# Patient Record
Sex: Male | Born: 1960
Health system: Southern US, Community
[De-identification: ages and names within clinical notes are randomized; demographics above are authoritative.]

## PROBLEM LIST (undated history)

## (undated) DIAGNOSIS — J189 Pneumonia, unspecified organism: Secondary | ICD-10-CM

## (undated) DIAGNOSIS — I10 Essential (primary) hypertension: Secondary | ICD-10-CM

## (undated) DIAGNOSIS — M1990 Unspecified osteoarthritis, unspecified site: Secondary | ICD-10-CM

## (undated) DIAGNOSIS — E119 Type 2 diabetes mellitus without complications: Secondary | ICD-10-CM

## (undated) HISTORY — PX: COLONOSCOPY: SHX174

---

## 2003-04-22 ENCOUNTER — Emergency Department (HOSPITAL_COMMUNITY): Admission: EM | Admit: 2003-04-22 | Discharge: 2003-04-22 | Payer: Self-pay | Admitting: Emergency Medicine

## 2003-11-28 ENCOUNTER — Emergency Department (HOSPITAL_COMMUNITY): Admission: EM | Admit: 2003-11-28 | Discharge: 2003-11-29 | Payer: Self-pay | Admitting: Emergency Medicine

## 2003-12-01 ENCOUNTER — Ambulatory Visit: Payer: Self-pay | Admitting: Internal Medicine

## 2005-08-28 ENCOUNTER — Emergency Department (HOSPITAL_COMMUNITY): Admission: EM | Admit: 2005-08-28 | Discharge: 2005-08-28 | Payer: Self-pay | Admitting: Emergency Medicine

## 2005-10-05 ENCOUNTER — Emergency Department (HOSPITAL_COMMUNITY): Admission: EM | Admit: 2005-10-05 | Discharge: 2005-10-05 | Payer: Self-pay | Admitting: Emergency Medicine

## 2009-02-18 ENCOUNTER — Emergency Department (HOSPITAL_COMMUNITY): Admission: EM | Admit: 2009-02-18 | Discharge: 2009-02-18 | Payer: Self-pay | Admitting: Emergency Medicine

## 2009-03-08 ENCOUNTER — Emergency Department (HOSPITAL_COMMUNITY): Admission: EM | Admit: 2009-03-08 | Discharge: 2009-03-08 | Payer: Self-pay | Admitting: Emergency Medicine

## 2011-01-13 IMAGING — CR DG KNEE COMPLETE 4+V*R*
4 series · 4 of 4 positions shown · non-contrast
Comparison: None.

CLINICAL DATA: Knee injury.  Knee pain and swelling.

RIGHT KNEE - COMPLETE 4+ VIEW

[t knee ap right]
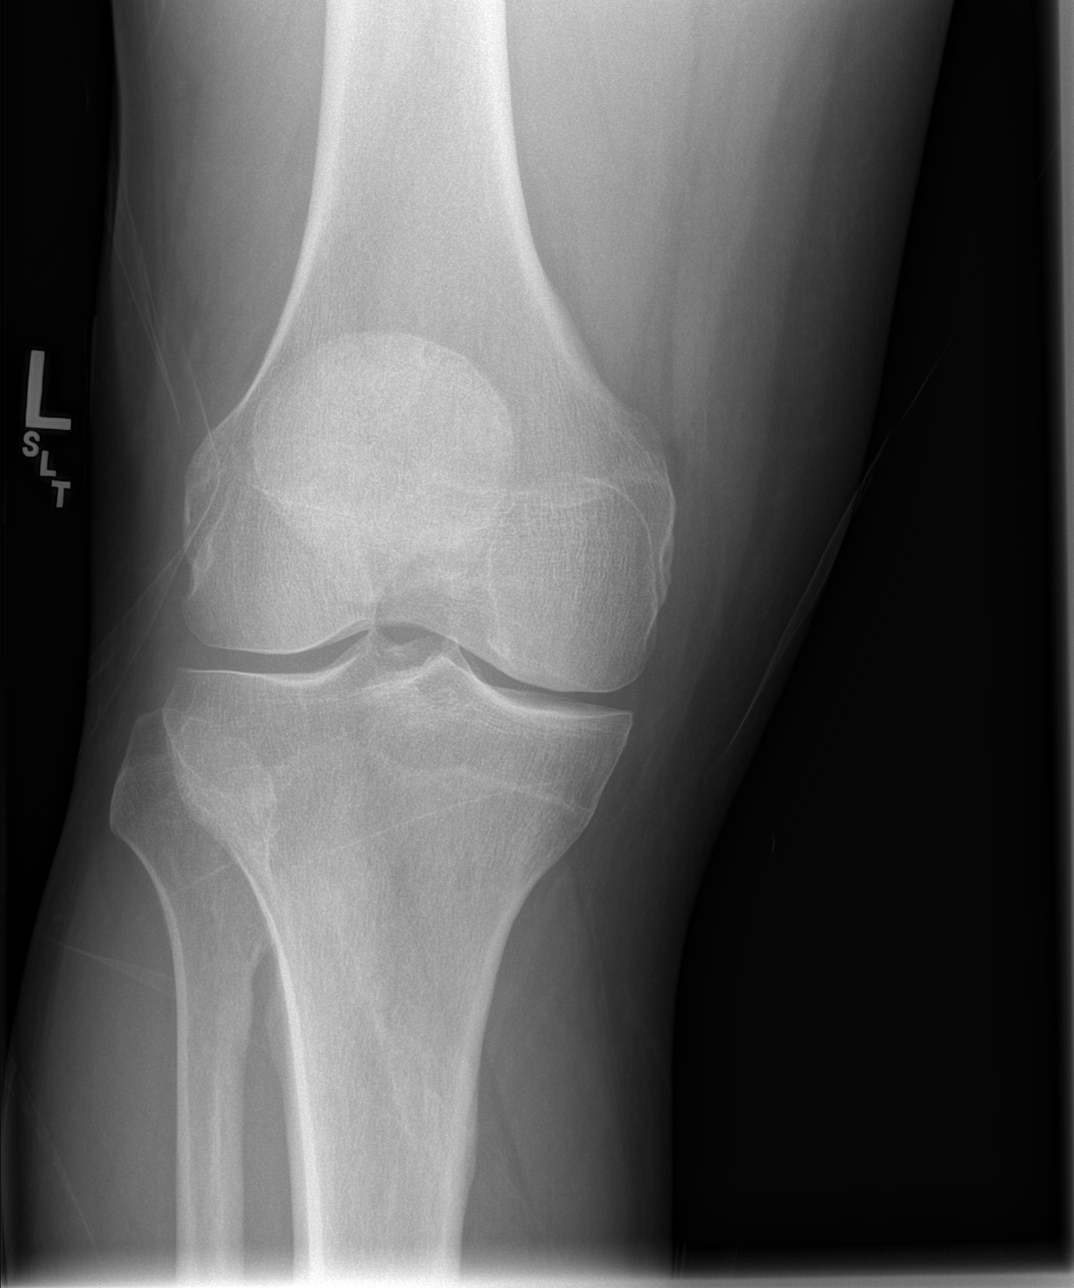

[t knee oblique right (1 of 2)]
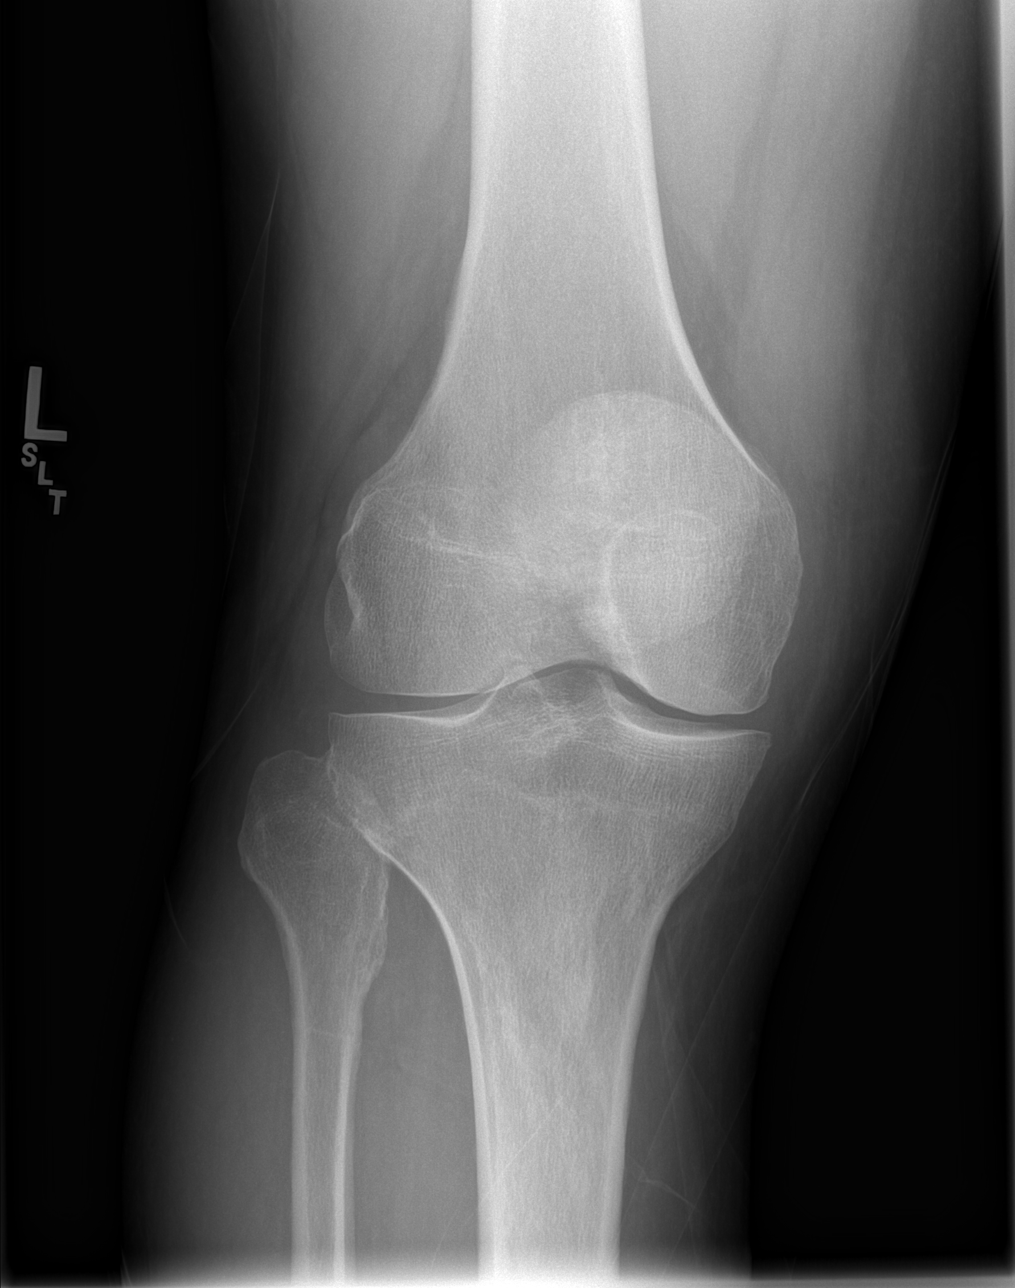

[t knee oblique right (2 of 2)]
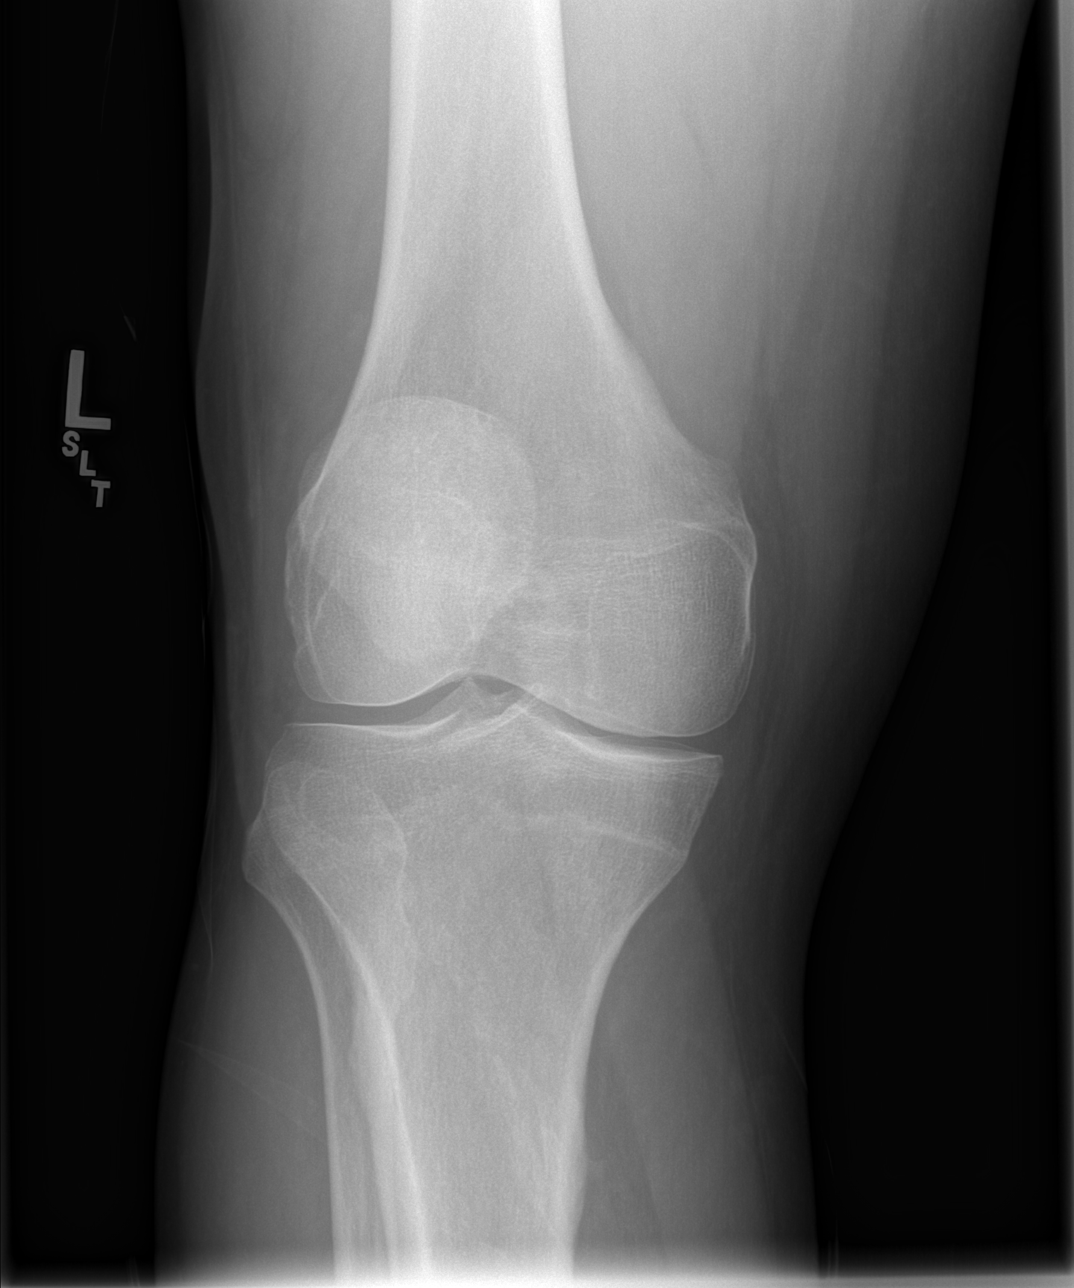

[t knee lat right]
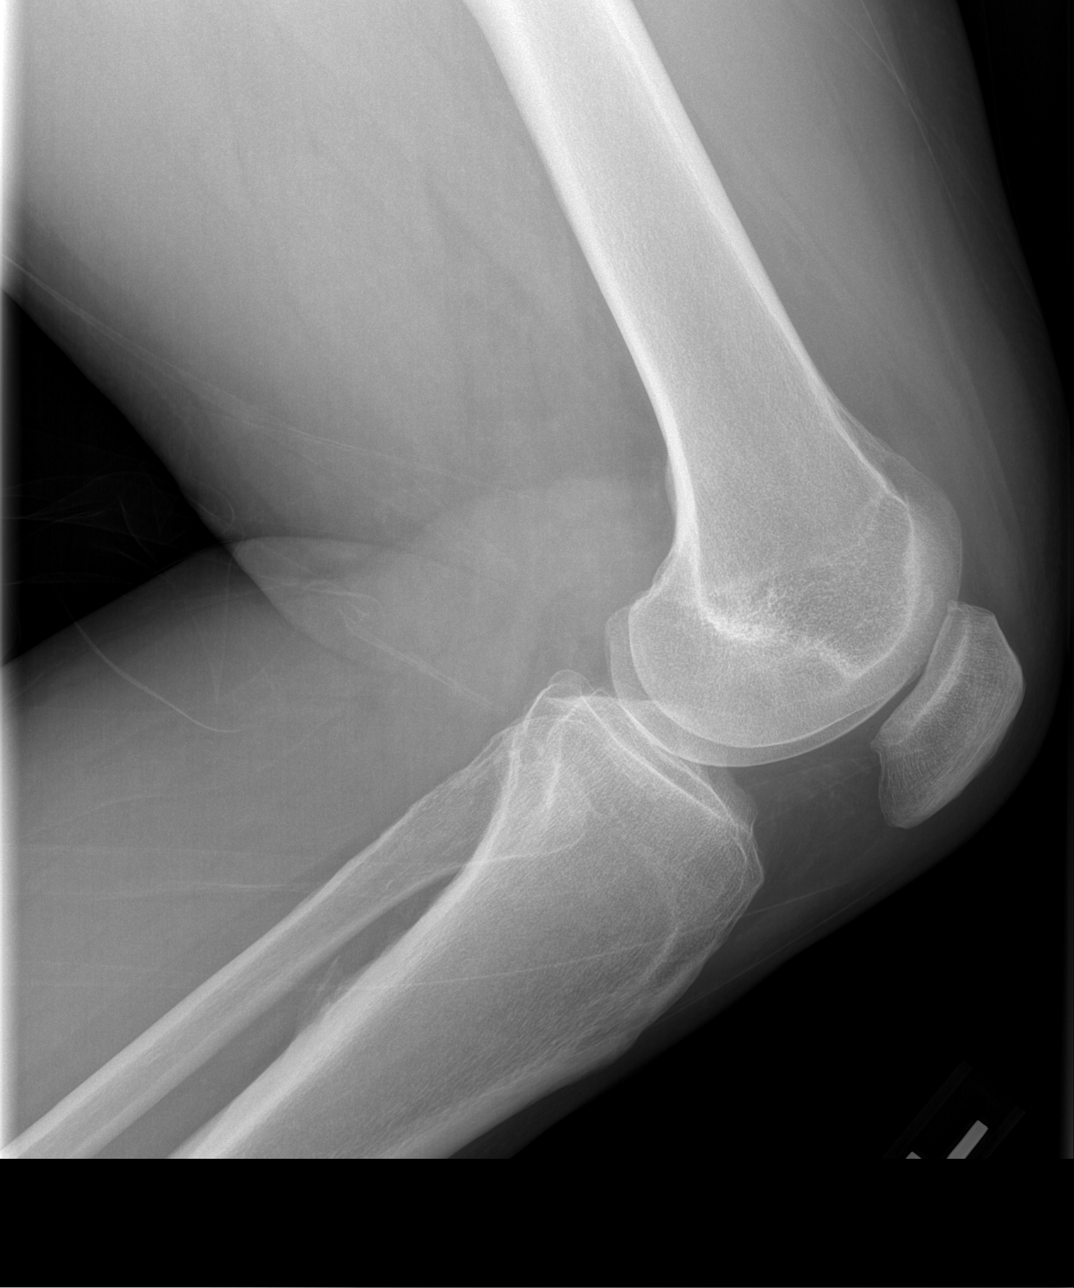

[4 of 4 positions shown; findings below may reference images not displayed]

FINDINGS: There is no evidence of fracture, dislocation, or joint
effusion.  There is no evidence of arthropathy or other focal bone
abnormality.  Soft tissues are unremarkable.
IMPRESSION: Negative.

## 2011-12-25 ENCOUNTER — Emergency Department (HOSPITAL_COMMUNITY): Admission: EM | Admit: 2011-12-25 | Discharge: 2011-12-25 | Discharge status: Absent without leave | Payer: Self-pay

## 2011-12-25 ENCOUNTER — Emergency Department (HOSPITAL_COMMUNITY)
Admission: EM | Admit: 2011-12-25 | Discharge: 2011-12-25 | Discharge status: Absent without leave | Payer: Self-pay | Source: Home / Self Care

## 2011-12-28 ENCOUNTER — Encounter (HOSPITAL_COMMUNITY): Payer: Self-pay

## 2011-12-28 ENCOUNTER — Emergency Department (INDEPENDENT_AMBULATORY_CARE_PROVIDER_SITE_OTHER)
Admission: EM | Admit: 2011-12-28 | Discharge: 2011-12-28 | Disposition: A | Discharge status: Home / Self Care | Payer: Self-pay | Source: Home / Self Care

## 2011-12-28 DIAGNOSIS — Z0000 Encounter for general adult medical examination without abnormal findings: Secondary | ICD-10-CM

## 2011-12-28 DIAGNOSIS — I10 Essential (primary) hypertension: Secondary | ICD-10-CM

## 2011-12-28 LAB — HEMOGLOBIN A1C
Hgb A1c MFr Bld: 5.8 % — ABNORMAL HIGH (ref <5.7)
Mean Plasma Glucose: 120 mg/dL — ABNORMAL HIGH (ref <117)

## 2011-12-28 NOTE — ED Notes (Signed)
Patient states here to establish himself with  Primary doctor.

## 2011-12-28 NOTE — ED Provider Notes (Addendum)
History     CSN: [id number]  Arrival date & time 12/28/11  1516   First MD Initiated Contact with Patient 12/28/11 1527      Chief Complaint  Patient presents with  . SPORTSEXAM     HPI 51 year old with male here for a routine physical. 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 Patient informs that he has recently gained several pounds as he has been sedentary. Patient denies any headache, blurry vision, dry mouth, fever, chills, chest pain, palpitations, shortness of breath, abdominal pain, nausea, vomiting, acid reflux does, bowel or urinary symptoms, joint pains, muscle aches, change in appetite. Patient denies any weakness or feeling fatigued. He has not been watching his diet and eating a lot of junk foods and also has not been exercising regularly.  History reviewed. No pertinent past medical history.  History reviewed. No pertinent past surgical history.  No family history on file.  History  Substance Use Topics  . Smoking status: Never Smoker   . Smokeless tobacco: Not on file  . Alcohol Use: Yes      Review of Systems As outlined in history of present illness Allergies  Review of patient's allergies indicates no known allergies.  Home Medications  No current outpatient prescriptions on file.  BP 154/100  Pulse 80  Temp 97.9 F (36.6 C)  Resp 19  SpO2 99%  Physical Exam Middle-aged overweight male in no acute distress HEENT: No pallor, no icterus, moist oral mucosa, no lymphadenopathy, no thyromegaly Chest: Clear to auscultation bilaterally no added sounds signs he is: Normal S1 and S2 no murmurs rub or gallop Abdomen: Soft, nontender, and nondistended bowel sounds present Extremities: Warm, no edema CNS: AAO x3 ED Course  Procedures (including critical care time)  Labs Reviewed - No data to display No results found.  EKG: NSR@75 , LVH   No diagnosis found.  Patient here for routine physical.  He has gained several pounds over past few months and has been living a very sedentary lifestyle. His blood pressure is elevated on exam today. He has been counseled strongly on diet control and regular exercise to reduce weight. He also informs feeling fatigued frequently. He has not been on any medications. His EKG suggests LVH  I will check routine labs for CBC, BMP, hemoglobin A1c, lipid panel. We'll also get a routine EKG. Her provide him with educational resource on low sodium diet  He needs to followup in one week to have his blood work done and follow up with mom the physicians here to have his blood pressure checked and started on blood pressure medication if needed.  MDM  Follow up in one week for blood pressure monitoring and lab evaluation.        Eddie North, MD 12/28/11 1558  Theda Belfast Emmaleah Meroney, MD 12/28/11 1600

## 2012-01-02 ENCOUNTER — Emergency Department (INDEPENDENT_AMBULATORY_CARE_PROVIDER_SITE_OTHER)
Admission: EM | Admit: 2012-01-02 | Discharge: 2012-01-02 | Disposition: A | Discharge status: Home / Self Care | Payer: No Typology Code available for payment source | Source: Home / Self Care

## 2012-01-02 DIAGNOSIS — I10 Essential (primary) hypertension: Secondary | ICD-10-CM

## 2012-01-02 DIAGNOSIS — Z0000 Encounter for general adult medical examination without abnormal findings: Secondary | ICD-10-CM

## 2012-01-02 LAB — LIPID PANEL
Cholesterol: 196 mg/dL (ref 0–200)
HDL: 37 mg/dL — ABNORMAL LOW (ref >39)
LDL Cholesterol: 130 mg/dL — ABNORMAL HIGH (ref 0–99)
Total CHOL/HDL Ratio: 5.3 RATIO
Triglycerides: 143 mg/dL (ref <150)
VLDL: 29 mg/dL (ref 0–40)

## 2012-01-02 LAB — BASIC METABOLIC PANEL
BUN: 12 mg/dL (ref 6–23)
CO2: 25 mEq/L (ref 19–32)
Calcium: 9.2 mg/dL (ref 8.4–10.5)
Chloride: 99 mEq/L (ref 96–112)
Creatinine, Ser: 0.69 mg/dL (ref 0.50–1.35)
GFR calc Af Amer: >90 mL/min (ref >90)
GFR calc non Af Amer: >90 mL/min (ref >90)
Glucose, Bld: 108 mg/dL — ABNORMAL HIGH (ref 70–99)
Potassium: 3.9 mEq/L (ref 3.5–5.1)
Sodium: 134 mEq/L — ABNORMAL LOW (ref 135–145)

## 2012-01-02 LAB — CBC
HCT: 44.8 % (ref 39.0–52.0)
Hemoglobin: 15.2 g/dL (ref 13.0–17.0)
MCH: 31.9 pg (ref 26.0–34.0)
MCHC: 33.9 g/dL (ref 30.0–36.0)
MCV: 93.9 fL (ref 78.0–100.0)
Platelets: 188 10*3/uL (ref 150–400)
RBC: 4.77 MIL/uL (ref 4.22–5.81)
RDW: 11.5 % (ref 11.5–15.5)
WBC: 4.7 10*3/uL (ref 4.0–10.5)

## 2012-01-02 NOTE — ED Notes (Signed)
Patient was here for bloodwork only

## 2012-03-10 ENCOUNTER — Encounter (HOSPITAL_COMMUNITY): Payer: Self-pay

## 2012-03-10 ENCOUNTER — Emergency Department (INDEPENDENT_AMBULATORY_CARE_PROVIDER_SITE_OTHER)
Admission: EM | Admit: 2012-03-10 | Discharge: 2012-03-10 | Disposition: A | Discharge status: Home / Self Care | Payer: No Typology Code available for payment source | Source: Home / Self Care

## 2012-03-10 DIAGNOSIS — I10 Essential (primary) hypertension: Secondary | ICD-10-CM

## 2012-03-10 DIAGNOSIS — R7309 Other abnormal glucose: Secondary | ICD-10-CM

## 2012-03-10 DIAGNOSIS — R7303 Prediabetes: Secondary | ICD-10-CM

## 2012-03-10 MED ORDER — HYDROCHLOROTHIAZIDE 12.5 MG PO CAPS: 12.5000 mg | ORAL_CAPSULE | Freq: Every day | ORAL | Status: DC

## 2012-03-10 NOTE — ED Provider Notes (Addendum)
History     CSN: [id number]  Arrival date & time 03/10/12  1517   First MD Initiated Contact with Patient 03/10/12 1636      Chief Complaint  Patient presents with  . Follow-up    (Consider location/radiation/quality/duration/timing/severity/associated sxs/prior treatment) HPI  52 year old male who was seen 2 months back in the clinic by me for a routine physical and was found to have elevated blood pressure returns for a followup visit. Patient had blood works done at that time as he is the pressure was quite high and EKG showed signs of LVH. Patient was counseled on low-salt diet and dietary restriction with regular exercise during the visit. He informs that he has tried to cutdown on the salt intake and avoid fatty and fried foods has not succeeded much. His blood pressure is still elevated today. Patient denies any headache, dizziness, blurry vision, chest pain, palpitations, shortness of breath, abdominal pain, nausea, vomiting, bowel or urinary symptoms. Denies any tingling or numbness in his extremities. History reviewed. No pertinent past medical history.  History reviewed. No pertinent past surgical history.  No family history on file.  History  Substance Use Topics  . Smoking status: Never Smoker   . Smokeless tobacco: Not on file  . Alcohol Use: Yes      Review of Systems  Allergies  Review of patient's allergies indicates no known allergies.  Home Medications  No current outpatient prescriptions on file.  BP 159/90  Pulse 97  Temp(Src) 98.1 F (36.7 C) (Oral)  SpO2 98%  Physical Exam Middle aged male in no acute distress HEENT: No pallor, moist oral mucosa Chest: Clear to auscultation bilaterally added sounds CVS: Normal S1 and S2, no murmurs rub or gallop Abdomen: Soft, flabby, nondistended, nontender, bowel sounds present Extremities: Warm, no edema CNS: AAO x3 ED Course  Procedures (including critical care time)  Labs Reviewed - No data to  display No results found.   No diagnosis found.  Assessment  Hypertension Patient has essential hypertension on a subsequent visit. He had LVH on EKG during last evaluation and I had asked him to come for a followup visit within a week time however he did not return at that time. He has tried some dietary restriction without much success. He informs walking 1-2 few times a week. I will start him on HCTZ 12.5 mg daily and have him follow up in the clinic in a week time to monitor his blood pressure. I also instructed him to have his blood pressure checked at [employer] on it the local grocery store is 2 times until next week and keep a log off it. Strongly counseled him to avoid added salt in his diet, avoiding fatty foods, junk foods and avoiding preserved and can foods which are very high sodium content. I will provide him with with some gestational resources well I have encouraged him to exercise  at least 30 minutes every day and try to reduce some weight.  pre diabetes Patient's hemoglobin A1c recently checked was 5.8. I have instructed him taking diet low in carbohydrates and avoiding sweetened beverages, junk foods and limiting fat intake in diet. Patient LDL is 130, no intervention at this time. will repeat lipid panel in 1 month. Patient encouraged to loose weight.    MDM  Followup in one week for blood pressure monitoring        Karely Hurtado, MD 03/10/12 1703  Theda Belfast Irene Mitcham, MD 03/10/12 1704

## 2012-03-10 NOTE — ED Notes (Signed)
Follow up visit Wants a list of proper foods to be eating

## 2012-07-25 ENCOUNTER — Telehealth: Payer: Self-pay

## 2012-07-25 ENCOUNTER — Ambulatory Visit: Payer: No Typology Code available for payment source | Attending: Family Medicine | Admitting: Family Medicine

## 2012-07-25 VITALS — BP 153/87 | HR 72 | Temp 98.1°F | Resp 16 | Ht 71.0 in | Wt 300.2 lb

## 2012-07-25 DIAGNOSIS — R7303 Prediabetes: Secondary | ICD-10-CM

## 2012-07-25 DIAGNOSIS — R030 Elevated blood-pressure reading, without diagnosis of hypertension: Secondary | ICD-10-CM

## 2012-07-25 DIAGNOSIS — E119 Type 2 diabetes mellitus without complications: Secondary | ICD-10-CM | POA: Insufficient documentation

## 2012-07-25 DIAGNOSIS — E8881 Metabolic syndrome: Secondary | ICD-10-CM

## 2012-07-25 DIAGNOSIS — Z09 Encounter for follow-up examination after completed treatment for conditions other than malignant neoplasm: Secondary | ICD-10-CM | POA: Insufficient documentation

## 2012-07-25 DIAGNOSIS — R7309 Other abnormal glucose: Secondary | ICD-10-CM

## 2012-07-25 LAB — COMPLETE METABOLIC PANEL WITH GFR
ALT: 34 U/L (ref 0–53)
AST: 24 U/L (ref 0–37)
Albumin: 4.2 g/dL (ref 3.5–5.2)
Alkaline Phosphatase: 69 U/L (ref 39–117)
Calcium: 9.3 mg/dL (ref 8.4–10.5)
Chloride: 106 mEq/L (ref 96–112)
GFR, Est Non African American: >89 mL/min
Sodium: 140 mEq/L (ref 135–145)
Total Bilirubin: 0.5 mg/dL (ref 0.3–1.2)
Total Protein: 7.4 g/dL (ref 6.0–8.3)

## 2012-07-25 LAB — CBC
Hemoglobin: 13.5 g/dL (ref 13.0–17.0)
MCH: 32.3 pg (ref 26.0–34.0)
MCHC: 35.7 g/dL (ref 30.0–36.0)
MCV: 90.4 fL (ref 78.0–100.0)
Platelets: 256 10*3/uL (ref 150–400)
RDW: 12.8 % (ref 11.5–15.5)

## 2012-07-25 LAB — LIPID PANEL
Cholesterol: 169 mg/dL (ref 0–200)
HDL: 26 mg/dL — ABNORMAL LOW (ref >39)
LDL Cholesterol: 127 mg/dL — ABNORMAL HIGH (ref 0–99)
Total CHOL/HDL Ratio: 6.5 Ratio
VLDL: 16 mg/dL (ref 0–40)

## 2012-07-25 MED ORDER — HYDROCHLOROTHIAZIDE 12.5 MG PO CAPS: 12.5000 mg | ORAL_CAPSULE | Freq: Every day | ORAL | Status: DC

## 2012-07-25 NOTE — Telephone Encounter (Signed)
hemocult kid mailed to patient

## 2012-07-25 NOTE — Telephone Encounter (Signed)
Message copied by Lestine Mount on Fri Jul 25, 2012  4:59 PM ------      Message from: Cleora Fleet      Created: Fri Jul 25, 2012  2:57 PM      Regarding: hemoccult card kit for home       Please get patient a hemoccult card kit to complete at home and bring back in as a colon cancer screening.                   Rodney Langton, MD, CDE, FAAFP      [facility]      [facility]      [place], [place]        ------

## 2012-07-25 NOTE — Progress Notes (Signed)
Patient ID: Brandon Finley, male   DOB: 08-18-1960, 52 y.o.   MRN: [id number]  CC:   Complete physical   HPI: Pt says he had a tetanus booster 2 years ago at another clinic.  Pt says he wants to lose weight and live better.   Pt says that he has started eating better.  He stopped taking his BP meds several months ago.  Pt wants to have a prostate exam done.  Pt needs to have a screening colonoscopy done.   No Known Allergies History reviewed. No pertinent past medical history. No current outpatient prescriptions on file prior to visit.   No current facility-administered medications on file prior to visit.   Family History  Problem Relation Age of Onset  . Hypertension Father    History   Social History  . Marital Status: Single    Spouse Name: N/A    Number of Children: 1  . Years of Education: 12    Occupational History  . Warehouse Worker     Social History Main Topics  . Smoking status: Never Smoker   . Smokeless tobacco: Not on file  . Alcohol Use: No  . Drug Use: No  . Sexually Active: No   Other Topics Concern  . Not on file   Social History Narrative  . No narrative on file    Review of Systems  Constitutional: Negative for fever, chills, diaphoresis, activity change, appetite change and fatigue.  HENT: Negative for ear pain, nosebleeds, congestion, facial swelling, rhinorrhea, neck pain, neck stiffness and ear discharge.   Eyes: Negative for pain, discharge, redness, itching and visual disturbance.  Respiratory: Negative for cough, choking, chest tightness, shortness of breath, wheezing and stridor.   Cardiovascular: Negative for chest pain, palpitations and leg swelling.  Gastrointestinal: Negative for abdominal distention.  Genitourinary: Negative for dysuria, urgency, frequency, hematuria, flank pain, decreased urine volume, difficulty urinating and dyspareunia.  Musculoskeletal: Negative for back pain, joint swelling, arthralgias and gait problem.   Neurological: Negative for dizziness, tremors, seizures, syncope, facial asymmetry, speech difficulty, weakness, light-headedness, numbness and headaches.  Hematological: Negative for adenopathy. Does not bruise/bleed easily.  Psychiatric/Behavioral: Negative for hallucinations, behavioral problems, confusion, dysphoric mood, decreased concentration and agitation.    Objective:   Filed Vitals:   07/25/12 1020  BP: 153/87  Pulse: 72  Temp: 98.1 F (36.7 C)  Resp: 16    Physical Exam  Constitutional: Appears well-developed and well-nourished. No distress.  HENT: Normocephalic. External right and left ear normal. Oropharynx is clear and moist.  Eyes: Conjunctivae and EOM are normal. PERRLA, no scleral icterus.  Neck: Normal ROM. Neck supple. No JVD. No tracheal deviation. No thyromegaly.  CVS: RRR, S1/S2 +, no murmurs, no gallops, no carotid bruit.  Pulmonary: Effort and breath sounds normal, no stridor, rhonchi, wheezes, rales.  Abdominal: Soft. BS +,  no distension, tenderness, rebound or guarding.  Musculoskeletal: Normal range of motion. No edema and no tenderness.  Lymphadenopathy: No lymphadenopathy noted, cervical, inguinal. Neuro: Alert. Normal reflexes, muscle tone coordination. No cranial nerve deficit. Skin: Skin is warm and dry. No rash noted. Not diaphoretic. No erythema. No pallor.  Psychiatric: Normal mood and affect. Behavior, judgment, thought content normal.   Lab Results  Component Value Date   WBC 4.7 01/02/2012   HGB 15.2 01/02/2012   HCT 44.8 01/02/2012   MCV 93.9 01/02/2012   PLT 188 01/02/2012   Lab Results  Component Value Date   CREATININE 0.69 01/02/2012   BUN  12 01/02/2012   NA 134* 01/02/2012   K 3.9 01/02/2012   CL 99 01/02/2012   CO2 25 01/02/2012    Lab Results  Component Value Date   HGBA1C 5.8* 12/28/2011   Lipid Panel     Component Value Date/Time   CHOL 196 01/02/2012 1100   TRIG 143 01/02/2012 1100   HDL 37* 01/02/2012 1100    CHOLHDL 5.3 01/02/2012 1100   VLDL 29 01/02/2012 1100   LDLCALC 130* 01/02/2012 1100       Assessment and plan:   Patient Active Problem List   Diagnosis Date Noted  . Prediabetes 07/25/2012  . Elevated blood pressure 07/25/2012  . Morbid obesity 07/25/2012  . Metabolic syndrome 07/25/2012   Pt was encouraged to start taking his blood pressure medications again.  Gave new prescription for HCTZ 12.5 mg po daily.    Check labs today.    Follow lab results  Exercise 10,000 steps per day... Goal weight loss 20 pounds  The patient was given clear instructions to go to ER or return to medical center if symptoms don't improve, worsen or new problems develop.  The patient verbalized understanding.  The patient was told to call to get any lab results if not heard anything in the next week.    Follow up for BP check in 2 weeks, OV in 3 months  Rodney Langton, MD, CDE, FAAFP [facility] [place], [place]

## 2012-07-25 NOTE — Progress Notes (Signed)
Patient here for a physical Would like prostate checked

## 2012-07-25 NOTE — Patient Instructions (Addendum)
Please get a pedometer and start walking 10,000 steps per day to lose weight and get healthier    2400 Calorie Diet for Diabetes Meal Planning The 2400 calorie diet is designed for eating up to 2400 calories each day. Following this diet and making healthy meal choices can help improve overall health. This diet controls blood sugar (glucose) levels and can also lower blood pressure and cholesterol.  SERVING SIZES Measuring foods and serving sizes helps to make sure you are getting the right amount of food. The list below tells how big or small some common serving sizes are.  1 oz.........4 stacked dice.  3 oz........Marland KitchenDeck of cards.  1 tsp.......Marland KitchenTip of little finger.  1 tbs......Marland KitchenMarland KitchenThumb.  2 tbs.......Marland KitchenGolf ball.   cup......Marland KitchenHalf of a fist.  1 cup.......Marland KitchenA fist. GUIDELINES FOR CHOOSING FOODS The goal of this diet is to eat a variety of foods and limit calories to 2400 each day. This can be done by choosing foods that are low in calories and in fat. The diet also suggests eating small amounts of food often. Doing this helps control your blood glucose levels so they do not get too high or low. Each meal or snack should contain a protein food source to help you feel more satisfied and to stabilize your blood glucose. Try to eat about the same amount of food around the same time each day. This includes weekend days, travel days, and days off work. Space your meals about 4 to 5 hours apart and add a snack between them if you wish.  For example, a daily food plan could include breakfast, a morning snack, lunch, dinner, and an evening snack. Healthy meals and snacks include whole grains, vegetables, fruits, lean meats, poultry, fish, and dairy products. As you plan your meals, choose a variety of foods. Choose from the bread and starches, vegetables, fruit, dairy, and meat/protein groups. Examples of foods from each group are listed below with their suggested serving sizes. Use measuring cups and  spoons to become familiar with what a healthy portion looks like. Bread and Starch Each serving equals 15 grams of carbohydrates.  1 slice bread.   bagel.   cup cold cereal (unsweetened).   cup hot cereal or mashed potatoes.  1 small potato (size of a computer mouse).   cup cooked pasta or rice.   English muffin.  1 cup broth-based soup.  3 cups of popcorn.  4 to 6 whole-wheat crackers.   cup cooked beans, peas, or corn. Vegetable Each serving equals 5 grams of carbohydrates.   cup cooked vegetables.  1 cup raw vegetables.   cup tomato or vegetable juice. Fruit Each serving equals 15 grams of carbohydrates.  1 small apple or orange.  1 cup watermelon or strawberries.   cup applesauce (no sugar added).  2 tbs raisins.   banana.   cup canned fruit, packed in water, in its own juice, or sweetened with a sugar substitute.   cup unsweetened fruit juice. Dairy Each serving equals 12 to 15 grams of carbohydrates.  1 cup fat-free milk.  6 oz artificially sweetened yogurt or plain yogurt.  1 cup low-fat buttermilk.  1 cup soy milk. Meat/Protein  1 large egg.  2 to 3 oz meat, poultry, or fish.   cup low-fat cottage cheese.  1 tbs peanut butter.   cup tofu.  1 oz low-fat cheese.   cup canned tuna in water. Fat  1 tsp oil.  1 tsp trans-fat-free margarine.  1 tsp butter.  1 tsp mayonnaise.  2 tbs avocado. SAMPLE 2400 CALORIE DIET PLAN Breakfast  1 English muffin (2 carb servings).  1 scrambled egg.  1 tsp margarine.  1 cup fat-free milk (1 carb serving).  1 large orange (2 carb servings). Morning Snack   cup low-fat cottage cheese.   cup canned peaches in juice (1 carb serving).  1 cup carrot sticks. Lunch  Grilled chicken salad.  2 oz chicken breast.  1 cup romaine lettuce or spinach.   cup diced tomato.   cup shredded carrots.   cup sliced cucumbers.  2 tbs low-fat salad dressing.  2  slices whole-wheat bread (2 carb servings).  1 small apple (1 carb serving).  1 cup fat-free milk (1 carb serving).  15 baked chips ( 1 carb serving). Afternoon Snack  8 reduced fat crackers (2 carb servings).  2 tbs peanut butter. Dinner  3 oz salmon, broiled.  4 small red potatoes, roasted with 1 tsp olive oil and seasoning (3 carb servings).  1 cup green beans.  1 cup strawberries (1 carb serving).  1 cup fat-free milk (1 carb serving). Evening Snack  6 cups air-popped popcorn (2 carb servings).  2 tbs parmesan cheese sprinkled on top.  8 almonds. MEAL PLAN Use this worksheet to help you make a daily meal plan based on the 2400 calorie diet suggestions. The total amount of carbohydrates in your meal or snack is more important than making sure you include all of the food groups at every meal or snack. If you are using this plan to help you control your blood glucose, you may interchange carbohydrate-containing foods (dairy, starches, and fruits). Choose a variety of fresh foods of varying colors and flavors. You can choose from the following foods to build your day's meals:  12 Starches.  4 Vegetables.  4 Fruits.  3 Dairy.  7 oz Meat/Protein.  Up to 8 Fats. Your dietician can use this worksheet to help you decide how many servings and what types of foods are right for you. BREAKFAST Food Group and Servings / Food Choice Starch ____________________________________________________ Dairy _____________________________________________________ Fruit _____________________________________________________ Meat/Protein ______________________________________________ Fat________________________________________________________ LUNCH Food Group and Servings / Food Choice  Starch ___________________________________________________ Meat/Protein _____________________________________________ Vegetable ________________________________________________ Fruit  _____________________________________________________ Dairy ____________________________________________________ Fat_______________________________________________________ [place] Food Group and Servings / Food Choice Starch ___________________________________________________ Meat/Protein _____________________________________________ [place] Food Group and Servings / Food Choice Starch ___________________________________________________ Meat/Protein _____________________________________________ Dairy ____________________________________________________ Vegetable ________________________________________________ Fruit _____________________________________________________ Fat_______________________________________________________ [place] Food Group and Servings / Food Choice Fruit _____________________________________________________ Meat/Protein ______________________________________________ Starch ____________________________________________________ DAILY TOTALS Starch __________________________ Vegetable _______________________ Fruits ___________________________ Dairy ___________________________ Meat/Protein ____________________ Fat _____________________________ Document Released: 07/24/2004 Document Revised: 03/26/2011 Document Reviewed: 11/17/2010 [employer] Patient Information 2014 [employer]. Exercise to Lose Weight Exercise and a healthy diet may help you lose weight. Your doctor may suggest specific exercises. EXERCISE IDEAS AND TIPS  Choose low-cost things you enjoy doing, such as walking, bicycling, or exercising to workout videos.  Take stairs instead of the elevator.  Walk during your lunch break.  Park your car further away from work or school.  Go to a gym or an exercise class.  Start with 5 to 10 minutes of exercise each day. Build up to 30 minutes of exercise 4 to 6 days a week.  Wear shoes with good support and comfortable  clothes.  Stretch before and after working out.  Work out until you breathe harder and your heart beats faster.  Drink extra water when you exercise.  Do not do  so much that you hurt yourself, feel dizzy, or get very short of breath. Exercises that burn about 150 calories:  Running 1  miles in 15 minutes.  Playing volleyball for 45 to 60 minutes.  Washing and waxing a car for 45 to 60 minutes.  Playing touch football for 45 minutes.  Walking 1  miles in 35 minutes.  Pushing a stroller 1  miles in 30 minutes.  Playing basketball for 30 minutes.  Raking leaves for 30 minutes.  Bicycling 5 miles in 30 minutes.  Walking 2 miles in 30 minutes.  Dancing for 30 minutes.  Shoveling snow for 15 minutes.  Swimming laps for 20 minutes.  Walking up stairs for 15 minutes.  Bicycling 4 miles in 15 minutes.  Gardening for 30 to 45 minutes.  Jumping rope for 15 minutes.  Washing windows or floors for 45 to 60 minutes. Document Released: 02/03/2010 Document Revised: 03/26/2011 Document Reviewed: 02/03/2010 [facility] Patient Information 2014 [place], [place]. Metabolic Syndrome, Adult Metabolic syndrome descibes a group of risk factors for heart disease and diabetes. This syndrome has other names including Insulin Resistance Syndrome. The more risk factors you have, the higher your risk of having a heart attack, stroke, or developing diabetes. These risk factors include:  High blood sugar.  High blood triglyceride (a fat found in the blood) level.  High blood pressure.  Abdominal obesity (your extra weight is around your waist instead of your hips).  Low levels of high-density lipoprotein, HDL (good blood cholesterol). If you have any three of these risk factors, you have metabolic syndrome. If you have even one of these factors, you should make lifestyle changes to improve your health in order to prevent serious health diseases.  In people with metabolic syndrome, the  cells do not respond properly to insulin. This can lead to high levels of glucose in the blood, which can interfere with normal body processes. Eventually, this can cause high blood pressure and higher fat levels in the blood, and inflammation of your blood vessels. The result can be heart disease and stroke.  CAUSES   Eating a diet rich in calories and saturated fat.  Too little physical activity.  Being overweight. Other underlying causes are:  Family history (genetics).  Ethnicity (South Asians are at a higher risk).  Older age (your chances of developing metabolic syndrome are higher as you grow older).  Insulin resistance. SYMPTOMS  By itself, metabolic syndrome has no symptoms. However, you might have symptoms of diabetes (high blood sugar) or high blood pressure, such as:  Increased thirst, urination, and tiredness.  Dizzy spells.  Dull headaches that are unusual for you.  Blurred vision.  Nosebleeds. DIAGNOSIS  Your caregiver may make a diagnosis of metabolic syndrome if you have at least three of these factors:  If you are overweight mostly around the waist. This means a waistline greater than 40" in men and more than 35" in women. The waistline limits are 31 to 35 inches for women and 37 to 39 inches for men. In those who have certain genetic risk factors, such as having a family history of diabetes or being of Asian descent.  If you have a blood pressure of 130/85 mm Hg or more, or if you are being treated for high blood pressure.  If your blood triglyceride level is 150 mg/dL or more, or you are being treated for high levels of triglyceride.  If the level of HDL in your blood is below 40 mg/dL in men,  less than 50 mg/dL in women, or you are receiving treatment for low levels of HDL.  If the level of sugar in your blood is high with fasting blood sugar level of 110 mg/dL or more, or you are under treatment for diabetes. TREATMENT  Your caregiver may have you make  lifestyle changes, which may include:  Exercise.  Losing weight.  Maintaining a healthy diet.  Quitting smoking. The lifestyle changes listed above are key in reducing your risk for heart disease and stroke. Medicines may also be prescribed to help your body respond to insulin better and to reduce your blood pressure and blood fat levels. Aspirin may be recommended to reduce risks of heart disease or stroke.  HOME CARE INSTRUCTIONS   Exercise.  Measure your waist at regular intervals just above the hipbones after you have breathed out.  Maintain a healthy diet.  Eat fruits, such as apples, oranges, and pears.  Eat vegetables.  Eat legumes, such as kidney beans, peas, and lentils.  Eat food rich in soluble fiber, such as whole grain cereal, oatmeal, and oat bran.  Use olive or safflower oils and avoid saturated fats.  Eat nuts.  Limit the amount of salt you eat or add to food.  Limit the amount of alcohol you drink.  Include fish in your diet, if possible.  Stop smoking if you are a smoker.  Maintain regular follow-up appointments.  Follow your caregiver's advice. SEEK MEDICAL CARE IF:   You feel very tired or fatigued.  You develop excessive thirst.  You pass large quantities of urine.  You are putting on weight around your waist rather than losing weight.  You develop headaches over and over again.  You have off-and-on dizzy spells. SEEK IMMEDIATE MEDICAL CARE IF:   You develop nosebleeds.  You develop sudden blurred vision.  You develop sudden dizzy spells.  You develop chest pains, trouble breathing, or feel an abnormal or irregular heart beat.  You have a fainting episode.  You develop any sudden trouble speaking and/or swallowing.  You develop sudden weakness in one arm and/or one leg. MAKE SURE YOU:   Understand these instructions.  Will watch your condition.  Will get help right away if you are not doing well or get worse. Document  Released: 04/10/2007 Document Revised: 03/26/2011 Document Reviewed: 04/10/2007 [facility] Patient Information 2014 [place], [place]. DASH Diet The DASH diet stands for "Dietary Approaches to Stop Hypertension." It is a healthy eating plan that has been shown to reduce high blood pressure (hypertension) in as little as 14 days, while also possibly providing other significant health benefits. These other health benefits include reducing the risk of breast cancer after menopause and reducing the risk of type 2 diabetes, heart disease, colon cancer, and stroke. Health benefits also include weight loss and slowing kidney failure in patients with chronic kidney disease.  DIET GUIDELINES  Limit salt (sodium). Your diet should contain less than 1500 mg of sodium daily.  Limit refined or processed carbohydrates. Your diet should include mostly whole grains. Desserts and added sugars should be used sparingly.  Include small amounts of heart-healthy fats. These types of fats include nuts, oils, and tub margarine. Limit saturated and trans fats. These fats have been shown to be harmful in the body. CHOOSING FOODS  The following food groups are based on a 2000 calorie diet. See your Registered Dietitian for individual calorie needs. Grains and Grain Products (6 to 8 servings daily)  Eat More Often: Whole-wheat bread, brown  rice, whole-grain or wheat pasta, quinoa, popcorn without added fat or salt (air popped).  Eat Less Often: White bread, white pasta, white rice, cornbread. Vegetables (4 to 5 servings daily)  Eat More Often: Fresh, frozen, and canned vegetables. Vegetables may be raw, steamed, roasted, or grilled with a minimal amount of fat.  Eat Less Often/Avoid: Creamed or fried vegetables. Vegetables in a cheese sauce. Fruit (4 to 5 servings daily)  Eat More Often: All fresh, canned (in natural juice), or frozen fruits. Dried fruits without added sugar. One hundred percent fruit juice ( cup [237 mL]  daily).  Eat Less Often: Dried fruits with added sugar. Canned fruit in light or heavy syrup. [employer], Fish, and Poultry (2 servings or less daily. One serving is 3 to 4 oz [85-114 g]).  Eat More Often: Ninety percent or leaner ground beef, tenderloin, sirloin. Round cuts of beef, chicken breast, [place] breast. All fish. Grill, bake, or broil your meat. Nothing should be fried.  Eat Less Often/Avoid: Fatty cuts of meat, [place], or chicken leg, thigh, or wing. Fried cuts of meat or fish. Dairy (2 to 3 servings)  Eat More Often: Low-fat or fat-free milk, low-fat plain or light yogurt, reduced-fat or part-skim cheese.  Eat Less Often/Avoid: Milk (whole, 2%).Whole milk yogurt. Full-fat cheeses. Nuts, Seeds, and Legumes (4 to 5 servings per week)  Eat More Often: All without added salt.  Eat Less Often/Avoid: Salted nuts and seeds, canned beans with added salt. Fats and Sweets (limited)  Eat More Often: Vegetable oils, tub margarines without trans fats, sugar-free gelatin. Mayonnaise and salad dressings.  Eat Less Often/Avoid: Coconut oils, palm oils, butter, stick margarine, cream, half and half, cookies, candy, pie. FOR MORE INFORMATION The Dash Diet Eating Plan: www.dashdiet.org Document Released: 12/21/2010 Document Revised: 03/26/2011 Document Reviewed: 12/21/2010 [facility] Patient Information 2014 [place], [place]. Hypertension As your heart beats, it forces blood through your arteries. This force is your blood pressure. If the pressure is too high, it is called hypertension (HTN) or high blood pressure. HTN is dangerous because you may have it and not know it. High blood pressure may mean that your heart has to work harder to pump blood. Your arteries may be narrow or stiff. The extra work puts you at risk for heart disease, stroke, and other problems.  Blood pressure consists of two numbers, a higher number over a lower, 110/72, for example. It is stated as "110 over 72." The ideal  is below 120 for the top number (systolic) and under 80 for the bottom (diastolic). Write down your blood pressure today. You should pay close attention to your blood pressure if you have certain conditions such as:  Heart failure.  Prior heart attack.  Diabetes  Chronic kidney disease.  Prior stroke.  Multiple risk factors for heart disease. To see if you have HTN, your blood pressure should be measured while you are seated with your arm held at the level of the heart. It should be measured at least twice. A one-time elevated blood pressure reading (especially in the Emergency Department) does not mean that you need treatment. There may be conditions in which the blood pressure is different between your right and left arms. It is important to see your caregiver soon for a recheck. Most people have essential hypertension which means that there is not a specific cause. This type of high blood pressure may be lowered by changing lifestyle factors such as:  Stress.  Smoking.  Lack of exercise.  Excessive weight.  Drug/tobacco/alcohol use.  Eating less salt. Most people do not have symptoms from high blood pressure until it has caused damage to the body. Effective treatment can often prevent, delay or reduce that damage. TREATMENT  When a cause has been identified, treatment for high blood pressure is directed at the cause. There are a large number of medications to treat HTN. These fall into several categories, and your caregiver will help you select the medicines that are best for you. Medications may have side effects. You should review side effects with your caregiver. If your blood pressure stays high after you have made lifestyle changes or started on medicines,   Your medication(s) may need to be changed.  Other problems may need to be addressed.  Be certain you understand your prescriptions, and know how and when to take your medicine.  Be sure to follow up with your  caregiver within the time frame advised (usually within two weeks) to have your blood pressure rechecked and to review your medications.  If you are taking more than one medicine to lower your blood pressure, make sure you know how and at what times they should be taken. Taking two medicines at the same time can result in blood pressure that is too low. SEEK IMMEDIATE MEDICAL CARE IF:  You develop a severe headache, blurred or changing vision, or confusion.  You have unusual weakness or numbness, or a faint feeling.  You have severe chest or abdominal pain, vomiting, or breathing problems. MAKE SURE YOU:   Understand these instructions.  Will watch your condition.  Will get help right away if you are not doing well or get worse. Document Released: 01/01/2005 Document Revised: 03/26/2011 Document Reviewed: 08/22/2007 [facility] Patient Information 2014 [place], [place].

## 2012-07-26 LAB — VITAMIN D 25 HYDROXY (VIT D DEFICIENCY, FRACTURES): Vit D, 25-Hydroxy: 29 ng/mL — ABNORMAL LOW (ref 30–89)

## 2012-07-28 ENCOUNTER — Telehealth: Payer: Self-pay

## 2012-07-28 NOTE — Progress Notes (Signed)
Quick Note:  Please inform patient that labs came back OK.   C. L. Johnson, MD, CDE, FAAFP [facility] [employer] [place], [place]   ______ 

## 2012-07-28 NOTE — Telephone Encounter (Signed)
Left message on machine.

## 2012-07-28 NOTE — Telephone Encounter (Signed)
Message copied by Lestine Mount on Mon Jul 28, 2012  8:38 AM ------      Message from: Cleora Fleet      Created: Mon Jul 28, 2012  7:17 AM       Please inform patient that labs came back OK.              Rodney Langton, MD, CDE, FAAFP      [facility]      [facility]      [place], [place]        ------

## 2012-07-30 ENCOUNTER — Ambulatory Visit: Payer: No Typology Code available for payment source

## 2012-08-15 ENCOUNTER — Ambulatory Visit: Payer: Self-pay

## 2012-08-22 ENCOUNTER — Ambulatory Visit: Payer: Self-pay

## 2012-10-08 ENCOUNTER — Encounter: Payer: Self-pay | Admitting: *Deleted

## 2013-12-07 ENCOUNTER — Ambulatory Visit: Payer: Self-pay

## 2014-01-12 ENCOUNTER — Ambulatory Visit: Payer: Self-pay

## 2014-05-14 ENCOUNTER — Encounter: Payer: Self-pay | Admitting: Internal Medicine

## 2014-07-20 ENCOUNTER — Encounter: Payer: Self-pay | Admitting: Gastroenterology

## 2014-07-23 ENCOUNTER — Encounter: Payer: Self-pay | Admitting: Internal Medicine

## 2014-08-20 ENCOUNTER — Encounter: Payer: Self-pay | Admitting: Gastroenterology

## 2014-08-25 ENCOUNTER — Encounter: Payer: Self-pay | Admitting: Gastroenterology

## 2014-09-28 ENCOUNTER — Encounter: Payer: Self-pay | Admitting: Gastroenterology

## 2015-05-16 ENCOUNTER — Ambulatory Visit: Payer: Self-pay

## 2015-05-18 ENCOUNTER — Ambulatory Visit: Payer: Self-pay | Attending: Internal Medicine

## 2015-05-27 ENCOUNTER — Ambulatory Visit: Payer: Self-pay

## 2015-06-06 ENCOUNTER — Ambulatory Visit: Payer: Self-pay

## 2015-06-15 ENCOUNTER — Ambulatory Visit: Payer: Self-pay

## 2015-06-27 ENCOUNTER — Ambulatory Visit: Payer: Self-pay

## 2015-07-06 ENCOUNTER — Ambulatory Visit: Payer: Self-pay

## 2015-08-01 ENCOUNTER — Emergency Department (HOSPITAL_COMMUNITY)
Admission: EM | Admit: 2015-08-01 | Discharge: 2015-08-01 | Disposition: A | Discharge status: Home / Self Care | Payer: Self-pay | Attending: Emergency Medicine | Admitting: Emergency Medicine

## 2015-08-01 ENCOUNTER — Encounter (HOSPITAL_COMMUNITY): Payer: Self-pay | Admitting: Emergency Medicine

## 2015-08-01 ENCOUNTER — Ambulatory Visit: Payer: Self-pay

## 2015-08-01 DIAGNOSIS — M25551 Pain in right hip: Secondary | ICD-10-CM

## 2015-08-01 MED ORDER — KETOROLAC TROMETHAMINE 30 MG/ML IJ SOLN
30.0000 mg | Freq: Once | INTRAMUSCULAR | Status: AC
  Administered 2015-08-01: 30 mg via INTRAMUSCULAR
  Filled 2015-08-01: qty 1

## 2015-08-01 MED ORDER — NAPROXEN 500 MG PO TABS: 500.0000 mg | ORAL_TABLET | Freq: Two times a day (BID) | ORAL | Status: DC

## 2015-08-01 MED ORDER — DEXAMETHASONE SODIUM PHOSPHATE 10 MG/ML IJ SOLN
10.0000 mg | Freq: Once | INTRAMUSCULAR | Status: AC
  Administered 2015-08-01: 10 mg via INTRAMUSCULAR
  Filled 2015-08-01: qty 1

## 2015-08-01 NOTE — ED Provider Notes (Signed)
History  By signing my name below, I, Brandon Finley, attest that this documentation has been prepared under the direction and in the presence of Sealed Air CorporationHeather Hrithik Boschee, PA-C. Electronically Signed: Earmon PhoenixJennifer Finley, ED Scribe. 08/01/2015. 2:09 PM.  Chief Complaint  Patient presents with  . Hip Pain   The history is provided by the patient and medical records. No language interpreter was used.    HPI Comments:  Brandon Finley is an obese 55 y.o. male who presents to the Emergency Department complaining of sharp right hip pain that has been ongoing for one year. He states the pain has been worsening for the past 6 months and was even worse today. He has taken Tramadol and Ibuprofen with no significant relief. Resting increases his pain and sometimes walking exacerbates the pain as well. He denies alleviating factors. He denies numbness, tingling or weakness of the lower extremities, back pain, fever, chills. Pt is able to ambulate without difficulty or assistance. He denies any trauma, injury or fall. He states he has been diagnosed with arthritis in the right hip by Dr. Bruna Finley. He denies h/o kidney problems.  History reviewed. No pertinent past medical history. History reviewed. No pertinent past surgical history. Family History  Problem Relation Age of Onset  . Hypertension Father    Social History  Substance Use Topics  . Smoking status: Never Smoker   . Smokeless tobacco: None  . Alcohol Use: No    Review of Systems A complete 10 system review of systems was obtained and all systems are negative except as noted in the HPI and PMH.   Allergies  Review of patient's allergies indicates no known allergies.  Home Medications   Prior to Admission medications   Medication Sig Start Date End Date Taking? Authorizing Provider  hydrochlorothiazide (MICROZIDE) 12.5 MG capsule Take 1 capsule (12.5 mg total) by mouth daily. 07/25/12   Clanford Brandon MullL Johnson, MD   Triage Vitals: BP 133/93 mmHg   Pulse 74  Temp(Src) 98.3 F (36.8 C)  Resp 18  Ht 6\' 1"  (1.854 m)  Wt 275 lb (124.739 kg)  BMI 36.29 kg/m2  SpO2 97% Physical Exam  Constitutional: He is oriented to person, place, and time. He appears well-developed and well-nourished.  HENT:  Head: Normocephalic and atraumatic.  Eyes: EOM are normal.  Neck: Normal range of motion.  Cardiovascular: Normal rate, regular rhythm and normal heart sounds.  Exam reveals no gallop and no friction rub.   No murmur heard. DP pulses of right foot 2+.  Pulmonary/Chest: Effort normal and breath sounds normal. No respiratory distress. He has no wheezes. He has no rales.  Musculoskeletal: Normal range of motion.  Tenderness to palpation of right SI joint. No erythema, edema or warmth. Full ROM of right hip. No tenderness to palpation of thoracic or lumbar spine.  Neurological: He is alert and oriented to person, place, and time.  Distal sensations of right foot intact.  Skin: Skin is warm and dry.  Psychiatric: He has a normal mood and affect. His behavior is normal.  Nursing note and vitals reviewed.   ED Course  Procedures (including critical care time) DIAGNOSTIC STUDIES: Oxygen Saturation is 97% on RA, normal by my interpretation.   COORDINATION OF CARE: 2:05 PM- Will give injection of Toradol and Decadron prior to discharge and refer to orthopedist. Pt verbalizes understanding and agrees to plan.  Medications  dexamethasone (DECADRON) injection 10 mg (not administered)  ketorolac (TORADOL) 30 MG/ML injection 30 mg (not administered)  Labs Review Labs Reviewed - No data to display  Imaging Review No results found. I have personally reviewed and evaluated these images and lab results as part of my medical decision-making.   EKG Interpretation None      MDM   Final diagnoses:  None    Patient with right SI joint pain.  No neurological deficits and normal neuro exam.  Patient is ambulatory.  No loss of bowel or  bladder control.  No concern for cauda equina.  No fever, night sweats, weight loss, h/o cancer, IVDA, no recent procedure to back. No urinary symptoms suggestive of UTI.  Supportive care and return precaution discussed. Appears safe for discharge at this time. Follow up as indicated in discharge paperwork.    I personally performed the services described in this documentation, which was scribed in my presence. The recorded information has been reviewed and is accurate.     Brandon Glad, PA-C 08/01/15 1637  Brandon Barrette, MD 08/02/15 [phone]

## 2015-08-01 NOTE — ED Notes (Signed)
Pt reports over a year of right hip pain, denies injury. Pt was seen for this one year ago but did not follow up. Pain 8/10. Pt reports able to walk. 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

## 2015-08-01 NOTE — ED Notes (Signed)
Declined W/C at D/C and was escorted to lobby by RN. 

## 2015-08-05 ENCOUNTER — Encounter (HOSPITAL_COMMUNITY): Payer: Self-pay | Admitting: Emergency Medicine

## 2015-08-05 ENCOUNTER — Emergency Department (HOSPITAL_COMMUNITY)
Admission: EM | Admit: 2015-08-05 | Discharge: 2015-08-05 | Disposition: A | Discharge status: Home / Self Care | Payer: Self-pay | Attending: Emergency Medicine | Admitting: Emergency Medicine

## 2015-08-05 DIAGNOSIS — M1611 Unilateral primary osteoarthritis, right hip: Secondary | ICD-10-CM | POA: Insufficient documentation

## 2015-08-05 DIAGNOSIS — M2550 Pain in unspecified joint: Secondary | ICD-10-CM

## 2015-08-05 DIAGNOSIS — M25551 Pain in right hip: Secondary | ICD-10-CM | POA: Insufficient documentation

## 2015-08-05 DIAGNOSIS — G8929 Other chronic pain: Secondary | ICD-10-CM | POA: Insufficient documentation

## 2015-08-05 HISTORY — DX: Unspecified osteoarthritis, unspecified site: M19.90

## 2015-08-05 MED ORDER — ACETAMINOPHEN 325 MG PO TABS: 650.0000 mg | ORAL_TABLET | Freq: Four times a day (QID) | ORAL | Status: DC | PRN

## 2015-08-05 MED ORDER — KETOROLAC TROMETHAMINE 30 MG/ML IJ SOLN
30.0000 mg | Freq: Once | INTRAMUSCULAR | Status: AC
  Administered 2015-08-05: 30 mg via INTRAMUSCULAR
  Filled 2015-08-05: qty 1

## 2015-08-05 NOTE — Discharge Instructions (Signed)
Follow up with [facility] as soon as you can. Have your primary care provider refer you to an orthopedist.

## 2015-08-05 NOTE — ED Notes (Signed)
Pt c/o R hip pain, pt ambulatory, pt hx of arthritis.

## 2015-08-05 NOTE — ED Provider Notes (Signed)
CSN: [id number]     Arrival date & time 08/05/15  1351 History  By signing my name below, I, Essence Howell, attest that this documentation has been prepared under the direction and in the presence of Noelle PennerSerena Kailo Kosik, PA-C Electronically Signed: Charline BillsEssence Howell, ED Scribe 08/05/2015 at 2:41 PM.   Chief Complaint  Patient presents with  . Hip Pain   The history is provided by the patient. No language interpreter was used.   HPI Comments: Brandon Finley is a 55 y.o. male who presents to the Emergency Department complaining of ongoing sharp, stabbing right hip pain for the past year, worsening over the past few months. Pt denies fall or injury. He was seen in the ED last week for the same. He was treated with Toradol and Decadron injections which he stated provided temporary relief. He did not fill his naproxen rx. Pt was also advised to follow-up with ortho but states that he is unable to until be receives his orange card and has an appointment with the [facility]. Denies numbness, weakness, tingling.  Past Medical History  Diagnosis Date  . Arthritis    History reviewed. No pertinent past surgical history. Family History  Problem Relation Age of Onset  . Hypertension Father    Social History  Substance Use Topics  . Smoking status: Never Smoker   . Smokeless tobacco: None  . Alcohol Use: No    Review of Systems  Musculoskeletal: Positive for arthralgias.  Neurological: Negative for numbness.  All other systems reviewed and are negative.  Allergies  Review of patient's allergies indicates no known allergies.  Home Medications   Prior to Admission medications   Medication Sig Start Date End Date Taking? Authorizing Provider  hydrochlorothiazide (MICROZIDE) 12.5 MG capsule Take 1 capsule (12.5 mg total) by mouth daily. 07/25/12   Clanford Cyndie MullL Johnson, MD  naproxen (NAPROSYN) 500 MG tablet Take 1 tablet (500 mg total) by mouth 2 (two) times daily. 08/01/15   Heather Laisure, PA-C    BP 118/76 mmHg  Pulse 78  Temp(Src) 98.2 F (36.8 C) (Oral)  Resp 16  Ht 6\' 1"  (1.854 m)  Wt 270 lb (122.471 kg)  BMI 35.63 kg/m2  SpO2 96% Physical Exam  Constitutional: He is oriented to person, place, and time. He appears well-developed and well-nourished. No distress.  HENT:  Head: Normocephalic and atraumatic.  Eyes: Conjunctivae and EOM are normal.  Neck: Neck supple. No tracheal deviation present.  Cardiovascular: Normal rate.   Pulmonary/Chest: Effort normal. No respiratory distress.  Musculoskeletal: Normal range of motion.  Lateral R hip tenderness to palpation  No posterior back tenderness No edema or discoloration  2+ peripheral pulses   Neurological: He is alert and oriented to person, place, and time.  Skin: Skin is warm and dry.  Psychiatric: He has a normal mood and affect. His behavior is normal.  Nursing note and vitals reviewed.  ED Course  Procedures (including critical care time) DIAGNOSTIC STUDIES: Oxygen Saturation is 96% on RA, normal by my interpretation.    COORDINATION OF CARE: 2:12 PM-Discussed treatment plan which includes Toradol injection with pt at bedside and pt agreed to plan.   MDM   Final diagnoses:  Arthritic-like pain  Right hip pain    Pt here with acute on chronic right hip pain. He has been diagnosed with arthritis in the past. No new injury or trauma. Will treat with toradol today. encouraegd to pick up the naproxen he was prescribed. Also given rx for  tylenol. Encouraged f/u with [facility] as soon as possible and to obtain ortho referral then. ER return precautions given.   I personally performed the services described in this documentation, which was scribed in my presence. The recorded information has been reviewed and is accurate.   Carlene Coria, PA-C 08/05/15 1450   Loren Racer, MD 08/21/15 [phone]

## 2015-08-05 NOTE — ED Notes (Signed)
See PA note for secondary assessment.   

## 2015-08-08 ENCOUNTER — Ambulatory Visit: Payer: Self-pay

## 2015-08-10 ENCOUNTER — Ambulatory Visit: Payer: Self-pay | Attending: Internal Medicine

## 2015-08-15 ENCOUNTER — Emergency Department (HOSPITAL_COMMUNITY)
Admission: EM | Admit: 2015-08-15 | Discharge: 2015-08-15 | Disposition: A | Discharge status: Home / Self Care | Payer: Self-pay | Attending: Emergency Medicine | Admitting: Emergency Medicine

## 2015-08-15 ENCOUNTER — Encounter (HOSPITAL_COMMUNITY): Payer: Self-pay | Admitting: Emergency Medicine

## 2015-08-15 DIAGNOSIS — Y929 Unspecified place or not applicable: Secondary | ICD-10-CM | POA: Insufficient documentation

## 2015-08-15 DIAGNOSIS — Y999 Unspecified external cause status: Secondary | ICD-10-CM | POA: Insufficient documentation

## 2015-08-15 DIAGNOSIS — Y93F2 Activity, caregiving, lifting: Secondary | ICD-10-CM | POA: Insufficient documentation

## 2015-08-15 DIAGNOSIS — X500XXA Overexertion from strenuous movement or load, initial encounter: Secondary | ICD-10-CM | POA: Insufficient documentation

## 2015-08-15 DIAGNOSIS — S39012A Strain of muscle, fascia and tendon of lower back, initial encounter: Secondary | ICD-10-CM | POA: Insufficient documentation

## 2015-08-15 MED ORDER — HYDROCODONE-ACETAMINOPHEN 5-325 MG PO TABS: 1.0000 | ORAL_TABLET | Freq: Four times a day (QID) | ORAL | 0 refills | Status: DC | PRN

## 2015-08-15 MED ORDER — NAPROXEN 500 MG PO TABS: 500.0000 mg | ORAL_TABLET | Freq: Two times a day (BID) | ORAL | 0 refills | Status: DC

## 2015-08-15 MED ORDER — DEXAMETHASONE SODIUM PHOSPHATE 10 MG/ML IJ SOLN
10.0000 mg | Freq: Once | INTRAMUSCULAR | Status: AC
Start: 2015-08-15 — End: 2015-08-15
  Administered 2015-08-15: 10 mg via INTRAMUSCULAR
  Filled 2015-08-15: qty 1

## 2015-08-15 MED ORDER — CYCLOBENZAPRINE HCL 10 MG PO TABS: 10.0000 mg | ORAL_TABLET | Freq: Two times a day (BID) | ORAL | 0 refills | Status: DC | PRN

## 2015-08-15 MED ORDER — KETOROLAC TROMETHAMINE 60 MG/2ML IM SOLN
60.0000 mg | Freq: Once | INTRAMUSCULAR | Status: AC
  Administered 2015-08-15: 60 mg via INTRAMUSCULAR
  Filled 2015-08-15: qty 2

## 2015-08-15 NOTE — ED Triage Notes (Signed)
Pt reports he was moving furniture on Friday and hurt his back. Pt ambulatory in triage. Pt reports pain to lumbar spine. No obvious defomities or swelling noted.

## 2015-08-15 NOTE — ED Provider Notes (Signed)
[facility] DEPT Provider Note   CSN: [id number] Arrival date & time: 08/15/15  1442  First Provider Contact:  First PA Initiated Contact with Patient 08/15/15 1709     By signing my name below, I, Linna Darner, attest that this documentation has been prepared under the direction and in the presence of Danelle Berry, PA-C. Electronically Signed: Linna Darner, Scribe. 08/15/2015. 5:10 PM.   History   Chief Complaint Chief Complaint  Patient presents with  . Back Pain    The history is provided by the patient. No language interpreter was used.     HPI Comments: Brandon Finley is a 55 y.o. male with PMHx of arthritis who presents to the Emergency Department complaining of sudden onset, constant, worsening, aching, 8/10, midline lower back pain beginning three days ago. Pt reports he lifted heavy furniture three days ago and hurt his lower back. He endorses a sensation of tightness in his lower back. Pt also notes some pain in his bilateral hips since the incident; he states he was diagnosed with right hip arthritis several months ago. Pt states his lower back pain does not shoot down his legs. He states his lower back pain is most significant when he stands up after sitting or laying down. He denies h/o back injury, cancer, IV drug use, or prolonged steroid use. He further denies fever, numbness/tingling, saddle anesthesia, cauda equina symptoms, bowel/bladder incontinence, neuro deficits, or any other associated symptoms. He has an appointment with an orthopedist at [facility] in 2 days for his right hip problems, and intends to follow-up about his lower back pain as well.  Past Medical History:  Diagnosis Date  . Arthritis     Patient Active Problem List   Diagnosis Date Noted  . Prediabetes 07/25/2012  . Elevated blood pressure 07/25/2012  . Morbid obesity (HCC) 07/25/2012  . Metabolic syndrome 07/25/2012    No past surgical history on  file.     Home Medications    Prior to Admission medications   Medication Sig Start Date End Date Taking? Authorizing Provider  acetaminophen (TYLENOL) 325 MG tablet Take 2 tablets (650 mg total) by mouth every 6 (six) hours as needed. 08/05/15   Ace Gins Sam, PA-C  cyclobenzaprine (FLEXERIL) 10 MG tablet Take 1 tablet (10 mg total) by mouth 2 (two) times daily as needed for muscle spasms. 08/15/15   Danelle Berry, PA-C  hydrochlorothiazide (MICROZIDE) 12.5 MG capsule Take 1 capsule (12.5 mg total) by mouth daily. 07/25/12   Clanford Cyndie Mull, MD  HYDROcodone-acetaminophen (NORCO/VICODIN) 5-325 MG tablet Take 1-2 tablets by mouth every 6 (six) hours as needed for severe pain. 08/15/15   Danelle Berry, PA-C  naproxen (NAPROSYN) 500 MG tablet Take 1 tablet (500 mg total) by mouth 2 (two) times daily with a meal. 08/15/15   Danelle Berry, PA-C    Family History Family History  Problem Relation Age of Onset  . Hypertension Father     Social History Social History  Substance Use Topics  . Smoking status: Never Smoker  . Smokeless tobacco: Not on file  . Alcohol use No     Allergies   Review of patient's allergies indicates no known allergies.   Review of Systems Review of Systems  All other systems reviewed and are negative.  Physical Exam Updated Vital Signs BP 141/91 (BP Location: Left Arm)   Pulse 84   Temp 98.6 F (37 C) (Oral)   Resp 18   Ht 6\' 1"  (1.854  m)   Wt 124.7 kg   SpO2 100%   BMI 36.28 kg/m   Physical Exam  Constitutional: He is oriented to person, place, and time. He appears well-developed and well-nourished. No distress.  HENT:  Head: Normocephalic and atraumatic.  Eyes: Conjunctivae and EOM are normal.  Neck: Neck supple. No tracheal deviation present.  Cardiovascular: Normal rate.   Pulmonary/Chest: Effort normal. No respiratory distress.  Musculoskeletal: Normal range of motion.  Low lumbar to sacral region: Paraspinal tenderness. No midline  tenderness. No step off. Normal dorsal and plantar flexion.  Neurological: He is alert and oriented to person, place, and time.  Normal sensation and strength bilaterally in lower extremities.  Skin: Skin is warm and dry.  Psychiatric: He has a normal mood and affect. His behavior is normal.  Nursing note and vitals reviewed.   ED Treatments / Results  Labs (all labs ordered are listed, but only abnormal results are displayed) Labs Reviewed - No data to display  EKG  EKG Interpretation None       Radiology No results found.  Procedures Procedures (including critical care time)  DIAGNOSTIC STUDIES: Oxygen Saturation is 100% on RA, normal by my interpretation.    COORDINATION OF CARE: 5:10 PM Discussed treatment plan with pt at bedside and pt agreed to plan.   Medications Ordered in ED Medications  ketorolac (TORADOL) injection 60 mg (60 mg Intramuscular Given 08/15/15 1803)  dexamethasone (DECADRON) injection 10 mg (10 mg Intramuscular Given 08/15/15 1803)     Initial Impression / Assessment and Plan / ED Course  I have reviewed the triage vital signs and the nursing notes.  Pertinent labs & imaging results that were available during my care of the patient were reviewed by me and considered in my medical decision making (see chart for details).  Clinical Course   Pt with lumbar strain after lifting heavy object three days ago.  Patient with back pain.  No neurological deficits and normal neuro exam.  Patient can walk but states is painful.  No loss of bowel or bladder control.  No concern for cauda equina.  No fever, night sweats, weight loss, h/o cancer, IVDU.  RICE protocol and pain medicine indicated and discussed with patient.  He will follow up at his upcoming appointment.  I personally performed the services described in this documentation, which was scribed in my presence. The recorded information has been reviewed and is accurate.    Final Clinical  Impressions(s) / ED Diagnoses   Final diagnoses:  Lumbar spine strain, initial encounter    New Prescriptions Discharge Medication List as of 08/15/2015  5:19 PM    START taking these medications   Details  cyclobenzaprine (FLEXERIL) 10 MG tablet Take 1 tablet (10 mg total) by mouth 2 (two) times daily as needed for muscle spasms., Starting Mon 08/15/2015, Print    HYDROcodone-acetaminophen (NORCO/VICODIN) 5-325 MG tablet Take 1-2 tablets by mouth every 6 (six) hours as needed for severe pain., Starting Mon 08/15/2015, Print         Danelle Berry, PA-C 08/15/15 1817    Raeford Razor, MD 08/18/15 2150

## 2015-08-15 NOTE — ED Notes (Signed)
Declined W/C at D/C and was escorted to lobby by RN. 

## 2015-08-19 ENCOUNTER — Emergency Department (HOSPITAL_COMMUNITY): Payer: Self-pay

## 2015-08-19 ENCOUNTER — Encounter (HOSPITAL_COMMUNITY): Payer: Self-pay | Admitting: *Deleted

## 2015-08-19 ENCOUNTER — Emergency Department (HOSPITAL_COMMUNITY)
Admission: EM | Admit: 2015-08-19 | Discharge: 2015-08-19 | Disposition: A | Discharge status: Home / Self Care | Payer: Self-pay | Attending: Emergency Medicine | Admitting: Emergency Medicine

## 2015-08-19 DIAGNOSIS — Z87891 Personal history of nicotine dependence: Secondary | ICD-10-CM | POA: Insufficient documentation

## 2015-08-19 DIAGNOSIS — M1611 Unilateral primary osteoarthritis, right hip: Secondary | ICD-10-CM | POA: Insufficient documentation

## 2015-08-19 DIAGNOSIS — M1610 Unilateral primary osteoarthritis, unspecified hip: Secondary | ICD-10-CM

## 2015-08-19 MED ORDER — KETOROLAC TROMETHAMINE 60 MG/2ML IM SOLN
60.0000 mg | Freq: Once | INTRAMUSCULAR | Status: AC
  Administered 2015-08-19: 60 mg via INTRAMUSCULAR
  Filled 2015-08-19: qty 2

## 2015-08-19 MED ORDER — MELOXICAM 15 MG PO TABS: 15.0000 mg | ORAL_TABLET | Freq: Every day | ORAL | 0 refills | Status: DC

## 2015-08-19 MED ORDER — DEXAMETHASONE SODIUM PHOSPHATE 10 MG/ML IJ SOLN
10.0000 mg | Freq: Once | INTRAMUSCULAR | Status: AC
  Administered 2015-08-19: 10 mg via INTRAMUSCULAR
  Filled 2015-08-19: qty 1

## 2015-08-19 MED ORDER — OXYCODONE-ACETAMINOPHEN 5-325 MG PO TABS: 1.0000 | ORAL_TABLET | ORAL | 0 refills | Status: DC | PRN

## 2015-08-19 MED FILL — MELOXICAM 15 MG TABLET: 15 | 30 days supply | Qty: 30 | Fill #0

## 2015-08-19 NOTE — ED Notes (Signed)
Returned from xray

## 2015-08-19 NOTE — ED Triage Notes (Signed)
Pt c/o chronic bil lower back pain & R hip pain, pt reports [facility] has an appt next week for eval, pt ambulatory, pt reports being seen here last wk for similar symptoms, A&O x4

## 2015-08-19 NOTE — ED Notes (Addendum)
Charting error.

## 2015-08-19 NOTE — ED Provider Notes (Signed)
[facility] Provider Note   CSN: [id number] Arrival date & time: 08/19/15  1047  First Provider Contact:  First MD Initiated Contact with Patient 08/19/15 1114     By signing my name below, I, Essence Howell, attest that this documentation has been prepared under the direction and in the presence of Arthor Captain, PA-C Electronically Signed: Charline Bills, ED Scribe 08/19/2015 at 12:43 PM.   History   Chief Complaint No chief complaint on file.  HPI KNIGHTLY MARKELL is a 55 y.o. male, with a h/o arthritis in the right hip, who presents to the Emergency Department complaining of gradually worsening low back and right hip pain for the past year. Pt reports increased pain with bearing weight and movement. No treatments tried PTA. He has tried Toradol and Decadron injections with temporary relief. Pt has an upcoming appointment with an orthopedist at [facility] on 08/30/15 for right hip and low back pain. No h/o DM or HTN.   The history is provided by the patient. No language interpreter was used.   Past Medical History:  Diagnosis Date  . Arthritis     Patient Active Problem List   Diagnosis Date Noted  . Prediabetes 07/25/2012  . Elevated blood pressure 07/25/2012  . Morbid obesity (HCC) 07/25/2012  . Metabolic syndrome 07/25/2012    History reviewed. No pertinent surgical history.   Home Medications    Prior to Admission medications   Medication Sig Start Date End Date Taking? Authorizing Provider  acetaminophen (TYLENOL) 325 MG tablet Take 2 tablets (650 mg total) by mouth every 6 (six) hours as needed. 08/05/15   Ace Gins Sam, PA-C  cyclobenzaprine (FLEXERIL) 10 MG tablet Take 1 tablet (10 mg total) by mouth 2 (two) times daily as needed for muscle spasms. 08/15/15   Danelle Berry, PA-C  hydrochlorothiazide (MICROZIDE) 12.5 MG capsule Take 1 capsule (12.5 mg total) by mouth daily. 07/25/12   Clanford Cyndie Mull, MD  HYDROcodone-acetaminophen  (NORCO/VICODIN) 5-325 MG tablet Take 1-2 tablets by mouth every 6 (six) hours as needed for severe pain. 08/15/15   Danelle Berry, PA-C  naproxen (NAPROSYN) 500 MG tablet Take 1 tablet (500 mg total) by mouth 2 (two) times daily with a meal. 08/15/15   Danelle Berry, PA-C    Family History Family History  Problem Relation Age of Onset  . Hypertension Father     Social History Social History  Substance Use Topics  . Smoking status: Former Smoker    Quit date: 01/16/1995  . Smokeless tobacco: Never Used  . Alcohol use No    Allergies   Review of patient's allergies indicates no known allergies.   Review of Systems Review of Systems  Constitutional: Negative for fever.  Musculoskeletal: Positive for arthralgias and back pain.  Neurological: Negative for numbness.    Physical Exam Updated Vital Signs BP 147/84 (BP Location: Right Arm)   Pulse 71   Temp 97.9 F (36.6 C) (Oral)   Resp 20   Wt 126.8 kg   SpO2 97%   BMI 36.88 kg/m   Physical Exam  Constitutional: He is oriented to person, place, and time. He appears well-developed and well-nourished. No distress.  HENT:  Head: Normocephalic and atraumatic.  Eyes: Conjunctivae and EOM are normal.  Neck: Neck supple. No tracheal deviation present.  Cardiovascular: Normal rate and normal pulses.   Pulmonary/Chest: Effort normal. No respiratory distress.  Musculoskeletal: Normal range of motion.  Limited ROM of R hip due to pain  Neurological: He is alert and oriented to person, place, and time.  Skin: Skin is warm and dry.  Psychiatric: He has a normal mood and affect. His behavior is normal.  Nursing note and vitals reviewed.   ED Treatments / Results  Labs (all labs ordered are listed, but only abnormal results are displayed) Labs Reviewed - No data to display  EKG  EKG Interpretation None       Radiology Dg Lumbar Spine Complete  Result Date: 08/19/2015 CLINICAL DATA:  55 year old male with chronic progressive  pain. Initial encounter. EXAM: LUMBAR SPINE - COMPLETE 4+ VIEW COMPARISON:  CT Abdomen and Pelvis 11/28/2003. Right hip series from today reported separately. FINDINGS: Hip findings are reported separately. Normal lumbar segmentation. Bone mineralization is within normal limits. Diffuse lower thoracic and lumbar degenerative endplate spurring. Lower lumbar disc spaces are fairly preserved. There is lower thoracic and upper lumbar disc space loss. Mild to moderate superimposed facet hypertrophy. No pars fracture. Chronic degenerative SI joint overgrowth. No acute osseous abnormality identified. IMPRESSION: Widespread lower thoracic and lumbar endplate and facet degeneration. No acute osseous abnormality identified. Electronically Signed   By: Odessa Fleming M.D.   On: 08/19/2015 12:16   Dg Hip Unilat W Or Wo Pelvis 2-3 Views Right  Result Date: 08/19/2015 CLINICAL DATA:  55 year old male with progressive chronic right hip pain. Initial encounter. EXAM: DG HIP (WITH OR WITHOUT PELVIS) 2-3V RIGHT COMPARISON:  CT Abdomen and Pelvis 11/28/2003. FINDINGS: Severe right hip joint space loss with acetabular and femoral head subchondral sclerosis and geodes. Moderate associated osteophytosis. The appearance does not strongly suggest avascular necrosis. These changes have progressed since 2005. Proximal right femur intact. Left femoral head normally located. The left hip joint space appears more stable since 2005. Pelvis intact. Chronic degenerative overgrowth at both SI joints appear stable. Pelvic phleboliths. IMPRESSION: 1. Severe right hip osteoarthritis, progressed since 2005. 2. More stable degenerative changes at the left hip and SI joints. Electronically Signed   By: Odessa Fleming M.D.   On: 08/19/2015 12:17   Procedures Procedures (including critical care time) DIAGNOSTIC STUDIES: Oxygen Saturation is 97% on RA, normal by my interpretation.    COORDINATION OF CARE: 12:35 PM-Discussed treatment plan which includes XR  with pt at bedside and pt agreed to plan.   Medications Ordered in ED Medications  ketorolac (TORADOL) injection 60 mg (not administered)  dexamethasone (DECADRON) injection 10 mg (not administered)     Initial Impression / Assessment and Plan / ED Course  I have reviewed the triage vital signs and the nursing notes.  Pertinent labs & imaging results that were available during my care of the patient were reviewed by me and considered in my medical decision making (see chart for details).  Clinical Course    Patient with severe OA of the R hip. Bone on bone degeneration with subchondral sclerosis. Will se an orthopedist on 08/30/2015. Will give NSAIDs, Pain meds to be used sparingly and only when pain is intolerable. Patient needs a cane.   Final Clinical Impressions(s) / ED Diagnoses   Final diagnoses:  Osteoarthritis of one hip, unspecified osteoarthritis type   I reviewed the NCCSRS and patient has no listed medications.  New Prescriptions Discharge Medication List as of 08/19/2015  1:16 PM    START taking these medications   Details  meloxicam (MOBIC) 15 MG tablet Take 1 tablet (15 mg total) by mouth daily. Take 1 daily with food., Starting Fri 08/19/2015, Print    oxyCODONE-acetaminophen (PERCOCET)  5-325 MG tablet Take 1-2 tablets by mouth every 4 (four) hours as needed., Starting Fri 08/19/2015, Print         Arthor Captain, PA-C 08/19/15 1627    Shaune Pollack, MD 08/21/15 [phone]

## 2015-08-19 NOTE — ED Notes (Signed)
Here 7/31 for back pain. Returns today for continued LBP. Has appointment with Ortho 8/15.

## 2015-08-19 NOTE — ED Notes (Signed)
Helped patient get into a gown

## 2015-08-30 ENCOUNTER — Encounter: Payer: Self-pay | Admitting: Internal Medicine

## 2015-08-30 ENCOUNTER — Ambulatory Visit: Payer: Medicaid Other | Attending: Internal Medicine | Admitting: Internal Medicine

## 2015-08-30 VITALS — BP 128/82 | HR 79 | Temp 98.2°F | Resp 16 | Wt 285.4 lb

## 2015-08-30 DIAGNOSIS — M25551 Pain in right hip: Secondary | ICD-10-CM | POA: Diagnosis not present

## 2015-08-30 DIAGNOSIS — H6121 Impacted cerumen, right ear: Secondary | ICD-10-CM

## 2015-08-30 DIAGNOSIS — M1611 Unilateral primary osteoarthritis, right hip: Secondary | ICD-10-CM

## 2015-08-30 MED ORDER — NAPROXEN 500 MG PO TABS: 500.0000 mg | ORAL_TABLET | Freq: Two times a day (BID) | ORAL | 0 refills | Status: DC

## 2015-08-30 MED ORDER — CARBAMIDE PEROXIDE 6.5 % OT SOLN: 5.0000 [drp] | Freq: Two times a day (BID) | OTIC | 0 refills | Status: DC

## 2015-08-30 MED ORDER — CYCLOBENZAPRINE HCL 10 MG PO TABS: 10.0000 mg | ORAL_TABLET | Freq: Two times a day (BID) | ORAL | 0 refills | Status: DC | PRN

## 2015-08-30 MED FILL — NAPROXEN 500 MG TABLET: 500 | 15 days supply | Qty: 30 | Fill #0

## 2015-08-30 MED FILL — ?CYCLOBENZAPRINE 10 MG TABL: 10 | 10 days supply | Qty: 20 | Fill #0

## 2015-08-30 MED FILL — EAR DROPS 6.5%: 6.5 | 15 days supply | Qty: 15 | Fill #0

## 2015-08-30 NOTE — Patient Instructions (Addendum)
Financial aid /orange card   Osteoarthritis Osteoarthritis is a disease that causes soreness and inflammation of a joint. It occurs when the cartilage at the affected joint wears down. Cartilage acts as a cushion, covering the ends of bones where they meet to form a joint. Osteoarthritis is the most common form of arthritis. It often occurs in older people. The joints affected most often by this condition include those in the:  Ends of the fingers.  Thumbs.  Neck.  Lower back.  Knees.  Hips. CAUSES  Over time, the cartilage that covers the ends of bones begins to wear away. This causes bone to rub on bone, producing pain and stiffness in the affected joints.  RISK FACTORS Certain factors can increase your chances of having osteoarthritis, including:  Older age.  Excessive body weight.  Overuse of joints.  Previous joint injury. SIGNS AND SYMPTOMS   Pain, swelling, and stiffness in the joint.  Over time, the joint may lose its normal shape.  Small deposits of bone (osteophytes) may grow on the edges of the joint.  Bits of bone or cartilage can break off and float inside the joint space. This may cause more pain and damage. DIAGNOSIS  Your health care provider will do a physical exam and ask about your symptoms. Various tests may be ordered, such as:  X-rays of the affected joint.  Blood tests to rule out other types of arthritis. Additional tests may be used to diagnose your condition. TREATMENT  Goals of treatment are to control pain and improve joint function. Treatment plans may include:  A prescribed exercise program that allows for rest and joint relief.  A weight control plan.  Pain relief techniques, such as:  Properly applied heat and cold.  Electric pulses delivered to nerve endings under the skin (transcutaneous electrical nerve stimulation [TENS]).  Massage.  Certain nutritional supplements.  Medicines to control pain, such  as:  Acetaminophen.  Nonsteroidal anti-inflammatory drugs (NSAIDs), such as naproxen.  Narcotic or central-acting agents, such as tramadol.  Corticosteroids. These can be given orally or as an injection.  Surgery to reposition the bones and relieve pain (osteotomy) or to remove loose pieces of bone and cartilage. Joint replacement may be needed in advanced states of osteoarthritis. HOME CARE INSTRUCTIONS   Take medicines only as directed by your health care provider.  Maintain a healthy weight. Follow your health care provider's instructions for weight control. This may include dietary instructions.  Exercise as directed. Your health care provider can recommend specific types of exercise. These may include:  Strengthening exercises. These are done to strengthen the muscles that support joints affected by arthritis. They can be performed with weights or with exercise bands to add resistance.  Aerobic activities. These are exercises, such as brisk walking or low-impact aerobics, that get your heart pumping.  Range-of-motion activities. These keep your joints limber.  Balance and agility exercises. These help you maintain daily living skills.  Rest your affected joints as directed by your health care provider.  Keep all follow-up visits as directed by your health care provider. SEEK MEDICAL CARE IF:   Your skin turns red.  You develop a rash in addition to your joint pain.  You have worsening joint pain.  You have a fever along with joint or muscle aches. SEEK IMMEDIATE MEDICAL CARE IF:  You have a significant loss of weight or appetite.  You have night sweats. FOR MORE INFORMATION   National Institute of Arthritis and Musculoskeletal and Skin  Diseases: www.niams.http://www.myers.net/nih.gov  [employer] on Aging: https://walker.com/www.nia.nih.gov  American College of Rheumatology: www.rheumatology.org   This information is not intended to replace advice given to you by your health care provider.  Make sure you discuss any questions you have with your health care provider.   Document Released: 01/01/2005 Document Revised: 01/22/2014 Document Reviewed: 09/08/2012 Elsevier Interactive Patient Education [employer]2016 Elsevier Inc.

## 2015-08-30 NOTE — Progress Notes (Signed)
Pt is in the office today for establish Pt states his pain level today in the office today 8 Pt states his pain is coming from his right hip and back Pt states he has been taking tramadol and it has not been helping
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
## 2015-09-07 ENCOUNTER — Ambulatory Visit: Payer: Self-pay | Admitting: Student

## 2015-09-14 ENCOUNTER — Ambulatory Visit (INDEPENDENT_AMBULATORY_CARE_PROVIDER_SITE_OTHER): Payer: Medicaid Other | Admitting: Student

## 2015-09-14 ENCOUNTER — Encounter: Payer: Self-pay | Admitting: Student

## 2015-09-14 VITALS — BP 138/85 | HR 84 | Ht 73.0 in | Wt 280.0 lb

## 2015-09-14 DIAGNOSIS — M25551 Pain in right hip: Secondary | ICD-10-CM

## 2015-09-14 DIAGNOSIS — M1651 Unilateral post-traumatic osteoarthritis, right hip: Secondary | ICD-10-CM
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
## 2015-09-14 NOTE — Assessment & Plan Note (Signed)
Has severe osteoarthritis of right hip. Discussed that injections will likely not help stop the pain. Would recommend hip replacement. Referral placed a [facility] for hip replacement as it is covered by his insurance.

## 2015-10-19 ENCOUNTER — Ambulatory Visit (INDEPENDENT_AMBULATORY_CARE_PROVIDER_SITE_OTHER): Payer: Self-pay | Admitting: Physician Assistant

## 2015-10-31 ENCOUNTER — Ambulatory Visit (INDEPENDENT_AMBULATORY_CARE_PROVIDER_SITE_OTHER): Payer: Self-pay | Admitting: Physician Assistant

## 2015-10-31 DIAGNOSIS — M25551 Pain in right hip: Secondary | ICD-10-CM

## 2015-10-31 DIAGNOSIS — M1611 Unilateral primary osteoarthritis, right hip: Secondary | ICD-10-CM

## 2015-11-11 ENCOUNTER — Emergency Department (HOSPITAL_COMMUNITY)
Admission: EM | Admit: 2015-11-11 | Discharge: 2015-11-11 | Disposition: A | Discharge status: Home / Self Care | Payer: Self-pay | Attending: Emergency Medicine | Admitting: Emergency Medicine

## 2015-11-11 ENCOUNTER — Encounter (HOSPITAL_COMMUNITY): Payer: Self-pay

## 2015-11-11 DIAGNOSIS — G8929 Other chronic pain: Secondary | ICD-10-CM | POA: Insufficient documentation

## 2015-11-11 DIAGNOSIS — Z87891 Personal history of nicotine dependence: Secondary | ICD-10-CM | POA: Insufficient documentation

## 2015-11-11 DIAGNOSIS — M25551 Pain in right hip: Secondary | ICD-10-CM | POA: Insufficient documentation

## 2015-11-11 MED ORDER — DEXAMETHASONE SODIUM PHOSPHATE 10 MG/ML IJ SOLN
10.0000 mg | Freq: Once | INTRAMUSCULAR | Status: AC
  Administered 2015-11-11: 10 mg via INTRAMUSCULAR
  Filled 2015-11-11: qty 1

## 2015-11-11 MED ORDER — TRAMADOL HCL 50 MG PO TABS: 50.0000 mg | ORAL_TABLET | Freq: Four times a day (QID) | ORAL | 0 refills | Status: DC | PRN

## 2015-11-11 NOTE — ED Provider Notes (Signed)
[facility]EMERGENCY DEPT Provider Note   By signing my name below, I, [name], attest that this documentation has been prepared under the direction and in the presence of Shakaria Raphael, PA-C. Electronically Signed: [name], ED Scribe. 11/11/15. 2:10 PM.    History   Chief Complaint Chief Complaint  Patient presents with  . Hip Pain    The history is provided by the patient and medical records. No language interpreter was used.    HPI Comments:  Brandon Finley is an obese 55 y.o. male with PMHx of osteoarthritis of the right hip who presents to the Emergency Department complaining of an arthritis flare up of the right hip that began this morning upon waking. He states the pain is similar to his previous arthritis flare ups. He reports taking Meloxicam and Naproxen for his pain usually but states it is not helping today. Bearing weight and movement increase the pain. He denies alleviating factors. He denies numbness, tingling or weakness of the RLE, bruising, wounds, trauma, injury or fall, redness or warmth, fever or chills. He is supposed to be having a right hip replacement soon and his orthopedist is Dr. Magnus IvanBlackman.   Past Medical History:  Diagnosis Date  . Arthritis     Patient Active Problem List   Diagnosis Date Noted  . Post-traumatic osteoarthritis of right hip 09/14/2015  . Right hip pain 09/14/2015  . Prediabetes 07/25/2012  . Elevated blood pressure 07/25/2012  . Morbid obesity (HCC) 07/25/2012  . Metabolic syndrome 07/25/2012    History reviewed. No pertinent surgical history.    Home Medications    Prior to Admission medications   Medication Sig Start Date End Date Taking? Authorizing Provider  acetaminophen (TYLENOL) 325 MG tablet Take 2 tablets (650 mg total) by mouth every 6 (six) hours as needed. 08/05/15   Ace GinsSerena Y Sam, PA-C  carbamide peroxide (DEBROX) 6.5 % otic solution Place 5 drops into the right ear 2 (two) times daily. 08/30/15    Pete Glatterawn T Langeland, MD  cyclobenzaprine (FLEXERIL) 10 MG tablet Take 1 tablet (10 mg total) by mouth 2 (two) times daily as needed for muscle spasms. 08/30/15   Pete Glatterawn T Langeland, MD  hydrochlorothiazide (MICROZIDE) 12.5 MG capsule Take 1 capsule (12.5 mg total) by mouth daily. 07/25/12   Clanford Cyndie MullL Johnson, MD  HYDROcodone-acetaminophen (NORCO/VICODIN) 5-325 MG tablet Take 1-2 tablets by mouth every 6 (six) hours as needed for severe pain. 08/15/15   Danelle BerryLeisa Tapia, PA-C  meloxicam (MOBIC) 15 MG tablet Take 1 tablet (15 mg total) by mouth daily. Take 1 daily with food. 08/19/15   Arthor CaptainAbigail Harris, PA-C  naproxen (NAPROSYN) 500 MG tablet Take 1 tablet (500 mg total) by mouth 2 (two) times daily with a meal. 08/30/15   Pete Glatterawn T Langeland, MD  oxyCODONE-acetaminophen (PERCOCET) 5-325 MG tablet Take 1-2 tablets by mouth every 4 (four) hours as needed. 08/19/15   Arthor CaptainAbigail Harris, PA-C    Family History Family History  Problem Relation Age of Onset  . Hypertension Father     Social History Social History  Substance Use Topics  . Smoking status: Former Smoker    Quit date: 01/16/1995  . Smokeless tobacco: Never Used  . Alcohol use No     Allergies   Review of patient's allergies indicates no known allergies.   Review of Systems Review of Systems A complete 10 system review of systems was obtained and all systems are negative except as noted in the HPI and PMH.  Physical Exam Updated Vital Signs BP 143/92 (BP Location: Left Arm)   Pulse 84   Temp 98.4 F (36.9 C) (Oral)   Resp 18   Ht 6' (1.829 m)   Wt 288 lb (130.6 kg)   SpO2 97%   BMI 39.06 kg/m   Physical Exam  Constitutional: He is oriented to person, place, and time. He appears well-developed and well-nourished.  HENT:  Head: Normocephalic and atraumatic.  Neck: Normal range of motion.  Cardiovascular: Normal rate, regular rhythm and normal heart sounds.  Exam reveals no gallop and no friction rub.   No murmur heard. Right DP pulses  2+  Pulmonary/Chest: Effort normal and breath sounds normal. No respiratory distress. He has no wheezes. He has no rales.  Musculoskeletal: Normal range of motion. He exhibits tenderness. He exhibits no edema or deformity.  No erythema, edema or warmth of right hip. Tenderness to palpation of lateral aspect of right hip. Full ROM of right hip.  Neurological: He is alert and oriented to person, place, and time.  Distal sensations of right foot intact.  Skin: Skin is warm and dry.  Psychiatric: He has a normal mood and affect. His behavior is normal.  Nursing note and vitals reviewed.    ED Treatments / Results  DIAGNOSTIC STUDIES: Oxygen Saturation is 97% on RA, normal by my interpretation.   COORDINATION OF CARE: 2:06 PM- Will order steroid injection. Pt verbalizes understanding and agrees to plan.  Medications - No data to display  Labs (all labs ordered are listed, but only abnormal results are displayed) Labs Reviewed - No data to display  EKG  EKG Interpretation None       Radiology No results found.  Procedures Procedures (including critical care time)  Medications Ordered in ED Medications - No data to display   Initial Impression / Assessment and Plan / ED Course  I have reviewed the triage vital signs and the nursing notes.  Pertinent labs & imaging results that were available during my care of the patient were reviewed by me and considered in my medical decision making (see chart for details).  Clinical Course    Patient presents today with chronic right hip pain.  No acute injury or trauma.  No signs of infection.  Pt advised to follow up with orthopedics. Patient will be discharged home & is agreeable with above plan. Returns precautions discussed. Pt appears safe for discharge.  I personally performed the services described in this documentation, which was scribed in my presence. The recorded information has been reviewed and is accurate.   Final  Clinical Impressions(s) / ED Diagnoses   Final diagnoses:  None    New Prescriptions New Prescriptions   No medications on file     Santiago Glad, PA-C 11/11/15 1656    Donnetta Hutching, MD 11/12/15 1756

## 2015-11-11 NOTE — ED Notes (Signed)
Pt stable, understands discharge instructions, and reasons for return.   

## 2015-11-11 NOTE — ED Triage Notes (Signed)
Per Pt, Woke up this morning with right sided hip pain. Hx of Arthritis in the right hip.

## 2015-11-29 ENCOUNTER — Ambulatory Visit: Payer: Self-pay

## 2015-12-12 ENCOUNTER — Ambulatory Visit: Payer: Self-pay | Attending: Internal Medicine

## 2015-12-26 ENCOUNTER — Telehealth (INDEPENDENT_AMBULATORY_CARE_PROVIDER_SITE_OTHER): Payer: Self-pay | Admitting: Orthopaedic Surgery

## 2015-12-26 NOTE — Telephone Encounter (Signed)
Pt wanting to schedule operation in rt hip. He stated Dr. Magnus IvanBlackman wanted him to call us back first before scheduling. Pt number is [phone]

## 2015-12-27 NOTE — Telephone Encounter (Signed)
See below

## 2016-01-20 NOTE — Telephone Encounter (Signed)
Spoke with patient and scheduled surgery. °

## 2016-03-01 ENCOUNTER — Other Ambulatory Visit (INDEPENDENT_AMBULATORY_CARE_PROVIDER_SITE_OTHER): Payer: Self-pay | Admitting: Physician Assistant

## 2016-03-06 NOTE — Pre-Procedure Instructions (Signed)
Loni DollyGerald M [place]moreland  03/06/2016      [facility] [place], [place] - [address] [place] [address] Phone: [phone] Fax: [phone]  [facility] [place] [place] [address] Phone: [phone] Fax: [phone]    Your procedure is scheduled on February 27  [facility] at 1100 A.M.  Call this number if you have problems the morning of surgery:  [phone]   Remember:  Do not eat food or drink liquids after midnight.   Take these medicines the morning of surgery with A SIP OF WATER acetaminophen (TYLENOL), cyclobenzaprine (FLEXERIL), HYDROcodone-acetaminophen (NORCO/VICODIN),   7 days prior to surgery STOP taking any meloxicam (MOBIC,  Aspirin, Aleve, Naproxen, Ibuprofen, Motrin, Advil, Goody's, BC's, all herbal medications, fish oil, and all vitamins   Do not wear jewelry.  Do not wear lotions, powders, or cologne, or deoderant.  Men may shave face and neck.  Do not bring valuables to the hospital.  [facility] is not responsible for any belongings or valuables.  Contacts, dentures or bridgework may not be worn into surgery.  Leave your suitcase in the car.  After surgery it may be brought to your room.  For patients admitted to the hospital, discharge time will be determined by your treatment team.  Patients discharged the day of surgery will not be allowed to drive home.    Special instructions:   [facility]- Preparing For Surgery  Before surgery, you can play an important role. Because skin is not sterile, your skin needs to be as free of germs as possible. You can reduce the number of germs on your skin by washing with CHG (chlorahexidine gluconate) Soap before surgery.  CHG is an antiseptic cleaner which kills germs and bonds with the skin to continue killing germs even after washing.  Please do not use if you have  an allergy to CHG or antibacterial soaps. If your skin becomes reddened/irritated stop using the CHG.  Do not shave (including legs and underarms) for at least 48 hours prior to first CHG shower. It is OK to shave your face.  Please follow these instructions carefully.   1. Shower the NIGHT BEFORE SURGERY and the MORNING OF SURGERY with CHG.   2. If you chose to wash your hair, wash your hair first as usual with your normal shampoo.  3. After you shampoo, rinse your hair and body thoroughly to remove the shampoo.  4. Use CHG as you would any other liquid soap. You can apply CHG directly to the skin and wash gently with a scrungie or a clean washcloth.   5. Apply the CHG Soap to your body ONLY FROM THE NECK DOWN.  Do not use on open wounds or open sores. Avoid contact with your eyes, ears, mouth and genitals (private parts). Wash genitals (private parts) with your normal soap.  6. Wash thoroughly, paying special attention to the area where your surgery will be performed.  7. Thoroughly rinse your body with warm water from the neck down.  8. DO NOT shower/wash with your normal soap after using and rinsing off the CHG Soap.  9. Pat yourself dry with a CLEAN TOWEL.   10. Wear CLEAN PAJAMAS   11. Place CLEAN SHEETS on your bed the night of your first shower and DO NOT SLEEP WITH PETS.    Day of Surgery: Do  not apply any deodorants/lotions. Please wear clean clothes to the hospital/surgery center.      Please read over the following fact sheets that you were given.

## 2016-03-07 ENCOUNTER — Encounter (HOSPITAL_COMMUNITY): Payer: Self-pay

## 2016-03-07 ENCOUNTER — Other Ambulatory Visit: Payer: Self-pay

## 2016-03-07 ENCOUNTER — Encounter (HOSPITAL_COMMUNITY)
Admission: RE | Admit: 2016-03-07 | Discharge: 2016-03-07 | Disposition: A | Discharge status: Home / Self Care | Payer: Medicaid Other | Source: Ambulatory Visit | Attending: Orthopaedic Surgery | Admitting: Orthopaedic Surgery

## 2016-03-07 DIAGNOSIS — M1611 Unilateral primary osteoarthritis, right hip: Secondary | ICD-10-CM | POA: Insufficient documentation

## 2016-03-07 DIAGNOSIS — Z01810 Encounter for preprocedural cardiovascular examination: Secondary | ICD-10-CM | POA: Insufficient documentation

## 2016-03-07 DIAGNOSIS — Z01812 Encounter for preprocedural laboratory examination: Secondary | ICD-10-CM | POA: Diagnosis not present

## 2016-03-07 HISTORY — DX: Essential (primary) hypertension: I10

## 2016-03-07 LAB — COMPREHENSIVE METABOLIC PANEL
ALK PHOS: 61 U/L (ref 38–126)
ALT: 27 U/L (ref 17–63)
AST: 27 U/L (ref 15–41)
Albumin: 4.2 g/dL (ref 3.5–5.0)
Anion gap: 8 (ref 5–15)
BUN: 13 mg/dL (ref 6–20)
CALCIUM: 9.1 mg/dL (ref 8.9–10.3)
CHLORIDE: 109 mmol/L (ref 101–111)
CO2: 23 mmol/L (ref 22–32)
Creatinine, Ser: 0.72 mg/dL (ref 0.61–1.24)
GFR calc non Af Amer: >60 mL/min (ref >60)
Glucose, Bld: 94 mg/dL (ref 65–99)
POTASSIUM: 3.9 mmol/L (ref 3.5–5.1)
SODIUM: 140 mmol/L (ref 135–145)
Total Bilirubin: 0.7 mg/dL (ref 0.3–1.2)
Total Protein: 7.5 g/dL (ref 6.5–8.1)

## 2016-03-07 LAB — CBC
HCT: 41.6 % (ref 39.0–52.0)
Hemoglobin: 14.3 g/dL (ref 13.0–17.0)
MCH: 32.6 pg (ref 26.0–34.0)
MCHC: 34.4 g/dL (ref 30.0–36.0)
MCV: 94.8 fL (ref 78.0–100.0)
PLATELETS: 179 10*3/uL (ref 150–400)
RBC: 4.39 MIL/uL (ref 4.22–5.81)
RDW: 11.6 % (ref 11.5–15.5)
WBC: 6.2 10*3/uL (ref 4.0–10.5)

## 2016-03-07 LAB — SURGICAL PCR SCREEN
MRSA, PCR: NEGATIVE
STAPHYLOCOCCUS AUREUS: NEGATIVE

## 2016-03-07 NOTE — Progress Notes (Signed)
PCP - Dierdre Searlesawn Langeland Cardiologist - denies  Chest x-ray - not neede EKG - 03/07/16 Stress Test - denies ECHO - denies Cardiac Cath - denies  Sleep Study - denies    Patient denies shortness of breath, fever, cough and chest pain at PAT appointment   Patient verbalized understanding of instructions that was given to them at the PAT appointment. Patient expressed that there were no further questions.  Patient was also instructed that they will need to review over the PAT instructions again at home before the surgery.

## 2016-03-07 NOTE — Progress Notes (Signed)
   03/07/16 1310  OBSTRUCTIVE SLEEP APNEA  Have you ever been diagnosed with sleep apnea through a sleep study? No  Do you snore loudly (loud enough to be heard through closed doors)?  0  Do you often feel tired, fatigued, or sleepy during the daytime (such as falling asleep during driving or talking to someone)? 0  Has anyone observed you stop breathing during your sleep? 0  Do you have, or are you being treated for high blood pressure? 1  BMI more than 35 kg/m2? 1  Age > 50 (1-yes) 1  Neck circumference greater than:Male 16 inches or larger, Male 17inches or larger? 1  Male Gender (Yes=1) 1  Obstructive Sleep Apnea Score 5  Score 5 or greater  Results sent to PCP

## 2016-03-12 MED ORDER — TRANEXAMIC ACID 1000 MG/10ML IV SOLN
1000.0000 mg | INTRAVENOUS | Status: AC
  Administered 2016-03-13: 1000 mg via INTRAVENOUS
  Filled 2016-03-12: qty 10

## 2016-03-12 MED ORDER — DEXTROSE 5 % IV SOLN
3.0000 g | INTRAVENOUS | Status: AC
  Administered 2016-03-13: 3 g via INTRAVENOUS
  Filled 2016-03-12: qty 3000

## 2016-03-13 ENCOUNTER — Inpatient Hospital Stay (HOSPITAL_COMMUNITY): Payer: Medicaid Other | Admitting: Certified Registered Nurse Anesthetist

## 2016-03-13 ENCOUNTER — Encounter (HOSPITAL_COMMUNITY): Payer: Self-pay | Admitting: Certified Registered Nurse Anesthetist

## 2016-03-13 ENCOUNTER — Encounter (HOSPITAL_COMMUNITY)
Admission: RE | Disposition: A | Discharge status: Home Health | Payer: Self-pay | Source: Ambulatory Visit | Attending: Orthopaedic Surgery

## 2016-03-13 ENCOUNTER — Inpatient Hospital Stay (HOSPITAL_COMMUNITY)
Admission: RE | Admit: 2016-03-13 | Discharge: 2016-03-16 | DRG: 470 | Disposition: A | Discharge status: Home Health | Payer: Medicaid Other | Source: Ambulatory Visit | Attending: Orthopaedic Surgery | Admitting: Orthopaedic Surgery

## 2016-03-13 ENCOUNTER — Inpatient Hospital Stay (HOSPITAL_COMMUNITY): Payer: Medicaid Other

## 2016-03-13 DIAGNOSIS — M1651 Unilateral post-traumatic osteoarthritis, right hip: Principal | ICD-10-CM | POA: Diagnosis present

## 2016-03-13 DIAGNOSIS — M1611 Unilateral primary osteoarthritis, right hip: Secondary | ICD-10-CM

## 2016-03-13 DIAGNOSIS — I10 Essential (primary) hypertension: Secondary | ICD-10-CM | POA: Diagnosis present

## 2016-03-13 DIAGNOSIS — Z6841 Body Mass Index (BMI) 40.0 and over, adult: Secondary | ICD-10-CM

## 2016-03-13 DIAGNOSIS — T1490XS Injury, unspecified, sequela: Secondary | ICD-10-CM | POA: Diagnosis not present

## 2016-03-13 DIAGNOSIS — Z419 Encounter for procedure for purposes other than remedying health state, unspecified: Secondary | ICD-10-CM

## 2016-03-13 DIAGNOSIS — R7303 Prediabetes: Secondary | ICD-10-CM | POA: Diagnosis present

## 2016-03-13 DIAGNOSIS — Z79899 Other long term (current) drug therapy: Secondary | ICD-10-CM

## 2016-03-13 DIAGNOSIS — M25551 Pain in right hip: Secondary | ICD-10-CM | POA: Diagnosis present

## 2016-03-13 DIAGNOSIS — Z96641 Presence of right artificial hip joint: Secondary | ICD-10-CM

## 2016-03-13 HISTORY — PX: TOTAL HIP ARTHROPLASTY: SHX124

## 2016-03-13 SURGERY — ARTHROPLASTY, HIP, TOTAL, ANTERIOR APPROACH
Anesthesia: General | Site: Hip | Laterality: Right

## 2016-03-13 MED ORDER — SUGAMMADEX SODIUM 200 MG/2ML IV SOLN
INTRAVENOUS | Status: DC | PRN
  Administered 2016-03-13: 200 mg via INTRAVENOUS

## 2016-03-13 MED ORDER — ONDANSETRON HCL 4 MG/2ML IJ SOLN
INTRAMUSCULAR | Status: AC
  Filled 2016-03-13: qty 2

## 2016-03-13 MED ORDER — ZOLPIDEM TARTRATE 5 MG PO TABS: 5.0000 mg | ORAL_TABLET | Freq: Every evening | ORAL | Status: DC | PRN

## 2016-03-13 MED ORDER — HYDROMORPHONE HCL 2 MG/ML IJ SOLN
1.0000 mg | INTRAMUSCULAR | Status: DC | PRN
Start: 2016-03-13 — End: 2016-03-16
  Administered 2016-03-13 – 2016-03-15 (×10): 1 mg via INTRAVENOUS
  Filled 2016-03-13 (×11): qty 1

## 2016-03-13 MED ORDER — LIDOCAINE 2% (20 MG/ML) 5 ML SYRINGE
INTRAMUSCULAR | Status: DC | PRN
Start: 2016-03-13 — End: 2016-03-13
  Administered 2016-03-13: 100 mg via INTRAVENOUS

## 2016-03-13 MED ORDER — PROMETHAZINE HCL 25 MG/ML IJ SOLN: 6.2500 mg | INTRAMUSCULAR | Status: DC | PRN

## 2016-03-13 MED ORDER — LACTATED RINGERS IV SOLN
Freq: Once | INTRAVENOUS | Status: AC
  Administered 2016-03-13: 11:00:00 via INTRAVENOUS

## 2016-03-13 MED ORDER — DEXAMETHASONE SODIUM PHOSPHATE 10 MG/ML IJ SOLN
INTRAMUSCULAR | Status: AC
  Filled 2016-03-13: qty 1

## 2016-03-13 MED ORDER — HYDROMORPHONE HCL 1 MG/ML IJ SOLN
INTRAMUSCULAR | Status: AC
  Filled 2016-03-13: qty 1

## 2016-03-13 MED ORDER — ACETAMINOPHEN 325 MG PO TABS
650.0000 mg | ORAL_TABLET | Freq: Four times a day (QID) | ORAL | Status: DC | PRN
  Administered 2016-03-14 – 2016-03-15 (×3): 650 mg via ORAL
  Filled 2016-03-13 (×3): qty 2

## 2016-03-13 MED ORDER — HYDROMORPHONE HCL 1 MG/ML IJ SOLN: 0.2500 mg | INTRAMUSCULAR | Status: DC | PRN

## 2016-03-13 MED ORDER — MENTHOL 3 MG MT LOZG: 1.0000 | LOZENGE | OROMUCOSAL | Status: DC | PRN

## 2016-03-13 MED ORDER — HYDROMORPHONE HCL 1 MG/ML IJ SOLN
INTRAMUSCULAR | Status: AC
  Filled 2016-03-13: qty 0.5

## 2016-03-13 MED ORDER — OXYCODONE HCL 5 MG PO TABS
5.0000 mg | ORAL_TABLET | ORAL | Status: DC | PRN
  Administered 2016-03-13: 5 mg via ORAL
  Administered 2016-03-14 – 2016-03-16 (×7): 10 mg via ORAL
  Filled 2016-03-13 (×8): qty 2

## 2016-03-13 MED ORDER — ROCURONIUM BROMIDE 10 MG/ML (PF) SYRINGE
PREFILLED_SYRINGE | INTRAVENOUS | Status: DC | PRN
  Administered 2016-03-13: 20 mg via INTRAVENOUS
  Administered 2016-03-13: 30 mg via INTRAVENOUS
  Administered 2016-03-13: 50 mg via INTRAVENOUS

## 2016-03-13 MED ORDER — PHENOL 1.4 % MT LIQD: 1.0000 | OROMUCOSAL | Status: DC | PRN

## 2016-03-13 MED ORDER — HYDROCHLOROTHIAZIDE 12.5 MG PO CAPS
12.5000 mg | ORAL_CAPSULE | Freq: Every day | ORAL | Status: DC
Start: 2016-03-13 — End: 2016-03-16
  Administered 2016-03-14 – 2016-03-16 (×3): 12.5 mg via ORAL
  Filled 2016-03-13 (×3): qty 1

## 2016-03-13 MED ORDER — METOCLOPRAMIDE HCL 5 MG PO TABS: 5.0000 mg | ORAL_TABLET | Freq: Three times a day (TID) | ORAL | Status: DC | PRN

## 2016-03-13 MED ORDER — FENTANYL CITRATE (PF) 100 MCG/2ML IJ SOLN
INTRAMUSCULAR | Status: DC | PRN
  Administered 2016-03-13: 50 ug via INTRAVENOUS
  Administered 2016-03-13: 150 ug via INTRAVENOUS
  Administered 2016-03-13 (×2): 50 ug via INTRAVENOUS

## 2016-03-13 MED ORDER — SODIUM CHLORIDE 0.9 % IV SOLN
INTRAVENOUS | Status: DC
  Administered 2016-03-13 – 2016-03-14 (×2): via INTRAVENOUS

## 2016-03-13 MED ORDER — FENTANYL CITRATE (PF) 100 MCG/2ML IJ SOLN
INTRAMUSCULAR | Status: AC
  Filled 2016-03-13: qty 4

## 2016-03-13 MED ORDER — 0.9 % SODIUM CHLORIDE (POUR BTL) OPTIME
TOPICAL | Status: DC | PRN
  Administered 2016-03-13: 1000 mL

## 2016-03-13 MED ORDER — METHOCARBAMOL 500 MG PO TABS
ORAL_TABLET | ORAL | Status: AC
  Filled 2016-03-13: qty 1

## 2016-03-13 MED ORDER — MIDAZOLAM HCL 2 MG/2ML IJ SOLN
INTRAMUSCULAR | Status: AC
  Filled 2016-03-13: qty 2

## 2016-03-13 MED ORDER — CEFAZOLIN SODIUM-DEXTROSE 2-4 GM/100ML-% IV SOLN
2.0000 g | Freq: Four times a day (QID) | INTRAVENOUS | Status: AC
  Administered 2016-03-14: 2 g via INTRAVENOUS
  Filled 2016-03-13 (×2): qty 100

## 2016-03-13 MED ORDER — LIDOCAINE 2% (20 MG/ML) 5 ML SYRINGE
INTRAMUSCULAR | Status: AC
  Filled 2016-03-13: qty 5

## 2016-03-13 MED ORDER — ONDANSETRON HCL 4 MG PO TABS: 4.0000 mg | ORAL_TABLET | Freq: Four times a day (QID) | ORAL | Status: DC | PRN

## 2016-03-13 MED ORDER — ALBUMIN HUMAN 5 % IV SOLN
INTRAVENOUS | Status: DC | PRN
  Administered 2016-03-13: 13:00:00 via INTRAVENOUS

## 2016-03-13 MED ORDER — METOCLOPRAMIDE HCL 5 MG/ML IJ SOLN: 5.0000 mg | Freq: Three times a day (TID) | INTRAMUSCULAR | Status: DC | PRN

## 2016-03-13 MED ORDER — SODIUM CHLORIDE 0.9 % IR SOLN
Status: DC | PRN
  Administered 2016-03-13: 3000 mL

## 2016-03-13 MED ORDER — MIDAZOLAM HCL 5 MG/5ML IJ SOLN
INTRAMUSCULAR | Status: DC | PRN
  Administered 2016-03-13: 2 mg via INTRAVENOUS

## 2016-03-13 MED ORDER — HYDROMORPHONE HCL 1 MG/ML IJ SOLN
0.2500 mg | INTRAMUSCULAR | Status: DC | PRN
  Administered 2016-03-13 (×4): 0.5 mg via INTRAVENOUS

## 2016-03-13 MED ORDER — ACETAMINOPHEN 650 MG RE SUPP: 650.0000 mg | Freq: Four times a day (QID) | RECTAL | Status: DC | PRN

## 2016-03-13 MED ORDER — ONDANSETRON HCL 4 MG/2ML IJ SOLN: 4.0000 mg | Freq: Four times a day (QID) | INTRAMUSCULAR | Status: DC | PRN

## 2016-03-13 MED ORDER — LACTATED RINGERS IV SOLN
INTRAVENOUS | Status: DC | PRN
  Administered 2016-03-13 (×2): via INTRAVENOUS

## 2016-03-13 MED ORDER — PHENYLEPHRINE HCL 10 MG/ML IJ SOLN
INTRAVENOUS | Status: DC | PRN
  Administered 2016-03-13: 40 ug/min via INTRAVENOUS

## 2016-03-13 MED ORDER — DEXAMETHASONE SODIUM PHOSPHATE 10 MG/ML IJ SOLN
INTRAMUSCULAR | Status: DC | PRN
  Administered 2016-03-13: 10 mg via INTRAVENOUS

## 2016-03-13 MED ORDER — DOCUSATE SODIUM 100 MG PO CAPS
100.0000 mg | ORAL_CAPSULE | Freq: Two times a day (BID) | ORAL | Status: DC
  Administered 2016-03-13 – 2016-03-16 (×6): 100 mg via ORAL
  Filled 2016-03-13 (×6): qty 1

## 2016-03-13 MED ORDER — METHOCARBAMOL 1000 MG/10ML IJ SOLN
500.0000 mg | Freq: Four times a day (QID) | INTRAVENOUS | Status: DC | PRN
  Filled 2016-03-13: qty 5

## 2016-03-13 MED ORDER — ROCURONIUM BROMIDE 50 MG/5ML IV SOSY
PREFILLED_SYRINGE | INTRAVENOUS | Status: AC
  Filled 2016-03-13: qty 5

## 2016-03-13 MED ORDER — METHOCARBAMOL 500 MG PO TABS
500.0000 mg | ORAL_TABLET | Freq: Four times a day (QID) | ORAL | Status: DC | PRN
  Administered 2016-03-13 – 2016-03-15 (×7): 500 mg via ORAL
  Filled 2016-03-13 (×7): qty 1

## 2016-03-13 MED ORDER — ASPIRIN 81 MG PO CHEW
81.0000 mg | CHEWABLE_TABLET | Freq: Two times a day (BID) | ORAL | Status: DC
  Administered 2016-03-13 – 2016-03-16 (×6): 81 mg via ORAL
  Filled 2016-03-13 (×6): qty 1

## 2016-03-13 MED ORDER — PROPOFOL 10 MG/ML IV BOLUS
INTRAVENOUS | Status: AC
  Filled 2016-03-13: qty 20

## 2016-03-13 MED ORDER — ONDANSETRON HCL 4 MG/2ML IJ SOLN
INTRAMUSCULAR | Status: DC | PRN
  Administered 2016-03-13: 4 mg via INTRAVENOUS

## 2016-03-13 MED ORDER — CHLORHEXIDINE GLUCONATE 4 % EX LIQD: 60.0000 mL | Freq: Once | CUTANEOUS | Status: DC

## 2016-03-13 MED ORDER — OXYCODONE HCL 5 MG PO TABS
ORAL_TABLET | ORAL | Status: AC
  Filled 2016-03-13: qty 1

## 2016-03-13 MED ORDER — DIPHENHYDRAMINE HCL 12.5 MG/5ML PO ELIX: 12.5000 mg | ORAL_SOLUTION | ORAL | Status: DC | PRN

## 2016-03-13 MED ORDER — POLYETHYLENE GLYCOL 3350 17 G PO PACK
17.0000 g | PACK | Freq: Every day | ORAL | Status: DC | PRN
  Administered 2016-03-15: 17 g via ORAL
  Filled 2016-03-13: qty 1

## 2016-03-13 MED ORDER — NEOSTIGMINE METHYLSULFATE 5 MG/5ML IV SOSY
PREFILLED_SYRINGE | INTRAVENOUS | Status: AC
  Filled 2016-03-13: qty 5

## 2016-03-13 MED ORDER — PROPOFOL 10 MG/ML IV BOLUS
INTRAVENOUS | Status: DC | PRN
  Administered 2016-03-13: 200 mg via INTRAVENOUS

## 2016-03-13 MED ORDER — ALUM & MAG HYDROXIDE-SIMETH 200-200-20 MG/5ML PO SUSP: 30.0000 mL | ORAL | Status: DC | PRN

## 2016-03-13 SURGICAL SUPPLY — 54 items
BENZOIN TINCTURE PRP APPL 2/3 (GAUZE/BANDAGES/DRESSINGS) ×3 IMPLANT
BLADE CLIPPER SURG (BLADE) IMPLANT
BLADE SAW SGTL 18X1.27X75 (BLADE) ×2 IMPLANT
BLADE SAW SGTL 18X1.27X75MM (BLADE) ×1
CAPT HIP TOTAL 2 ×3 IMPLANT
CELLS DAT CNTRL 66122 CELL SVR (MISCELLANEOUS) ×1 IMPLANT
CLOSURE WOUND 1/2 X4 (GAUZE/BANDAGES/DRESSINGS) ×2
COVER SURGICAL LIGHT HANDLE (MISCELLANEOUS) ×3 IMPLANT
DRAPE C-ARM 42X72 X-RAY (DRAPES) ×3 IMPLANT
DRAPE STERI IOBAN 125X83 (DRAPES) ×3 IMPLANT
DRAPE U-SHAPE 47X51 STRL (DRAPES) ×9 IMPLANT
DRSG AQUACEL AG ADV 3.5X 6 (GAUZE/BANDAGES/DRESSINGS) ×3 IMPLANT
DRSG AQUACEL AG ADV 3.5X10 (GAUZE/BANDAGES/DRESSINGS) ×3 IMPLANT
DURAPREP 26ML APPLICATOR (WOUND CARE) ×3 IMPLANT
ELECT BLADE 4.0 EZ CLEAN MEGAD (MISCELLANEOUS) ×3
ELECT BLADE 6.5 EXT (BLADE) IMPLANT
ELECT REM PT RETURN 9FT ADLT (ELECTROSURGICAL) ×3
ELECTRODE BLDE 4.0 EZ CLN MEGD (MISCELLANEOUS) ×1 IMPLANT
ELECTRODE REM PT RTRN 9FT ADLT (ELECTROSURGICAL) ×1 IMPLANT
FACESHIELD WRAPAROUND (MASK) ×6 IMPLANT
GAUZE XEROFORM 1X8 LF (GAUZE/BANDAGES/DRESSINGS) ×3 IMPLANT
GLOVE BIOGEL PI IND STRL 8 (GLOVE) ×2 IMPLANT
GLOVE BIOGEL PI INDICATOR 8 (GLOVE) ×4
GLOVE ECLIPSE 8.0 STRL XLNG CF (GLOVE) ×3 IMPLANT
GLOVE ORTHO TXT STRL SZ7.5 (GLOVE) ×6 IMPLANT
GOWN STRL REUS W/ TWL LRG LVL3 (GOWN DISPOSABLE) ×2 IMPLANT
GOWN STRL REUS W/ TWL XL LVL3 (GOWN DISPOSABLE) ×2 IMPLANT
GOWN STRL REUS W/TWL LRG LVL3 (GOWN DISPOSABLE) ×6
GOWN STRL REUS W/TWL XL LVL3 (GOWN DISPOSABLE) ×6
HANDPIECE INTERPULSE COAX TIP (DISPOSABLE) ×3
KIT BASIN OR (CUSTOM PROCEDURE TRAY) ×3 IMPLANT
KIT ROOM TURNOVER OR (KITS) ×3 IMPLANT
MANIFOLD NEPTUNE II (INSTRUMENTS) ×3 IMPLANT
NS IRRIG 1000ML POUR BTL (IV SOLUTION) ×3 IMPLANT
PACK TOTAL JOINT (CUSTOM PROCEDURE TRAY) ×3 IMPLANT
PAD ARMBOARD 7.5X6 YLW CONV (MISCELLANEOUS) ×3 IMPLANT
RTRCTR WOUND ALEXIS 18CM MED (MISCELLANEOUS) ×3
SET HNDPC FAN SPRY TIP SCT (DISPOSABLE) ×1 IMPLANT
STAPLER VISISTAT 35W (STAPLE) ×3 IMPLANT
STRIP CLOSURE SKIN 1/2X4 (GAUZE/BANDAGES/DRESSINGS) ×4 IMPLANT
SUT ETHIBOND NAB CT1 #1 30IN (SUTURE) ×3 IMPLANT
SUT MNCRL AB 4-0 PS2 18 (SUTURE) IMPLANT
SUT VIC AB 0 CT1 27 (SUTURE) ×3
SUT VIC AB 0 CT1 27XBRD ANBCTR (SUTURE) ×1 IMPLANT
SUT VIC AB 1 CT1 27 (SUTURE) ×3
SUT VIC AB 1 CT1 27XBRD ANBCTR (SUTURE) ×1 IMPLANT
SUT VIC AB 2-0 CT1 27 (SUTURE) ×3
SUT VIC AB 2-0 CT1 TAPERPNT 27 (SUTURE) ×1 IMPLANT
TOWEL OR 17X24 6PK STRL BLUE (TOWEL DISPOSABLE) ×3 IMPLANT
TOWEL OR 17X26 10 PK STRL BLUE (TOWEL DISPOSABLE) ×3 IMPLANT
TRAY CATH 16FR W/PLASTIC CATH (SET/KITS/TRAYS/PACK) IMPLANT
TRAY FOLEY W/METER SILVER 16FR (SET/KITS/TRAYS/PACK) IMPLANT
WATER STERILE IRR 1000ML POUR (IV SOLUTION) ×6 IMPLANT
YANKAUER SUCT BULB TIP NO VENT (SUCTIONS) ×3 IMPLANT

## 2016-03-13 NOTE — Anesthesia Preprocedure Evaluation (Signed)
Anesthesia Evaluation  Patient identified by MRN, date of birth, ID band Patient awake    Reviewed: Allergy & Precautions, NPO status , Patient's Chart, lab work & pertinent test results  Airway Mallampati: II  TM Distance: >3 FB Neck ROM: Full    Dental  (+) Teeth Intact   Pulmonary former smoker,    breath sounds clear to auscultation       Cardiovascular hypertension,  Rhythm:Regular Rate:Normal     Neuro/Psych    GI/Hepatic negative GI ROS, Neg liver ROS,   Endo/Other  Morbid obesity  Renal/GU negative Renal ROS     Musculoskeletal  (+) Arthritis ,   Abdominal (+) + obese,   Peds  Hematology   Anesthesia Other Findings   Reproductive/Obstetrics                             Anesthesia Physical Anesthesia Plan  ASA: II  Anesthesia Plan: General   Post-op Pain Management:    Induction: Intravenous  Airway Management Planned: Oral ETT  Additional Equipment:   Intra-op Plan:   Post-operative Plan: Extubation in OR  Informed Consent: I have reviewed the patients History and Physical, chart, labs and discussed the procedure including the risks, benefits and alternatives for the proposed anesthesia with the patient or authorized representative who has indicated his/her understanding and acceptance.     Plan Discussed with:   Anesthesia Plan Comments:         Anesthesia Quick Evaluation

## 2016-03-13 NOTE — Brief Op Note (Signed)
03/13/2016  2:22 PM  PATIENT:  Brandon Finley  56 y.o. male  PRE-OPERATIVE DIAGNOSIS:  severe osteoarthritis right hip  POST-OPERATIVE DIAGNOSIS:  severe osteoarthritis right hip  PROCEDURE:  Procedure(s): RIGHT TOTAL HIP ARTHROPLASTY ANTERIOR APPROACH (Right)  SURGEON:  Surgeon(s) and Role:    * Kathryne Hitchhristopher Y Bonnetta Allbee, MD - Primary  PHYSICIAN ASSISTANT: Rexene EdisonGil Clark, PA-C  ANESTHESIA:   general  EBL:  Total I/O In: 1250 [I.V.:1000; IV Piggyback:250] Out: 650 [Blood:650]  COUNTS:  YES  PLAN OF CARE: Admit to inpatient   PATIENT DISPOSITION:  PACU - hemodynamically stable.   Delay start of Pharmacological VTE agent (>24hrs) due to surgical blood loss or risk of bleeding: no

## 2016-03-13 NOTE — Transfer of Care (Signed)
Immediate Anesthesia Transfer of Care Note  Patient: Brandon Finley  Procedure(s) Performed: Procedure(s): RIGHT TOTAL HIP ARTHROPLASTY ANTERIOR APPROACH (Right)  Patient Location: PACU  Anesthesia Type:General  Level of Consciousness: awake, alert , oriented and patient cooperative  Airway & Oxygen Therapy: Patient Spontanous Breathing and Patient connected to face mask oxygen  Post-op Assessment: Report given to RN, Post -op Vital signs reviewed and stable and Patient moving all extremities X 4  Post vital signs: Reviewed and stable  Last Vitals:  Vitals:   03/13/16 1045 03/13/16 1430  BP: (!) 141/74 (!) 160/95  Pulse:  85  Resp:  (!) 35  Temp:  36.2 C    Last Pain:  Vitals:   03/13/16 1045  TempSrc:   PainSc: 0-No pain      Patients Stated Pain Goal: 3 (03/13/16 1045)  Complications: No apparent anesthesia complications

## 2016-03-13 NOTE — H&P (Signed)
TOTAL HIP ADMISSION H&P  Patient is admitted for right total hip arthroplasty.  Subjective:  Chief Complaint: right hip pain  HPI: Brandon Finley, 56 y.o. male, has a history of pain and functional disability in the right hip(s) due to trauma and arthritis and patient has failed non-surgical conservative treatments for greater than 12 weeks to include NSAID's and/or analgesics, corticosteriod injections, viscosupplementation injections, use of assistive devices, weight reduction as appropriate and activity modification.  Onset of symptoms was gradual starting 5 years ago with gradually worsening course since that time.The patient noted no past surgery on the right hip(s).  Patient currently rates pain in the right hip at 10 out of 10 with activity. Patient has night pain, worsening of pain with activity and weight bearing, trendelenberg gait, pain that interfers with activities of daily living, pain with passive range of motion and crepitus. Patient has evidence of subchondral cysts, subchondral sclerosis, periarticular osteophytes and joint space narrowing by imaging studies. This condition presents safety issues increasing the risk of falls.  There is no current active infection.  Patient Active Problem List   Diagnosis Date Noted  . Post-traumatic osteoarthritis of right hip 09/14/2015  . Right hip pain 09/14/2015  . Prediabetes 07/25/2012  . Elevated blood pressure 07/25/2012  . Morbid obesity (HCC) 07/25/2012  . Metabolic syndrome 07/25/2012   Past Medical History:  Diagnosis Date  . Arthritis   . Hypertension     History reviewed. No pertinent surgical history.  Prescriptions Prior to Admission  Medication Sig Dispense Refill Last Dose  . acetaminophen (TYLENOL) 325 MG tablet Take 2 tablets (650 mg total) by mouth every 6 (six) hours as needed. (Patient taking differently: Take 650 mg by mouth every 6 (six) hours as needed for mild pain. ) 30 tablet 0 Past Month at Unknown time   . carbamide peroxide (DEBROX) 6.5 % otic solution Place 5 drops into the right ear 2 (two) times daily. (Patient taking differently: Place 5 drops into both ears 2 (two) times daily as needed (ear wax). ) 15 mL 0 Past Week at Unknown time  . cyclobenzaprine (FLEXERIL) 10 MG tablet Take 1 tablet (10 mg total) by mouth 2 (two) times daily as needed for muscle spasms. 30 tablet 0 Past Week at Unknown time  . hydrochlorothiazide (MICROZIDE) 12.5 MG capsule Take 1 capsule (12.5 mg total) by mouth daily. 30 capsule 3 Past Week at Unknown time  . HYDROcodone-acetaminophen (NORCO/VICODIN) 5-325 MG tablet Take 1-2 tablets by mouth every 6 (six) hours as needed for severe pain. (Patient taking differently: Take 1-2 tablets by mouth every 6 (six) hours as needed for severe pain (depends on pain level if takes 1-2 tablets). ) 10 tablet 0 Past Week at Unknown time  . meloxicam (MOBIC) 15 MG tablet Take 1 tablet (15 mg total) by mouth daily. Take 1 daily with food. 30 tablet 0 Past Week at Unknown time  . naproxen (NAPROSYN) 500 MG tablet Take 1 tablet (500 mg total) by mouth 2 (two) times daily with a meal. 30 tablet 0 Past Week at Unknown time  . traMADol (ULTRAM) 50 MG tablet Take 1 tablet (50 mg total) by mouth every 6 (six) hours as needed. (Patient taking differently: Take 50 mg by mouth every 6 (six) hours as needed for moderate pain. ) 15 tablet 0 Past Week at Unknown time  . oxyCODONE-acetaminophen (PERCOCET) 5-325 MG tablet Take 1-2 tablets by mouth every 4 (four) hours as needed. (Patient not taking: Reported on  03/05/2016) 10 tablet 0 Not Taking at Unknown time   Allergies  Allergen Reactions  . No Known Allergies     Social History  Substance Use Topics  . Smoking status: Former Smoker    Quit date: 01/16/1995  . Smokeless tobacco: Never Used  . Alcohol use No    Family History  Problem Relation Age of Onset  . Hypertension Father      Review of Systems  Musculoskeletal: Positive for back  pain and joint pain.  All other systems reviewed and are negative.   Objective:  Physical Exam  Constitutional: He is oriented to person, place, and time. He appears well-developed and well-nourished.  HENT:  Head: Normocephalic and atraumatic.  Eyes: EOM are normal. Pupils are equal, round, and reactive to light.  Neck: Normal range of motion. Neck supple.  Cardiovascular: Normal rate and regular rhythm.   Respiratory: Effort normal and breath sounds normal.  GI: Soft. Bowel sounds are normal.  Neurological: He is alert and oriented to person, place, and time.  Skin: Skin is warm and dry.  Psychiatric: He has a normal mood and affect.    Vital signs in last 24 hours: Temp:  [98.2 F (36.8 C)] 98.2 F (36.8 C) (02/27 1013) Pulse Rate:  [84] 84 (02/27 1013) Resp:  [18] 18 (02/27 1013) BP: (141-174)/(74-92) 141/74 (02/27 1045) SpO2:  [95 %] 95 % (02/27 1013) Weight:  [304 lb (137.9 kg)] 304 lb (137.9 kg) (02/27 1013)  Labs:   Estimated body mass index is 40.11 kg/m as calculated from the following:   Height as of 03/07/16: 6\' 1"  (1.854 m).   Weight as of this encounter: 304 lb (137.9 kg).   Imaging Review Plain radiographs demonstrate severe degenerative joint disease of the right hip(s). The bone quality appears to be good for age and reported activity level.  Assessment/Plan:  End stage arthritis, right hip(s)  The patient history, physical examination, clinical judgement of the provider and imaging studies are consistent with end stage degenerative joint disease of the right hip(s) and total hip arthroplasty is deemed medically necessary. The treatment options including medical management, injection therapy, arthroscopy and arthroplasty were discussed at length. The risks and benefits of total hip arthroplasty were presented and reviewed. The risks due to aseptic loosening, infection, stiffness, dislocation/subluxation,  thromboembolic complications and other  imponderables were discussed.  The patient acknowledged the explanation, agreed to proceed with the plan and consent was signed. Patient is being admitted for inpatient treatment for surgery, pain control, PT, OT, prophylactic antibiotics, VTE prophylaxis, progressive ambulation and ADL's and discharge planning.The patient is planning to be discharged to skilled nursing facility

## 2016-03-13 NOTE — Op Note (Signed)
NAMEARJAY, JASKIEWICZ NO.:  [id number]  MEDICAL RECORD NO.:  [id number]  LOCATION:  [facility]                         FACILITY:  [facility]  PHYSICIAN:  Vanita Panda. Magnus Ivan, M.D.DATE OF BIRTH:  1961/01/02  DATE OF PROCEDURE:  03/13/2016 DATE OF DISCHARGE:                              OPERATIVE REPORT   PREOPERATIVE DIAGNOSIS:  Severe end-stage osteoarthritis and degenerative joint disease, right hip.  POSTOPERATIVE DIAGNOSIS:  Severe end-stage osteoarthritis and degenerative joint disease, right hip.  PROCEDURE:  Right total hip arthroplasty through direct anterior approach.  IMPLANTS:  DePuy Sector Gription acetabular component size 62, size 36+ 4 polyethylene liner, size 12 Corail femoral component with varus offset, size 36+ 8.5 ceramic hip ball.  SURGEON:  Vanita Panda. Magnus Ivan, M.D.  ASSISTANT:  Richardean Canal, PA-C.  ANESTHESIA:  General.  ANTIBIOTICS:  2 g of IV Ancef.  BLOOD LOSS:  650 mL.  COMPLICATIONS:  None.  INDICATIONS:  Mr. Swindler is a very pleasant 56 year old morbidly obese gentleman with severe osteoarthritis and degenerative joint disease of his right hip.  This confirmed with x-ray showing complete loss of the superolateral joint space and severe sclerotic and cystic changes.  He has tried and failed all forms of conservative treatment.  His pain is daily and it detrimentally affected his activities of daily living, his quality of life, and his mobility.  At this point, he does wish to proceed with a total hip replacement.  He understands the difficulty of this surgery given his size and the main risk of acute blood loss anemia, nerve and vessel injury, fracture, infection, dislocation, DVT and all these are high given his obesity.  He is not a diabetic and nonsmoker.  He understands our goals of surgery are decreased pain, improved mobility, and overall improved quality of life.  PROCEDURE DESCRIPTION:  After informed consent  was obtained, appropriate right hip was marked.  He was brought to the operating room where general anesthesia was obtained while he was on a stretcher.  Traction boots were placed on both his feet.  Next, he was placed supine on the operating table with the perineal post in place and both legs in inline skeletal traction devices, but no traction applied.  His right operative hip was then prepped and draped with DuraPrep and sterile drapes.  A time-out was called to identify correct patient and correct right hip. We then made an incision just inferior and posterior to the anterior superior iliac spine and carried this obliquely down the leg.  We dissected down tensor fascia lata muscle.  The tensor fascia was then divided longitudinally to proceed with a direct anterior approach to the hip.  We identified and cauterized the circumflex vessels and identified the hip capsule.  We opened up the hip capsule in L-type format finding a significant joint effusion and severe periarticular osteophytes all around the hip.  We placed Cobra retractors around the medial and lateral femoral neck and then made our femoral neck cut with an oscillating saw proximal to the lesser trochanter and completed this on osteotome.  We placed a corkscrew guide in the femoral head and removed the femoral head in its entirety and found it to be  devoid of cartilage. We then placed a bent Hohmann over the medial acetabular rim and cleaned remnants acetabular labrum and then began reaming from a size 43 reamer all the way up to a size 62 with all reamers under direct visualization and the last 2 reamers under direct fluoroscopy, so we could obtain our depth of reaming, our inclination, and anteversion.  Once we were pleased with this, we placed the real DePuy Sector Gription acetabular component size 62 and a 36+ 4 neutral polyethylene liner for that size acetabular component.  Attention was then turned to the femur.   With the leg externally rotated to 120 degrees, extended and adducted, we were able to place a Mueller retractor medially and a Hohmann retractor behind the greater trochanter.  We released the joint capsule and used a box cutting osteotome to enter the femoral canal and a rongeur to lateralize.  I then began broaching using the Corail broaching system from a size 8 broach all the way up to a size 12.  With a size 12 in place, we trialed a varus offset femoral neck and 36+ 1.5 hip ball was too short, so we went up to an 8+ 5.  We brought the leg back over and up with traction and rotation reducing the pelvis.  We were pleased with leg length, offset, and stability on stressing the hip.  We then dislocated the hip and removed the trial components.  We were able to place the Corail femoral component size 12 with varus offset and the real 36+ 8.5 ceramic hip ball.  We reduced this in the acetabulum and again it was stable.  We then irrigated the soft tissue with normal saline solution using pulsatile lavage.  We closed the joint capsule with interrupted #1 Ethibond suture followed by #1 Vicryl in the tensor fascia, 0 Vicryl in the deep tissue, 2-0 Vicryl in the subcutaneous tissue, and interrupted staples on the skin.  Xeroform and Aquacel dressing were applied.  He was taken off the Hana table and taken to the recovery room in stable condition.  All final counts were correct. There were no complications noted.  Of note, Richardean CanalGilbert Clark, PA-C's assistance was crucial throughout all aspects of this case.     Vanita Pandahristopher Y. Magnus IvanBlackman, M.D.     CYB/MEDQ  D:  03/13/2016  T:  03/13/2016  Job:  [id number]

## 2016-03-14 ENCOUNTER — Encounter (HOSPITAL_COMMUNITY): Payer: Self-pay | Admitting: Orthopaedic Surgery

## 2016-03-14 LAB — BASIC METABOLIC PANEL
Anion gap: 6 (ref 5–15)
BUN: 12 mg/dL (ref 6–20)
CO2: 28 mmol/L (ref 22–32)
Calcium: 8.8 mg/dL — ABNORMAL LOW (ref 8.9–10.3)
Chloride: 102 mmol/L (ref 101–111)
Creatinine, Ser: 0.69 mg/dL (ref 0.61–1.24)
GFR calc Af Amer: >60 mL/min (ref >60)
GFR calc non Af Amer: >60 mL/min (ref >60)
Glucose, Bld: 135 mg/dL — ABNORMAL HIGH (ref 65–99)
POTASSIUM: 3.8 mmol/L (ref 3.5–5.1)
SODIUM: 136 mmol/L (ref 135–145)

## 2016-03-14 LAB — CBC
HCT: 38.2 % — ABNORMAL LOW (ref 39.0–52.0)
HEMOGLOBIN: 12.6 g/dL — AB (ref 13.0–17.0)
MCH: 31.7 pg (ref 26.0–34.0)
MCHC: 33 g/dL (ref 30.0–36.0)
MCV: 96 fL (ref 78.0–100.0)
Platelets: 212 10*3/uL (ref 150–400)
RBC: 3.98 MIL/uL — AB (ref 4.22–5.81)
RDW: 12 % (ref 11.5–15.5)
WBC: 11.2 10*3/uL — ABNORMAL HIGH (ref 4.0–10.5)

## 2016-03-14 NOTE — Evaluation (Signed)
Occupational Therapy Evaluation Patient Details Name: BRYTON ROMAGNOLI MRN: [id number] DOB: 09-Jan-1961 Today's Date: 03/14/2016    History of Present Illness 56 y.o. male admitted to [facility] on 03/13/16 for elective R direct anterior THA.  Pt with significant PMHx of HTN.    Clinical Impression   Pt with decline in function and safety with ADLs and ADL mobility with decreased balance, endurance and pt is limited by pain. Pt requires cues for correct hand placement and upright postural positioning during ADL mobility using RW. Pt requires extensive assist with LB ADLs and will have limited assist at home and wishes to d/c to a SNF for ST rehab. Pt would benefit from acute OT services to address impairments to increase level of function and safety    Follow Up Recommendations  SNF    Equipment Recommendations  None recommended by OT;Other (comment) (TBD at next venue of care)    Recommendations for Other Services       Precautions / Restrictions Precautions Precautions: None Restrictions Weight Bearing Restrictions: Yes RLE Weight Bearing: Weight bearing as tolerated      Mobility Bed Mobility Overal bed mobility: Needs Assistance Bed Mobility: Supine to Sit     Supine to sit: Min assist;HOB elevated     General bed mobility comments: pt up in recliner uppon arrival  Transfers Overall transfer level: Needs assistance Equipment used: Rolling walker (2 wheeled) Transfers: Sit to/from Stand Sit to Stand: Min assist         General transfer comment: Min assist to support trunk for balance during transition to stand.  Verbal cues for safe hand placement and for upright posture    Balance Overall balance assessment: Needs assistance Sitting-balance support: Feet supported;No upper extremity supported Sitting balance-Leahy Scale: Fair     Standing balance support: Bilateral upper extremity supported;Single extremity supported;During functional activity Standing  balance-Leahy Scale: Poor                              ADL Overall ADL's : Needs assistance/impaired     Grooming: Wash/dry hands;Wash/dry face;Min guard;Standing   Upper Body Bathing: Set up;Sitting   Lower Body Bathing: Maximal assistance   Upper Body Dressing : Set up;Sitting   Lower Body Dressing: Total assistance   Toilet Transfer: Minimal assistance;Ambulation;RW;Comfort height toilet;Cueing for safety   Toileting- Clothing Manipulation and Hygiene: Moderate assistance;Sit to/from stand;Cueing for safety   Tub/ Shower Transfer: Minimal assistance;Ambulation;Rolling walker;3 in 1;Cueing for safety   Functional mobility during ADLs: Minimal assistance;Cueing for safety;Rolling walker General ADL Comments: Initiated education on DME for tub shower and ADL A/E     Vision   Vision Assessment?: No apparent visual deficits            Pertinent Vitals/Pain Pain Assessment: 0-10 Pain Score: 7  Pain Location: R hip Pain Descriptors / Indicators: Aching;Sore Pain Intervention(s): Limited activity within patient's tolerance;Monitored during session;Premedicated before session;Repositioned;Ice applied     Hand Dominance Right   Extremity/Trunk Assessment Upper Extremity Assessment Upper Extremity Assessment: Overall WFL for tasks assessed   Lower Extremity Assessment Lower Extremity Assessment: Defer to PT evaluation RLE Deficits / Details: right leg with normal post op pain and weakness, ankle at least 4/5, knee 3-/5 (ext), hip 2/5 RLE Sensation:  (intact to LT)   Cervical / Trunk Assessment Cervical / Trunk Assessment: Normal   Communication Communication Communication: No difficulties   Cognition Arousal/Alertness: Awake/alert Behavior During Therapy: WFL for tasks  assessed/performed Overall Cognitive Status: Within Functional Limits for tasks assessed                     General Comments   pt pleasant and cooperative                 Home Living Family/patient expects to be discharged to:: Private residence Living Arrangements: Children Available Help at Discharge: Available PRN/intermittently Type of Home: House Home Access: Stairs to enter Secretary/administratorntrance Stairs-Number of Steps: 3 Entrance Stairs-Rails: Right Home Layout: One level     Bathroom Shower/Tub: Chief Strategy OfficerTub/shower unit   Bathroom Toilet: Standard     Home Equipment: Cane - single point          Prior Functioning/Environment Level of Independence: Independent with assistive device(s)    ADL's / Homemaking Assistance Needed: independent with ADLs/selfcare   Comments: used cane PTA        OT Problem List: Impaired balance (sitting and/or standing);Pain;Decreased activity tolerance;Decreased knowledge of use of DME or AE      OT Treatment/Interventions: Self-care/ADL training;DME and/or AE instruction;Therapeutic activities;Patient/family education    OT Goals(Current goals can be found in the care plan section) Acute Rehab OT Goals Patient Stated Goal: to lose weight and get back to basketball OT Goal Formulation: With patient/family Time For Goal Achievement: 03/21/16 Potential to Achieve Goals: Good ADL Goals Pt Will Perform Grooming: with supervision;with set-up;standing Pt Will Perform Lower Body Bathing: with mod assist;sitting/lateral leans;sit to/from stand Pt Will Perform Lower Body Dressing: with max assist;with mod assist;sitting/lateral leans;sit to/from stand Pt Will Transfer to Toilet: with min guard assist;ambulating Pt Will Perform Toileting - Clothing Manipulation and hygiene: with min assist;sit to/from stand  OT Frequency: Min 2X/week   Barriers to D/C: Decreased caregiver support                        End of Session Equipment Utilized During Treatment: Gait belt;Rolling walker;Other (comment) (3 in 1)  Activity Tolerance: Patient limited by pain Patient left: in chair;with call bell/phone within reach;with  family/visitor present  OT Visit Diagnosis: Other abnormalities of gait and mobility (R26.89);Pain Pain - Right/Left: Right Pain - part of body: Hip                ADL either performed or assessed with clinical judgement  Time: [id number] OT Time Calculation (min): 25 min Charges:  OT General Charges $OT Visit: 1 Procedure OT Evaluation $OT Eval Low Complexity: 1 Procedure OT Treatments $Therapeutic Activity: 8-22 mins G-Codes: OT G-codes **NOT FOR INPATIENT CLASS** Functional Assessment Tool Used: AM-PAC 6 Clicks Daily Activity     Galen ManilaSpencer, Elyssia Strausser Jeanette 03/14/2016, 1:45 PM

## 2016-03-14 NOTE — Progress Notes (Signed)
Physical Therapy Treatment Patient Details Name: Brandon MunroeGerald M Wallick MRN: [id number] DOB: 09-13-1960 Today's Date: 03/14/2016    History of Present Illness 56 y.o. male [facility] for elective R direct anterior THA.  Pt with significant PMHx of HTN.     PT Comments    Pt is POD #1 and this is his second session.  He was able to go further down the hallway despite pain and tightness in his right thigh.  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  PT will continue to progress mobility here acutely.    Follow Up Recommendations  SNF     Equipment Recommendations  Rolling walker with 5" wheels;3in1 (PT)    Recommendations for Other Services   NA     Precautions / Restrictions Precautions Precautions: None Restrictions Weight Bearing Restrictions: Yes RLE Weight Bearing: Weight bearing as tolerated    Mobility  Bed Mobility Overal bed mobility: Needs Assistance Bed Mobility: Supine to Sit     Supine to sit: Min assist;HOB elevated     General bed mobility comments: Min assist to help progress right leg over EOB.  Assist to provide hand for pt to pull up to sitting EOB.   HOB elevated.    Transfers Overall transfer level: Needs assistance Equipment used: Rolling walker (2 wheeled) Transfers: Sit to/from Stand Sit to Stand: Min assist         General transfer comment: Min assist to support trunk to get to standing.  Verbal cues for safe hand placement.   Ambulation/Gait Ambulation/Gait assistance: Min guard Ambulation Distance (Feet): 75 Feet Assistive device: Rolling walker (2 wheeled) Gait Pattern/deviations: Step-to pattern;Antalgic;Trunk flexed Gait velocity: decreased Gait velocity interpretation: Below normal speed for age/gender General Gait Details: Verbal cues for upright posture.  Pt reporting very sore and  tight right thigh.  Pt was able to progress gait fruther into the hallway.           Balance Overall balance assessment: Needs assistance Sitting-balance support: No upper extremity supported;Feet supported Sitting balance-Leahy Scale: Fair     Standing balance support: Bilateral upper extremity supported Standing balance-Leahy Scale: Poor                      Cognition Arousal/Alertness: Awake/alert Behavior During Therapy: WFL for tasks assessed/performed Overall Cognitive Status: Within Functional Limits for tasks assessed                      Exercises Total Joint Exercises Ankle Circles/Pumps: AROM;Both;20 reps Quad Sets: AROM;Right;10 reps Short Arc Quad: AROM;Right;20 reps Heel Slides: AAROM;Right;20 reps Hip ABduction/ADduction: AAROM;Right;10 reps Standing Hip Extension: AROM;Right;10 reps        Pertinent Vitals/Pain Pain Assessment: 0-10 Pain Score: 8  Pain Location: right hip  Pain Descriptors / Indicators: Aching;Sore (tight) Pain Intervention(s): Limited activity within patient's tolerance;Monitored during session;Repositioned    Home Living Family/patient expects to be discharged to:: Private residence Living Arrangements: Children Available Help at Discharge: Available PRN/intermittently Type of Home: House Home Access: Stairs to enter Entrance Stairs-Rails: Right Home Layout: One level Home Equipment: Cane - single point      Prior Function Level of Independence: Independent with assistive device(s)    ADL's / Homemaking Assistance Needed: independent with ADLs/selfcare Comments: used cane PTA  PT Goals (current goals can now be found in the care plan section) Acute Rehab PT Goals Patient Stated Goal: to lose weight and get back to basketball Progress towards PT goals: Progressing toward goals    Frequency    7X/week      PT Plan Current plan remains appropriate       End of Session Equipment Utilized During  Treatment: Gait belt Activity Tolerance: Patient limited by pain Patient left: in chair;with call bell/phone within reach Nurse Communication: Mobility status;Need for lift equipment;Other (comment) (increased temp) PT Visit Diagnosis: Unsteadiness on feet (R26.81);Muscle weakness (generalized) (M62.81);Difficulty in walking, not elsewhere classified (R26.2);Pain Pain - Right/Left: Right Pain - part of body: Hip     Time: 1610-9604 PT Time Calculation (min) (ACUTE ONLY): 38 min  Charges:  $Gait Training: 23-37 mins $Therapeutic Exercise: 8-22 mins                       Renesha Lizama B. Marisa Hage, PT, DPT [phone]   03/14/2016, 5:32 PM

## 2016-03-14 NOTE — Anesthesia Postprocedure Evaluation (Addendum)
Anesthesia Post Note  Patient: [name]  Procedure(s) Performed: Procedure(s) (LRB): RIGHT TOTAL HIP ARTHROPLASTY ANTERIOR APPROACH (Right)  Patient location during evaluation: PACU Anesthesia Type: General Level of consciousness: awake and alert Pain management: pain level controlled Vital Signs Assessment: post-procedure vital signs reviewed and stable Respiratory status: spontaneous breathing, nonlabored ventilation, respiratory function stable and patient connected to nasal cannula oxygen Cardiovascular status: blood pressure returned to baseline and stable Postop Assessment: no signs of nausea or vomiting Anesthetic complications: no       Last Vitals:  Vitals:   03/14/16 0000 03/14/16 0518  BP: (!) 158/88 (!) 149/74  Pulse: 78 85  Resp: 19 18  Temp: 37.2 C 36.7 C    Last Pain:  Vitals:   03/14/16 0658  TempSrc:   PainSc: 6                  Koleson Reifsteck,JAMES TERRILL

## 2016-03-14 NOTE — Progress Notes (Signed)
RN notified and Tylenol po given.

## 2016-03-14 NOTE — Progress Notes (Signed)
Subjective: 1 Day Post-Op Procedure(s) (LRB): RIGHT TOTAL HIP ARTHROPLASTY ANTERIOR APPROACH (Right) Patient reports pain as moderate.    Objective: Vital signs in last 24 hours: Temp:  [97.2 F (36.2 C)-99.2 F (37.3 C)] 98 F (36.7 C) (02/28 0518) Pulse Rate:  [73-89] 85 (02/28 0518) Resp:  [17-35] 18 (02/28 0518) BP: (141-174)/(74-97) 149/74 (02/28 0518) SpO2:  [79 %-100 %] 99 % (02/28 0518) Weight:  [304 lb (137.9 kg)] 304 lb (137.9 kg) (02/27 1013)  Intake/Output from previous day: 02/27 0701 - 02/28 0700 In: 1510 [I.V.:1100; IV Piggyback:410] Out: 1550 [Urine:900; Blood:650] Intake/Output this shift: Total I/O In: -  Out: 400 [Urine:400]  No results for input(s): HGB in the last 72 hours. No results for input(s): WBC, RBC, HCT, PLT in the last 72 hours. No results for input(s): NA, K, CL, CO2, BUN, CREATININE, GLUCOSE, CALCIUM in the last 72 hours. No results for input(s): LABPT, INR in the last 72 hours.  Sensation intact distally Intact pulses distally Dorsiflexion/Plantar flexion intact Incision: scant drainage  Assessment/Plan: 1 Day Post-Op Procedure(s) (LRB): RIGHT TOTAL HIP ARTHROPLASTY ANTERIOR APPROACH (Right) Up with therapy  Social Work Consult to assess for skilled nursing.  Kathryne HitchChristopher Y Virgil Lightner 03/14/2016, 6:58 AM

## 2016-03-14 NOTE — Evaluation (Signed)
Physical Therapy Evaluation Patient Details Name: Brandon Finley MRN: [id number] DOB: [birth year] Today's Date: 03/14/2016   History of Present Illness  56 y.o. male admitted to [facility] on 03/13/16 for elective R direct anterior THA.  Pt with significant PMHx of HTN.   Clinical Impression  Pt is POD #1 and was able to get into the hallway with RW and min assist.  He has limited help at home and is interested in pursuing rehab at a SNF at discharge.   PT to follow acutely for deficits listed below.       Follow Up Recommendations SNF    Equipment Recommendations  Rolling walker with 5" wheels;3in1 (PT)    Recommendations for Other Services   NA    Precautions / Restrictions Precautions Precautions: None Restrictions RLE Weight Bearing: Weight bearing as tolerated      Mobility  Bed Mobility Overal bed mobility: Needs Assistance Bed Mobility: Supine to Sit     Supine to sit: Min assist;HOB elevated     General bed mobility comments: Min assist to help progress right leg to EOB and then to provide a hand to pull up to sitting EOB.  Pt using HOB elevated and railing for support at trunk.   Transfers Overall transfer level: Needs assistance Equipment used: Rolling walker (2 wheeled) Transfers: Sit to/from Stand Sit to Stand: Min assist         General transfer comment: Min assist to support trunk for balance during transition to stand.  Verbal cues for safe hand placement.   Ambulation/Gait Ambulation/Gait assistance: Min assist Ambulation Distance (Feet): 55 Feet Assistive device: Rolling walker (2 wheeled) Gait Pattern/deviations: Step-to pattern;Antalgic Gait velocity: decreased Gait velocity interpretation: Below normal speed for age/gender General Gait Details: Pt with moderately antalgic gait pattern with flexed trunk. RW adjusted to fit height and arm length.  Pt needed cues for safe RW use and upright posture.          Balance Overall balance assessment:  Needs assistance Sitting-balance support: Feet supported;No upper extremity supported Sitting balance-Leahy Scale: Fair     Standing balance support: Bilateral upper extremity supported Standing balance-Leahy Scale: Poor                               Pertinent Vitals/Pain Pain Assessment: 0-10 Pain Score: 8  Pain Location: right leg Pain Descriptors / Indicators: Aching;Burning Pain Intervention(s): Limited activity within patient's tolerance;Monitored during session;Repositioned;Ice applied    Home Living Family/patient expects to be discharged to:: Private residence Living Arrangements: Children (daughter) Available Help at Discharge: Available PRN/intermittently (daughter works and goes to school) Type of Home: House Home Access: Stairs to enter Entrance Stairs-Rails: Engineer, manufacturing of Steps: 3 Home Layout: One level Home Equipment: Cane - single point      Prior Function Level of Independence: Independent with assistive device(s)         Comments: used cane PTA        Extremity/Trunk Assessment   Upper Extremity Assessment Upper Extremity Assessment: Defer to OT evaluation    Lower Extremity Assessment Lower Extremity Assessment: RLE deficits/detail RLE Deficits / Details: right leg with normal post op pain and weakness, ankle at least 4/5, knee 3-/5 (ext), hip 2/5 RLE Sensation:  (intact to LT)    Cervical / Trunk Assessment Cervical / Trunk Assessment: Normal  Communication   Communication: No difficulties  Cognition Arousal/Alertness: Awake/alert Behavior During Therapy: WFL for tasks assessed/performed  Overall Cognitive Status: Within Functional Limits for tasks assessed                         Exercises Total Joint Exercises Ankle Circles/Pumps: AROM;Both;20 reps Quad Sets: AROM;Both;10 reps Heel Slides: AAROM;Both;20 reps Long Arc Quad: AROM;Right;10 reps   Assessment/Plan    PT Assessment Patient needs  continued PT services  PT Problem List Decreased strength;Decreased range of motion;Decreased activity tolerance;Decreased balance;Decreased mobility;Decreased knowledge of use of DME;Pain;Obesity       PT Treatment Interventions DME instruction;Gait training;Functional mobility training;Stair training;Therapeutic exercise;Therapeutic activities;Balance training;Patient/family education;Manual techniques;Modalities    PT Goals (Current goals can be found in the Care Plan section)  Acute Rehab PT Goals Patient Stated Goal: to lose weight and get back to basketball PT Goal Formulation: With patient Time For Goal Achievement: 03/21/16 Potential to Achieve Goals: Good    Frequency 7X/week   Barriers to discharge Decreased caregiver support pt has very limited help at home due to daughter working and going to school.        End of Session   Activity Tolerance: Patient limited by pain Patient left: in chair;with call bell/phone within reach;with family/visitor present Nurse Communication: Mobility status PT Visit Diagnosis: Unsteadiness on feet (R26.81);Muscle weakness (generalized) (M62.81);Difficulty in walking, not elsewhere classified (R26.2);Pain Pain - Right/Left: Right Pain - part of body: Hip         Time: 1026-1059 PT Time Calculation (min) (ACUTE ONLY): 33 min   Charges:   PT Evaluation $PT Eval Low Complexity: 1 Procedure PT Treatments $Gait Training: 8-22 mins         Dejean Tribby B. Imer Foxworth, PT, DPT [phone]   03/14/2016, 11:09 AM

## 2016-03-15 MED ORDER — ASPIRIN 81 MG PO CHEW: 81.0000 mg | CHEWABLE_TABLET | Freq: Two times a day (BID) | ORAL | 0 refills | Status: DC

## 2016-03-15 MED ORDER — OXYCODONE-ACETAMINOPHEN 5-325 MG PO TABS: 1.0000 | ORAL_TABLET | ORAL | 0 refills | Status: DC | PRN

## 2016-03-15 MED ORDER — CYCLOBENZAPRINE HCL 10 MG PO TABS: 10.0000 mg | ORAL_TABLET | Freq: Three times a day (TID) | ORAL | 0 refills | Status: DC | PRN

## 2016-03-15 NOTE — Discharge Instructions (Signed)

## 2016-03-15 NOTE — Care Management Note (Signed)
Case Management Note  Patient Details  Name: Brandon Finley MRN: [id number] Date of Birth: 08-18-1960  Subjective/Objective:   56 yr old gentleman s/p right total hip, anterior approach.                 Action/Plan: Case manager spoke with patient concerning discharge plan. Patient initially wanted to go to SNF for shortterm rehab. He has no insurance . Patient has progressed with therapy and will be discharged to home. CM called referral to Janeice RobinsonKaren Nusbaumm, [facility] Liaison. CM has ordered DME. Patient states his daughter will assist him at times.    Expected Discharge Date:    03/16/16              Expected Discharge Plan:  Home w Home Health Services  In-House Referral:  NA  Discharge planning Services  CM Consult  Post Acute Care Choice:  Durable Medical Equipment, Home Health Choice offered to:  Patient  DME Arranged:  3-N-1, Walker rolling DME Agency:  [facility]  [facility]  [facility]  Status of Service:  Completed, signed off  If discussed at Long Length of Stay Meetings, dates discussed:    Additional Comments:  Durenda GuthrieBrady, [name] [name], RN 03/15/2016, 2:07 PM

## 2016-03-15 NOTE — Progress Notes (Signed)
Subjective: 2 Days Post-Op Procedure(s) (LRB): RIGHT TOTAL HIP ARTHROPLASTY ANTERIOR APPROACH (Right) Patient reports pain as moderate.    Objective: Vital signs in last 24 hours: Temp:  [98.4 F (36.9 C)-101.1 F (38.4 C)] 99.8 F (37.7 C) (03/01 0553) Pulse Rate:  [74-103] 103 (03/01 0553) Resp:  [16-18] 16 (03/01 0553) BP: (144-146)/(72-83) 144/73 (03/01 0553) SpO2:  [95 %-99 %] 97 % (03/01 0553)  Intake/Output from previous day: 02/28 0701 - 03/01 0700 In: 1080 [P.O.:1080] Out: 1000 [Urine:1000] Intake/Output this shift: No intake/output data recorded.   Recent Labs  03/14/16 0756  HGB 12.6*    Recent Labs  03/14/16 0756  WBC 11.2*  RBC 3.98*  HCT 38.2*  PLT 212    Recent Labs  03/14/16 0756  NA 136  K 3.8  CL 102  CO2 28  BUN 12  CREATININE 0.69  GLUCOSE 135*  CALCIUM 8.8*   No results for input(s): LABPT, INR in the last 72 hours.  Sensation intact distally Intact pulses distally Dorsiflexion/Plantar flexion intact Incision: scant drainage  Assessment/Plan: 2 Days Post-Op Procedure(s) (LRB): RIGHT TOTAL HIP ARTHROPLASTY ANTERIOR APPROACH (Right) D/C IV fluids Plan for discharge tomorrow Discharge to SNF  Kathryne HitchChristopher Y Trudie Cervantes 03/15/2016, 11:38 AM

## 2016-03-15 NOTE — Progress Notes (Signed)
Physical Therapy Treatment Patient Details Name: Brandon Finley MRN: [id number] DOB: 16-Mar-1960 Today's Date: 03/15/2016    History of Present Illness 56 y.o. male admitted to [facility] on 03/13/16 for elective R direct anterior THA.  Pt with significant PMHx of HTN.     PT Comments    Pt progressing with gait distances although pain still 7/10. He appears to be highly motivated and has good safety awareness.  Therapy will continue to follow to assist with discharge planning and follow up recommendations.Will continue to follow patient while on this venue of care to progress mobility    Follow Up Recommendations  SNF     Equipment Recommendations  Rolling walker with 5" wheels;3in1 (PT)    Recommendations for Other Services       Precautions / Restrictions Precautions Precautions: None Restrictions Weight Bearing Restrictions: Yes RLE Weight Bearing: Weight bearing as tolerated    Mobility  Bed Mobility               General bed mobility comments: OOB upon arrival  Transfers Overall transfer level: Needs assistance Equipment used: Rolling walker (2 wheeled) Transfers: Sit to/from [name] Sit to Stand: Min guard Stand pivot transfers: Min guard       General transfer comment: min cues for Rt leg and hand placement  Ambulation/Gait Ambulation/Gait assistance: Min guard Ambulation Distance (Feet): 160 Feet Assistive device: Rolling walker (2 wheeled) Gait Pattern/deviations: Step-to pattern;Antalgic;Trunk flexed;Decreased stance time - right;Decreased dorsiflexion - right     General Gait Details: unable to fully weight bear Rt leg, heavy reliance on external support, occasional standing rest breaks to manage arm fatigue   Stairs            Wheelchair Mobility    Modified Rankin (Stroke Patients Only)       Balance Overall balance assessment: Needs assistance Sitting-balance support: No upper extremity supported;Feet  supported Sitting balance-Leahy Scale: Good Sitting balance - Comments: able to wash self in sitting at end of session   Standing balance support: No upper extremity supported Standing balance-Leahy Scale: Poor Standing balance comment: reliance on external support during dynamic balance tasks, supervision to stand to urinate using urinal                    Cognition Arousal/Alertness: Awake/alert Behavior During Therapy: WFL for tasks assessed/performed Overall Cognitive Status: Within Functional Limits for tasks assessed                 General Comments: Pt appears to be highly motivated    Exercises      General Comments        Pertinent Vitals/Pain Pain Assessment: 0-10 Pain Score: 7  Pain Location: right hip  Pain Descriptors / Indicators: Aching;Sore;Tightness Pain Intervention(s): Limited activity within patient's tolerance;Monitored during session;Patient requesting pain meds-RN notified;Ice applied    Home Living                      Prior Function            PT Goals (current goals can now be found in the care plan section) Acute Rehab PT Goals Patient Stated Goal: regain strength before going home PT Goal Formulation: With patient Time For Goal Achievement: 03/21/16 Potential to Achieve Goals: Good Progress towards PT goals: Progressing toward goals    Frequency    7X/week      PT Plan Current plan remains appropriate    Co-evaluation  End of Session Equipment Utilized During Treatment: Gait belt Activity Tolerance: Patient limited by pain Patient left: in chair;with call bell/phone within reach Nurse Communication: Mobility status;Patient requests pain meds PT Visit Diagnosis: Unsteadiness on feet (R26.81);Muscle weakness (generalized) (M62.81);Difficulty in walking, not elsewhere classified (R26.2);Pain Pain - Right/Left: Right Pain - part of body: Hip     Time: 1610-96041209-1245 PT Time Calculation (min)  (ACUTE ONLY): 36 min  Charges:  $Gait Training: 8-22 mins $Therapeutic Activity: 8-22 mins                    G CodesCarrie Mew:    n/a  Shan Padgett M Haya Hemler, PT [phone]  Amr Sturtevant 03/15/2016, 1:45 PM

## 2016-03-15 NOTE — Progress Notes (Signed)
Physical Therapy Treatment Patient Details Name: Brandon Finley MRN: [id number] DOB: 09-Apr-1960 Today's Date: 03/15/2016    History of Present Illness 56 y.o. male admitted to [facility] on 03/13/16 for elective R direct anterior THA.  Pt with significant PMHx of HTN.     PT Comments    Pt progressing with generalized mobility although limited by ongoing Rt hip pain.  Pt agreeable to updated plan to have HHPT and he reports that his dau will be able to provide prepared meals for when he is without direct assist during the day.  Pt has received his home equipment.  Will follow up with pt tomorrow to ensure he has opportunity to practice further mobility prior to discharge home.    Follow Up Recommendations  Home health PT;Supervision - Intermittent     Equipment Recommendations  Rolling walker with 5" wheels;3in1 (PT)    Recommendations for Other Services       Precautions / Restrictions Precautions Precautions: None Restrictions Weight Bearing Restrictions: Yes RLE Weight Bearing: Weight bearing as tolerated    Mobility  Bed Mobility Overal bed mobility: Modified Independent Bed Mobility: Supine to Sit           General bed mobility comments: wtih raised HOB  Transfers Overall transfer level: Needs assistance Equipment used: Rolling walker (2 wheeled) Transfers: Sit to/from [name] Sit to Stand: Supervision Stand pivot transfers: Supervision       General transfer comment: good transfer safety  Ambulation/Gait Ambulation/Gait assistance: Supervision Ambulation Distance (Feet): 165 Feet Assistive device: Rolling walker (2 wheeled) Gait Pattern/deviations: Step-through pattern;Antalgic;Decreased stance time - right;Decreased weight shift to right Gait velocity: decreased   General Gait Details: used pt's new personal walker, fitted to correct height   Stairs            Wheelchair Mobility    Modified Rankin (Stroke Patients  Only)       Balance Overall balance assessment: Needs assistance Sitting-balance support: No upper extremity supported;Feet unsupported Sitting balance-Leahy Scale: Good     Standing balance support: No upper extremity supported Standing balance-Leahy Scale: Fair Standing balance comment: static stand without UE support, external support during dynamic tasks                    Cognition Arousal/Alertness: Awake/alert Behavior During Therapy: WFL for tasks assessed/performed Overall Cognitive Status: Within Functional Limits for tasks assessed                      Exercises      General Comments        Pertinent Vitals/Pain Pain Assessment: 0-10 Pain Score: 8  Pain Location: right hip  Pain Descriptors / Indicators: Aching;Sore;Tightness Pain Intervention(s): Limited activity within patient's tolerance;Monitored during session;Patient requesting pain meds-RN notified;Ice applied    Home Living                      Prior Function            PT Goals (current goals can now be found in the care plan section) Acute Rehab PT Goals Patient Stated Goal: regain strength PT Goal Formulation: With patient Time For Goal Achievement: 03/21/16 Potential to Achieve Goals: Good Progress towards PT goals: Progressing toward goals    Frequency    7X/week      PT Plan Discharge plan needs to be updated    Co-evaluation  End of Session Equipment Utilized During Treatment: Gait belt Activity Tolerance: Patient limited by fatigue;Patient tolerated treatment well Patient left: in chair;with call bell/phone within reach;with family/visitor present Nurse Communication: Mobility status PT Visit Diagnosis: Unsteadiness on feet (R26.81);Muscle weakness (generalized) (M62.81);Difficulty in walking, not elsewhere classified (R26.2);Pain Pain - Right/Left: Right Pain - part of body: Hip     Time: [id number] PT Time Calculation (min)  (ACUTE ONLY): 26 min  Charges:  $Gait Training: 23-37 mins                    G CodesCarrie Mew:    n/a  Lucrezia Dehne M Twylia Oka, PT [phone]  Chaske Paskett 03/15/2016, 5:05 PM

## 2016-03-15 NOTE — Clinical Social Work Note (Signed)
CSW spoke with director about possible LOG. Due to the length the pt can ambulate, at this time we will not grant a LOG. Pt notified. RNCM notified. [facility] Clinical Social Worker will sign off for now as social work intervention is no longer needed. Please consult us again if new need arises.  [address]Bridget Mayton, ConnecticutLCSWA [phone]

## 2016-03-16 MED FILL — OXYCODONE W/APAP 5/325 TAB: 5-325 | 5 days supply | Qty: 60 | Fill #0

## 2016-03-16 MED FILL — ?CYCLOBENZAPRINE 10 MG TABL: 10 | 20 days supply | Qty: 60 | Fill #0

## 2016-03-16 NOTE — Progress Notes (Signed)
AVS discharge instructions were reviewed with pt.  Pt was given perscription for percocet, flexeril, and aspirin to take to his pharmacy. Pt denies having any questions. Volunteers called to assist pt to his transportation.

## 2016-03-16 NOTE — Progress Notes (Signed)
OT Cancellation Note  Patient Details Name: Brandon MunroeGerald M Tailor MRN: [id number] DOB: Jul 23, 1960   Cancelled Treatment:    Reason Eval/Treat Not Completed: Patient declined, no reason specified . Pt preparing to d/c home this a.m. after PT Galen ManilaSpencer, Jquan Egelston Jeanette 03/16/2016, 9:59 AM

## 2016-03-16 NOTE — Progress Notes (Signed)
Physical Therapy Treatment Patient Details Name: Brandon MunroeGerald M Crigler MRN: [id number] DOB: May 16, 1960 Today's Date: 03/16/2016    History of Present Illness 56 y.o. male [facility] for elective R direct anterior THA.  Pt with significant PMHx of HTN.     PT Comments    This session focused on gait and stair training. Pt able to negotiate steps with min guard assist. Reviewed HEP and activity progression. Continue to progress as tolerated with anticipated d/c home with HHPT.    Follow Up Recommendations  Home health PT;Supervision - Intermittent     Equipment Recommendations  Rolling walker with 5" wheels;3in1 (PT)    Recommendations for Other Services       Precautions / Restrictions Precautions Precautions: None Restrictions Weight Bearing Restrictions: Yes RLE Weight Bearing: Weight bearing as tolerated    Mobility  Bed Mobility               General bed mobility comments: pt OOB in chair upon arrival  Transfers Overall transfer level: Needs assistance Equipment used: Rolling walker (2 wheeled) Transfers: Sit to/from Stand Sit to Stand: Supervision         General transfer comment: safe hand placement  Ambulation/Gait Ambulation/Gait assistance: Supervision Ambulation Distance (Feet): 150 Feet Assistive device: Rolling walker (2 wheeled) Gait Pattern/deviations: Step-through pattern;Antalgic;Decreased stance time - right;Decreased weight shift to right;Decreased step length - left Gait velocity: decreased   General Gait Details: mildly antalgic gait; cues for posture, proximity of RW and sequencing; pt with improved step length symmetry with increased distance   Stairs Stairs: Yes   Stair Management: Two rails;Step to pattern;Forwards Number of Stairs:  (2 steps X2) General stair comments: cues for sequencing and technique; min guard for safety  Wheelchair Mobility    Modified Rankin (Stroke Patients Only)       Balance  Overall balance assessment: Needs assistance Sitting-balance support: No upper extremity supported;Feet unsupported Sitting balance-Leahy Scale: Good     Standing balance support: No upper extremity supported Standing balance-Leahy Scale: Fair Standing balance comment: static stand without UE support, external support during dynamic tasks                    Cognition Arousal/Alertness: Awake/alert Behavior During Therapy: WFL for tasks assessed/performed Overall Cognitive Status: Within Functional Limits for tasks assessed                      Exercises      General Comments General comments (skin integrity, edema, etc.): reviewed HEP and activity progression      Pertinent Vitals/Pain Pain Assessment: Faces Faces Pain Scale: Hurts even more Pain Location: right hip with mobility Pain Descriptors / Indicators: Aching;Sore;Tightness;Grimacing;Guarding Pain Intervention(s): Monitored during session;Premedicated before session;Repositioned    Home Living                      Prior Function            PT Goals (current goals can now be found in the care plan section) Acute Rehab PT Goals Patient Stated Goal: less pain and more activity PT Goal Formulation: With patient Time For Goal Achievement: 03/21/16 Potential to Achieve Goals: Good Progress towards PT goals: Progressing toward goals    Frequency    7X/week      PT Plan Current plan remains appropriate    Co-evaluation             End of Session Equipment  Utilized During Treatment: Gait belt Activity Tolerance: Patient tolerated treatment well Patient left: in chair;with call bell/phone within reach;with family/visitor present Nurse Communication: Mobility status PT Visit Diagnosis: Unsteadiness on feet (R26.81);Muscle weakness (generalized) (M62.81);Difficulty in walking, not elsewhere classified (R26.2);Pain Pain - Right/Left: Right Pain - part of body: Hip     Time:  1610-9604 PT Time Calculation (min) (ACUTE ONLY): 25 min  Charges:  $Gait Training: 23-37 mins                    G Codes:       Derek Mound, PTA Pager: [phone]   03/16/2016, 10:01 AM

## 2016-03-16 NOTE — Progress Notes (Signed)
Subjective: 3 Days Post-Op Procedure(s) (LRB): RIGHT TOTAL HIP ARTHROPLASTY ANTERIOR APPROACH (Right) Patient reports pain as moderate.    Objective: Vital signs in last 24 hours: Temp:  [98.1 F (36.7 C)-99.3 F (37.4 C)] 98.1 F (36.7 C) (03/02 0621) Pulse Rate:  [93-97] 93 (03/02 0621) Resp:  [17-18] 17 (03/02 0621) BP: (136-148)/(64-83) 148/83 (03/02 0621) SpO2:  [93 %-98 %] 93 % (03/02 0621)  Intake/Output from previous day: 03/01 0701 - 03/02 0700 In: 660 [P.O.:660] Out: 900 [Urine:900] Intake/Output this shift: Total I/O In: 180 [P.O.:180] Out: 250 [Urine:250]   Recent Labs  03/14/16 0756  HGB 12.6*    Recent Labs  03/14/16 0756  WBC 11.2*  RBC 3.98*  HCT 38.2*  PLT 212    Recent Labs  03/14/16 0756  NA 136  K 3.8  CL 102  CO2 28  BUN 12  CREATININE 0.69  GLUCOSE 135*  CALCIUM 8.8*   No results for input(s): LABPT, INR in the last 72 hours.  Sensation intact distally Intact pulses distally Dorsiflexion/Plantar flexion intact Incision: scant drainage  Assessment/Plan: 3 Days Post-Op Procedure(s) (LRB): RIGHT TOTAL HIP ARTHROPLASTY ANTERIOR APPROACH (Right) Up with therapy Discharge home with home health  Medicaid would not cover SNF.  Brandon Finley 03/16/2016, 6:57 AM

## 2016-03-16 NOTE — Discharge Summary (Signed)
Patient ID: Brandon Finley MRN: [id number] DOB/AGE: Feb 25, 1960 56 y.o.  Admit date: 03/13/2016 Discharge date: 03/16/2016  Admission Diagnoses:  Principal Problem:   Post-traumatic osteoarthritis of right hip Active Problems:   Status post total replacement of right hip   Discharge Diagnoses:  Same  Past Medical History:  Diagnosis Date  . Arthritis   . Hypertension     Surgeries: Procedure(s): RIGHT TOTAL HIP ARTHROPLASTY ANTERIOR APPROACH on 03/13/2016   Consultants:   Discharged Condition: Improved  Hospital Course: Brandon Finley is an 56 y.o. male who was admitted 03/13/2016 for operative treatment ofPost-traumatic osteoarthritis of right hip. Patient has severe unremitting pain that affects sleep, daily activities, and work/hobbies. After pre-op clearance the patient was taken to the operating room on 03/13/2016 and underwent  Procedure(s): RIGHT TOTAL HIP ARTHROPLASTY ANTERIOR APPROACH.    Patient was given perioperative antibiotics: Anti-infectives    Start     Dose/Rate Route Frequency Ordered Stop   03/13/16 1800  ceFAZolin (ANCEF) IVPB 2g/100 mL premix     2 g 200 mL/hr over 30 Minutes Intravenous Every 6 hours 03/13/16 1646 03/14/16 0559   03/13/16 1245  ceFAZolin (ANCEF) 3 g in dextrose 5 % 50 mL IVPB     3 g 130 mL/hr over 30 Minutes Intravenous On call to O.R. 03/12/16 1055 03/13/16 1205       Patient was given sequential compression devices, early ambulation, and chemoprophylaxis to prevent DVT.  Patient benefited maximally from hospital stay and there were no complications.    Recent vital signs: Patient Vitals for the past 24 hrs:  BP Temp Temp src Pulse Resp SpO2  03/16/16 0621 (!) 148/83 98.1 F (36.7 C) Oral 93 17 93 %  03/15/16 2052 (!) 142/64 99.3 F (37.4 C) Oral 96 17 95 %  03/15/16 1441 136/72 98.7 F (37.1 C) Oral 97 18 98 %     Recent laboratory studies:  Recent Labs  03/14/16 0756  WBC 11.2*  HGB 12.6*  HCT 38.2*  PLT 212   NA 136  K 3.8  CL 102  CO2 28  BUN 12  CREATININE 0.69  GLUCOSE 135*  CALCIUM 8.8*     Discharge Medications:   Allergies as of 03/16/2016      Reactions   No Known Allergies       Medication List    STOP taking these medications   meloxicam 15 MG tablet Commonly known as:  MOBIC     TAKE these medications   acetaminophen 325 MG tablet Commonly known as:  TYLENOL Take 2 tablets (650 mg total) by mouth every 6 (six) hours as needed. What changed:  reasons to take this   aspirin 81 MG chewable tablet Chew 1 tablet (81 mg total) by mouth 2 (two) times daily.   carbamide peroxide 6.5 % otic solution Commonly known as:  DEBROX Place 5 drops into the right ear 2 (two) times daily. What changed:  how to take this  when to take this  reasons to take this   cyclobenzaprine 10 MG tablet Commonly known as:  FLEXERIL Take 1 tablet (10 mg total) by mouth 3 (three) times daily as needed for muscle spasms. What changed:  when to take this   hydrochlorothiazide 12.5 MG capsule Commonly known as:  MICROZIDE Take 1 capsule (12.5 mg total) by mouth daily.   HYDROcodone-acetaminophen 5-325 MG tablet Commonly known as:  NORCO/VICODIN Take 1-2 tablets by mouth every 6 (six) hours as needed for severe  pain. What changed:  reasons to take this   naproxen 500 MG tablet Commonly known as:  NAPROSYN Take 1 tablet (500 mg total) by mouth 2 (two) times daily with a meal.   oxyCODONE-acetaminophen 5-325 MG tablet Commonly known as:  PERCOCET Take 1-2 tablets by mouth every 4 (four) hours as needed.   traMADol 50 MG tablet Commonly known as:  ULTRAM Take 1 tablet (50 mg total) by mouth every 6 (six) hours as needed. What changed:  reasons to take this            Durable Medical Equipment        Start     Ordered   03/13/16 1647  DME Walker rolling  Once    Question:  Patient needs a walker to treat with the following condition  Answer:  Status post total replacement  of right hip   03/13/16 1646   03/13/16 1647  DME 3 n 1  Once     03/13/16 1646      Diagnostic Studies: Dg Pelvis Portable  Result Date: 03/13/2016 CLINICAL DATA:  Right total hip replacement EXAM: PORTABLE PELVIS 1-2 VIEWS COMPARISON:  Pelvis and right hip films of 08/19/2015 FINDINGS: The acetabular and femoral components of the right total hip replacement appear to be in good position. No complicating features are seen. Moderate degenerative joint disease of the left hip is noted. IMPRESSION: Right total hip replacement components in good position with no complicating features. Electronically Signed   By: Dwyane DeePaul  Barry M.D.   On: 03/13/2016 15:30   Dg C-arm 1-60 Min  Result Date: 03/13/2016 CLINICAL DATA:  Right hip replacement EXAM: OPERATIVE RIGHT HIP WITH PELVIS; DG C-ARM 61-120 MIN COMPARISON:  None. FLUOROSCOPY TIME:  Fluoroscopy Time:  1 minutes 3 seconds Radiation Exposure Index (if provided by the fluoroscopic device): Not available Number of Acquired Spot Images: 3 FINDINGS: Right hip replacement is noted in satisfactory position. No acute bony or soft tissue abnormality is noted. IMPRESSION: Status post right hip replacement Electronically Signed   By: Alcide CleverMark  Lukens M.D.   On: 03/13/2016 14:22   Dg Hip Operative Unilat W Or W/o Pelvis Right  Result Date: 03/13/2016 CLINICAL DATA:  Right hip replacement EXAM: OPERATIVE RIGHT HIP WITH PELVIS; DG C-ARM 61-120 MIN COMPARISON:  None. FLUOROSCOPY TIME:  Fluoroscopy Time:  1 minutes 3 seconds Radiation Exposure Index (if provided by the fluoroscopic device): Not available Number of Acquired Spot Images: 3 FINDINGS: Right hip replacement is noted in satisfactory position. No acute bony or soft tissue abnormality is noted. IMPRESSION: Status post right hip replacement Electronically Signed   By: Alcide CleverMark  Lukens M.D.   On: 03/13/2016 14:22    Disposition: 01-Home or Self Care  Discharge Instructions    Discharge patient    Complete by:  As  directed    Discharge disposition:  01-Home or Self Care   Discharge patient date:  03/16/2016      Follow-up Information    [facility] Follow up.   Why:  A representative  from[facility] will contact you to arrange start date and time for therapy. Contact information: [address] [place] [address] [phone]        Kathryne Hitchhristopher Y Blackman, MD Follow up in 2 week(s).   Specialty:  Orthopedic Surgery Contact information: [address] [place] [address] [phone]            Signed: Kathryne HitchChristopher Y Blackman 03/16/2016, 6:59 AM

## 2016-03-19 ENCOUNTER — Telehealth (INDEPENDENT_AMBULATORY_CARE_PROVIDER_SITE_OTHER): Payer: Self-pay | Admitting: Radiology

## 2016-03-19 NOTE — Telephone Encounter (Signed)
PT aware of below message from Dr. Magnus IvanBlackman

## 2016-03-19 NOTE — Telephone Encounter (Signed)
Needs verbal for 1week x1,  3week x1 and 1week x 1---The patient is  wanting to change dressing, Donnamarie PoagJeanne states there are no orders about changing the dressing.   Patient is extremely constipated and he has Mirlax at home-- I advised to start that.

## 2016-03-19 NOTE — Telephone Encounter (Signed)
I did give him one to change today or tomorrow.  I'm am having obese patients who we close with staples change their dressings at one week post-op.

## 2016-03-19 NOTE — Telephone Encounter (Signed)
Patient states that you gave him extra dressing to change with? I told the PT we don't usually change until the day we take it off on the 1st post op? Can you help clear this up?

## 2016-03-27 ENCOUNTER — Ambulatory Visit (INDEPENDENT_AMBULATORY_CARE_PROVIDER_SITE_OTHER): Payer: Self-pay | Admitting: Orthopaedic Surgery

## 2016-03-27 DIAGNOSIS — Z96641 Presence of right artificial hip joint: Secondary | ICD-10-CM

## 2016-03-27 MED ORDER — OXYCODONE-ACETAMINOPHEN 5-325 MG PO TABS: 1.0000 | ORAL_TABLET | Freq: Four times a day (QID) | ORAL | 0 refills | Status: DC | PRN

## 2016-03-27 NOTE — Progress Notes (Signed)
Mr. Antonietta BarcelonaHammons is 2 weeks status post a right total hip arthroplasty direct injury approach. He is ambulating with a rolling walker. He says he is doing well.  On examination his incision looks good. I removed the staples and placed Steri-Strips. There is no evidence of infection. There was a hematoma but no seroma. Leg lengths are equal. We did not obtain any x-rays today.  At this point a continued increase his activities. He'll go back to just his baby daily aspirin. I gave him a refill on his Percocet and counseled him on narcotics use. See him back in a month to see how is doing overall. No x-rays are needed.

## 2016-04-19 ENCOUNTER — Telehealth: Payer: Self-pay

## 2016-04-19 NOTE — Telephone Encounter (Signed)
Contacted pt to find out if he would like to have colonoscopy scheduled

## 2016-04-20 ENCOUNTER — Telehealth: Payer: Self-pay

## 2016-04-20 NOTE — Telephone Encounter (Signed)
Patient called stating that he does not want to have colonoscopy scheduled yet. He will call when he is ready to have procedure done.

## 2016-04-25 ENCOUNTER — Encounter (INDEPENDENT_AMBULATORY_CARE_PROVIDER_SITE_OTHER): Payer: Self-pay

## 2016-04-25 ENCOUNTER — Ambulatory Visit (INDEPENDENT_AMBULATORY_CARE_PROVIDER_SITE_OTHER): Payer: Self-pay | Admitting: Orthopaedic Surgery

## 2016-04-25 ENCOUNTER — Telehealth (INDEPENDENT_AMBULATORY_CARE_PROVIDER_SITE_OTHER): Payer: Self-pay | Admitting: *Deleted

## 2016-04-25 DIAGNOSIS — Z96641 Presence of right artificial hip joint: Secondary | ICD-10-CM

## 2016-04-25 MED ORDER — HYDROCODONE-ACETAMINOPHEN 5-325 MG PO TABS: 1.0000 | ORAL_TABLET | Freq: Three times a day (TID) | ORAL | 0 refills | Status: DC | PRN

## 2016-04-25 MED FILL — HYDROCODON-APAP 5-325: 5-325 | 10 days supply | Qty: 60 | Fill #0

## 2016-04-25 NOTE — Progress Notes (Signed)
The patient is now 6 weeks status post a right total hip arthroplasty direct injury approach. He is morbidly obese individual he is doing well. He still walks with a walking stick. He says she needs stronger pain medications. He did want a new handicap plaque as well.  On examination his hip incisions healed nicely. Tolle her swimming moving his hip around pretty easily.  A long and thorough discussion was had about narcotics narcotic use. He understands after my discussion when he DEXA weaning him down from narcotics. I'll provide hydrocodone today. We'll see him back in a month's he is doing overall but no x-rays are needed.

## 2016-04-25 NOTE — Telephone Encounter (Signed)
No. I am giving him hydrocodone at this standpoint.

## 2016-04-25 NOTE — Telephone Encounter (Signed)
That is the same medication as percocet, but just less strong.  If he wants percocet at this point, he needs to understand that it will be his last narcotics prescription I can give him.

## 2016-04-25 NOTE — Telephone Encounter (Signed)
See below

## 2016-04-25 NOTE — Telephone Encounter (Signed)
Patient states hydrocodone upsets his stomach

## 2016-04-25 NOTE — Telephone Encounter (Signed)
Pt stated he was in today and was given hydrocodone and was supposed to get oxycodone

## 2016-04-26 ENCOUNTER — Telehealth (INDEPENDENT_AMBULATORY_CARE_PROVIDER_SITE_OTHER): Payer: Self-pay | Admitting: Orthopaedic Surgery

## 2016-04-26 MED ORDER — OXYCODONE-ACETAMINOPHEN 5-325 MG PO TABS: 1.0000 | ORAL_TABLET | Freq: Two times a day (BID) | ORAL | 0 refills | Status: DC | PRN

## 2016-04-26 NOTE — Telephone Encounter (Signed)
Patient called advised the Hydrocodone is not helping to ease the pain in his right hip. Patient asked if  Dr. Magnus Ivan would prescribe (Oxycodone) for him. Patient said that the oxycodone helped with the pain before. The number to contact patient is [phone]

## 2016-04-26 NOTE — Telephone Encounter (Signed)
Please advise 

## 2016-04-26 NOTE — Telephone Encounter (Signed)
Can come and pick up script 

## 2016-05-15 ENCOUNTER — Other Ambulatory Visit: Payer: Self-pay | Admitting: Internal Medicine

## 2016-05-15 DIAGNOSIS — Z9189 Other specified personal risk factors, not elsewhere classified: Secondary | ICD-10-CM

## 2016-05-15 NOTE — Progress Notes (Unsigned)
We have not seen pt since 8/17. Did he get new pcp? If not, please have him set up f/u appt. On a questionnaire he did on 2/18 scored him high for risk of sleep apnea, so I put him in for sleep study. Please ask him to make when they call him for this. If he does have sleep apnea, a cpap may help his breathing at night. Untreated osa could lead to heart and lung disease, as well as dementia. thanks

## 2016-05-16 NOTE — Progress Notes (Signed)
Appt scheduled for 05/22/16.

## 2016-05-16 NOTE — Progress Notes (Signed)
Could you schedule an appointment  

## 2016-05-22 ENCOUNTER — Encounter: Payer: Self-pay | Admitting: Internal Medicine

## 2016-05-22 ENCOUNTER — Ambulatory Visit: Payer: Medicaid Other | Attending: Internal Medicine | Admitting: Internal Medicine

## 2016-05-22 VITALS — BP 145/92 | HR 85 | Temp 98.3°F | Resp 16 | Wt 313.6 lb

## 2016-05-22 DIAGNOSIS — Z125 Encounter for screening for malignant neoplasm of prostate: Secondary | ICD-10-CM | POA: Diagnosis not present

## 2016-05-22 DIAGNOSIS — M25551 Pain in right hip: Secondary | ICD-10-CM | POA: Insufficient documentation

## 2016-05-22 DIAGNOSIS — Z1159 Encounter for screening for other viral diseases: Secondary | ICD-10-CM | POA: Diagnosis not present

## 2016-05-22 DIAGNOSIS — R7303 Prediabetes: Secondary | ICD-10-CM | POA: Insufficient documentation

## 2016-05-22 DIAGNOSIS — Z23 Encounter for immunization: Secondary | ICD-10-CM | POA: Diagnosis not present

## 2016-05-22 DIAGNOSIS — Z13220 Encounter for screening for lipoid disorders: Secondary | ICD-10-CM

## 2016-05-22 DIAGNOSIS — Z7982 Long term (current) use of aspirin: Secondary | ICD-10-CM | POA: Diagnosis not present

## 2016-05-22 DIAGNOSIS — Z79899 Other long term (current) drug therapy: Secondary | ICD-10-CM | POA: Insufficient documentation

## 2016-05-22 DIAGNOSIS — I10 Essential (primary) hypertension: Secondary | ICD-10-CM | POA: Insufficient documentation

## 2016-05-22 DIAGNOSIS — Z118 Encounter for screening for other infectious and parasitic diseases: Secondary | ICD-10-CM | POA: Insufficient documentation

## 2016-05-22 DIAGNOSIS — K5900 Constipation, unspecified: Secondary | ICD-10-CM | POA: Diagnosis not present

## 2016-05-22 DIAGNOSIS — Z1211 Encounter for screening for malignant neoplasm of colon: Secondary | ICD-10-CM | POA: Diagnosis not present

## 2016-05-22 DIAGNOSIS — R400 Somnolence: Secondary | ICD-10-CM | POA: Insufficient documentation

## 2016-05-22 DIAGNOSIS — Z114 Encounter for screening for human immunodeficiency virus [HIV]: Secondary | ICD-10-CM | POA: Insufficient documentation

## 2016-05-22 LAB — POCT GLYCOSYLATED HEMOGLOBIN (HGB A1C): Hemoglobin A1C: 5.5

## 2016-05-22 MED ORDER — HYDROCHLOROTHIAZIDE 25 MG PO TABS: 25.0000 mg | ORAL_TABLET | Freq: Every day | ORAL | 3 refills | Status: DC

## 2016-05-22 MED FILL — HYDROCHLOROTHIAZIDE 25 MG T: 25 | 30 days supply | Qty: 30 | Fill #0

## 2016-05-22 NOTE — Progress Notes (Signed)
Brandon Finley, is a 56 y.o. male  ZOX:[id number]  [id number]:[id number]  DOB - 1960-06-09  Chief Complaint  Patient presents with  . Referral    Colonscopy and pain mangement         Subjective:   Brandon Finley is a 56 y.o. male here today for a follow up visit, last seen 08/30/15 for htn, morbid obesity. Of note, hx of predm in labs.  Recent  Right THA 03/13/16 and overall doing better.  Patient had 3 wks of PT thereafter.  Still walking w/ cane, slight limp, but pain improved from 10 to 5-6 now. He has been weaned down by his ortho on pain meds, but he requested a referral to pain mgmt.  c/o of daytime somnolence. Increase weight/abd girth, but trying to move more. Of note, had some constipation yesterday, rare for him, that required laxative w/ good results.  He also notes increasing urinary frequency after drinking fluids as well, but denies dysuria.  Denies tob.  Patient has No headache, No chest pain, No abdominal pain - No Nausea, No new weakness tingling or numbness, No Cough - SOB.  No problems updated.  ALLERGIES: Allergies  Allergen Reactions  . No Known Allergies     PAST MEDICAL HISTORY: Past Medical History:  Diagnosis Date  . Arthritis   . Hypertension     MEDICATIONS AT HOME: Prior to Admission medications   Medication Sig Start Date End Date Taking? Authorizing Provider  aspirin 81 MG chewable tablet Chew 1 tablet (81 mg total) by mouth 2 (two) times daily. 03/15/16  Yes Kathryne Hitch, MD  acetaminophen (TYLENOL) 325 MG tablet Take 2 tablets (650 mg total) by mouth every 6 (six) hours as needed. Patient taking differently: Take 650 mg by mouth every 6 (six) hours as needed for mild pain.  08/05/15   Sam, Ace Gins, PA-C  carbamide peroxide (DEBROX) 6.5 % otic solution Place 5 drops into the right ear 2 (two) times daily. Patient taking differently: Place 5 drops into both ears 2 (two) times daily as needed (ear wax).  08/30/15   Pete Glatter, MD    cyclobenzaprine (FLEXERIL) 10 MG tablet Take 1 tablet (10 mg total) by mouth 3 (three) times daily as needed for muscle spasms. Patient not taking: Reported on 05/22/2016 03/15/16   Kathryne Hitch, MD  hydrochlorothiazide (HYDRODIURIL) 25 MG tablet Take 1 tablet (25 mg total) by mouth daily. 05/22/16   Pete Glatter, MD  HYDROcodone-acetaminophen (NORCO/VICODIN) 5-325 MG tablet Take 1-2 tablets by mouth 3 (three) times daily as needed for severe pain. Patient not taking: Reported on 05/22/2016 04/25/16   Kathryne Hitch, MD  naproxen (NAPROSYN) 500 MG tablet Take 1 tablet (500 mg total) by mouth 2 (two) times daily with a meal. Patient not taking: Reported on 05/22/2016 08/30/15   Pete Glatter, MD  oxyCODONE-acetaminophen (PERCOCET) 5-325 MG tablet Take 1-2 tablets by mouth 2 (two) times daily as needed. Patient not taking: Reported on 05/22/2016 04/26/16   Kathryne Hitch, MD  traMADol (ULTRAM) 50 MG tablet Take 1 tablet (50 mg total) by mouth every 6 (six) hours as needed. Patient not taking: Reported on 05/22/2016 11/11/15   Santiago Glad, PA-C     Objective:   Vitals:   05/22/16 0953  BP: (!) 145/92  Pulse: 85  Resp: 16  Temp: 98.3 F (36.8 C)  TempSrc: Oral  SpO2: 95%  Weight: (!) 313 lb 9.6 oz (142.2 kg)  Exam General appearance : Awake, alert, not in any distress. Speech Clear. Not toxic looking, morbid obese, walks w/ slight right sided limp, uses cane. HEENT: Atraumatic and Normocephalic,  Neck: supple, no JVD. No  Goiter. No carotid bruits bilat. Chest:Good air entry bilaterally, no added sounds. CVS: S1 S2 regular, no murmurs/gallups or rubs. Abdomen: Bowel sounds active, 0bese, Non tender and not distended with no gaurding, rigidity or rebound. Extremities: B/L Lower Ext shows no edema, both legs are warm to touch Neurology: Awake alert, and oriented X 3, CN II-XII grossly intact, Non focal Skin:No Rash  Data Review Lab Results  Component  Value Date   HGBA1C 5.8 (H) 12/28/2011    Depression screen PHQ 2/9 09/14/2015 08/30/2015  Decreased Interest 0 0  Down, Depressed, Hopeless 0 0  PHQ - 2 Score 0 0      Assessment & Plan   1. htn - uncontrolled on hctz 12.5 qd Low salt diet encouraged. - increase hctz to 25 qd - fu Eustace Penravia RN 2wks for bp check, if sbp >130, than dc hctz 25 and add prinzide 10-12.5 qd., fu w/ pcp 1 month  2. Hip pain,  right - sp right THA on 2/18, overall doing much better, encouraged increase ambulation - Ambulatory referral to Pain Clinic  3. Daytime somnolence - Split night study; Future  4. Screening cholesterol level - Lipid Panel  5. Need for hepatitis C screening test - Hepatitis C antibody  6. Encounter for screening for HIV - HIV antibody (with reflex)  7. Colon cancer screening - Ambulatory referral to Gastroenterology  8. Prostate cancer screening - PSA, dw pro/cons of psa testing w/ pt, he would like to get it done.  9. Prediabetes - HgB A1c  10. Morbid obesity Recd increase activity, provided pt w/ lc/hf/if diet to help w/ weight loss.  11. tdap today.  Patient have been counseled extensively about nutrition and exercise  Return in about 4 weeks (around 06/19/2016) for urinary frequency.  The patient was given clear instructions to go to ER or return to medical center if symptoms don't improve, worsen or new problems develop. The patient verbalized understanding. The patient was told to call to get lab results if they haven't heard anything in the next week.   This note has been created with Education officer, environmentalDragon speech recognition software and smart phrase technology. Any transcriptional errors are unintentional.   Pete Glatterawn T Stephane Niemann, MD, MBA/MHA [facility] [place] [phone]   05/22/2016, 10:17 AM

## 2016-05-22 NOTE — Patient Instructions (Addendum)
Brandon Barrette RN bp check 2 wks -  Low-Sodium Eating Plan Sodium, which is an element that makes up salt, helps you maintain a healthy balance of fluids in your body. Too much sodium can increase your blood pressure and cause fluid and waste to be held in your body. Your health care provider or dietitian may recommend following this plan if you have high blood pressure (hypertension), kidney disease, liver disease, or heart failure. Eating less sodium can help lower your blood pressure, reduce swelling, and protect your heart, liver, and kidneys. What are tips for following this plan? General guidelines   Most people on this plan should limit their sodium intake to 1,500-2,000 mg (milligrams) of sodium each day. Reading food labels   The Nutrition Facts label lists the amount of sodium in one serving of the food. If you eat more than one serving, you must multiply the listed amount of sodium by the number of servings.  Choose foods with less than 140 mg of sodium per serving.  Avoid foods with 300 mg of sodium or more per serving. Shopping   Look for lower-sodium products, often labeled as "low-sodium" or "no salt added."  Always check the sodium content even if foods are labeled as "unsalted" or "no salt added".  Buy fresh foods.  Avoid canned foods and premade or frozen meals.  Avoid canned, cured, or processed meats  Buy breads that have less than 80 mg of sodium per slice. Cooking   Eat more home-cooked food and less restaurant, buffet, and fast food.  Avoid adding salt when cooking. Use salt-free seasonings or herbs instead of table salt or sea salt. Check with your health care provider or pharmacist before using salt substitutes.  Cook with plant-based oils, such as canola, sunflower, or olive oil. Meal planning   When eating at a restaurant, ask that your food be prepared with less salt or no salt, if possible.  Avoid foods that contain MSG (monosodium glutamate). MSG is  sometimes added to [place] food, bouillon, and some canned foods. What foods are recommended? The items listed may not be a complete list. Talk with your dietitian about what dietary choices are best for you. Grains  Low-sodium cereals, including oats, puffed wheat and rice, and shredded wheat. Low-sodium crackers. Unsalted rice. Unsalted pasta. Low-sodium bread. Whole-grain breads and whole-grain pasta. Vegetables  Fresh or frozen vegetables. "No salt added" canned vegetables. "No salt added" tomato sauce and paste. Low-sodium or reduced-sodium tomato and vegetable juice. Fruits  Fresh, frozen, or canned fruit. Fruit juice. Meats and other protein foods  Fresh or frozen (no salt added) meat, poultry, seafood, and fish. Low-sodium canned tuna and salmon. Unsalted nuts. Dried peas, beans, and lentils without added salt. Unsalted canned beans. Eggs. Unsalted nut butters. Dairy  Milk. Soy milk. Cheese that is naturally low in sodium, such as ricotta cheese, fresh mozzarella, or Swiss cheese Low-sodium or reduced-sodium cheese. Cream cheese. Yogurt. Fats and oils  Unsalted butter. Unsalted margarine with no trans fat. Vegetable oils such as canola or olive oils. Seasonings and other foods  Fresh and dried herbs and spices. Salt-free seasonings. Low-sodium mustard and ketchup. Sodium-free salad dressing. Sodium-free light mayonnaise. Fresh or refrigerated horseradish. Lemon juice. Vinegar. Homemade, reduced-sodium, or low-sodium soups. Unsalted popcorn and pretzels. Low-salt or salt-free chips. What foods are not recommended? The items listed may not be a complete list. Talk with your dietitian about what dietary choices are best for you. Grains  Instant hot cereals. Bread stuffing,  pancake, and biscuit mixes. Croutons. Seasoned rice or pasta mixes. Noodle soup cups. Boxed or frozen macaroni and cheese. Regular salted crackers. Self-rising flour. Vegetables  Sauerkraut, pickled vegetables, and  relishes. Olives. [place] fries. Onion rings. Regular canned vegetables (not low-sodium or reduced-sodium). Regular canned tomato sauce and paste (not low-sodium or reduced-sodium). Regular tomato and vegetable juice (not low-sodium or reduced-sodium). Frozen vegetables in sauces. Meats and other protein foods  Meat or fish that is salted, canned, smoked, spiced, or pickled. Bacon, ham, sausage, hotdogs, corned beef, chipped beef, packaged lunch meats, salt pork, jerky, pickled herring, anchovies, regular canned tuna, sardines, salted nuts. Dairy  Processed cheese and cheese spreads. Cheese curds. Blue cheese. Feta cheese. String cheese. Regular cottage cheese. Buttermilk. Canned milk. Fats and oils  Salted butter. Regular margarine. Ghee. Bacon fat. Seasonings and other foods  Onion salt, garlic salt, seasoned salt, table salt, and sea salt. Canned and packaged gravies. Worcestershire sauce. Tartar sauce. Barbecue sauce. Teriyaki sauce. Soy sauce, including reduced-sodium. Steak sauce. Fish sauce. Oyster sauce. Cocktail sauce. Horseradish that you find on the shelf. Regular ketchup and mustard. Meat flavorings and tenderizers. Bouillon cubes. Hot sauce and Tabasco sauce. Premade or packaged marinades. Premade or packaged taco seasonings. Relishes. Regular salad dressings. Salsa. Potato and tortilla chips. Corn chips and puffs. Salted popcorn and pretzels. Canned or dried soups. Pizza. Frozen entrees and pot pies. Summary  Eating less sodium can help lower your blood pressure, reduce swelling, and protect your heart, liver, and kidneys.  Most people on this plan should limit their sodium intake to 1,500-2,000 mg (milligrams) of sodium each day.  Canned, boxed, and frozen foods are high in sodium. Restaurant foods, fast foods, and pizza are also very high in sodium. You also get sodium by adding salt to food.  Try to cook at home, eat more fresh fruits and vegetables, and eat less fast food, canned,  processed, or prepared foods. This information is not intended to replace advice given to you by your health care provider. Make sure you discuss any questions you have with your health care provider. Document Released: 06/23/2001 Document Revised: 12/26/2015 Document Reviewed: 12/26/2015 Elsevier Interactive Patient Education  2017 Elsevier Inc.   Heart-Healthy Eating Plan Heart-healthy meal planning includes:  Limiting unhealthy fats.  Increasing healthy fats.  Making other small dietary changes. You may need to talk with your doctor or a diet specialist (dietitian) to create an eating plan that is right for you. What types of fat should I choose?  Choose healthy fats. These include olive oil and canola oil, flaxseeds, walnuts, almonds, and seeds.  Eat more omega-3 fats. These include salmon, mackerel, sardines, tuna, flaxseed oil, and ground flaxseeds. Try to eat fish at least twice each week.  Limit saturated fats.  Saturated fats are often found in animal products, such as meats, butter, and cream.  Plant sources of saturated fats include palm oil, palm kernel oil, and coconut oil.  Avoid foods with partially hydrogenated oils in them. These include stick margarine, some tub margarines, cookies, crackers, and other baked goods. These contain trans fats. What general guidelines do I need to follow?  Check food labels carefully. Identify foods with trans fats or high amounts of saturated fat.  Fill one half of your plate with vegetables and green salads. Eat 4-5 servings of vegetables per day. A serving of vegetables is:  1 cup of raw leafy vegetables.   cup of raw or cooked cut-up vegetables.   cup of vegetable juice.  Fill one fourth of your plate with whole grains. Look for the word "whole" as the first word in the ingredient list.  Fill one fourth of your plate with lean protein foods.  Eat 4-5 servings of fruit per day. A serving of fruit is:  One medium whole  fruit.   cup of dried fruit.   cup of fresh, frozen, or canned fruit.   cup of 100% fruit juice.  Eat more foods that contain soluble fiber. These include apples, broccoli, carrots, beans, peas, and barley. Try to get 20-30 g of fiber per day.  Eat more home-cooked food. Eat less restaurant, buffet, and fast food.  Limit or avoid alcohol.  Limit foods high in starch and sugar.  Avoid fried foods.  Avoid frying your food. Try baking, boiling, grilling, or broiling it instead. You can also reduce fat by:  Removing the skin from poultry.  Removing all visible fats from meats.  Skimming the fat off of stews, soups, and gravies before serving them.  Steaming vegetables in water or broth.  Lose weight if you are overweight.  Eat 4-5 servings of nuts, legumes, and seeds per week:  One serving of dried beans or legumes equals  cup after being cooked.  One serving of nuts equals 1 ounces.  One serving of seeds equals  ounce or one tablespoon.  You may need to keep track of how much salt or sodium you eat. This is especially true if you have high blood pressure. Talk with your doctor or dietitian to get more information. What foods can I eat? Grains  Breads, including [place], white, pita, wheat, raisin, rye, oatmeal, and [place]. Tortillas that are neither fried nor made with lard or trans fat. Low-fat rolls, including hotdog and hamburger buns and English muffins. Biscuits. Muffins. Waffles. Pancakes. Light popcorn. Whole-grain cereals. Flatbread. Melba toast. Pretzels. Breadsticks. Rusks. Low-fat snacks. Low-fat crackers, including oyster, saltine, matzo, graham, animal, and rye. Rice and pasta, including brown rice and pastas that are made with whole wheat. Vegetables  All vegetables. Fruits  All fruits, but limit coconut. Meats and Other Protein Sources  Lean, well-trimmed beef, veal, pork, and lamb. Chicken and [place] without skin. All fish and shellfish. Wild duck,  rabbit, pheasant, and venison. Egg whites or low-cholesterol egg substitutes. Dried beans, peas, lentils, and tofu. Seeds and most nuts. Dairy  Low-fat or nonfat cheeses, including ricotta, string, and mozzarella. Skim or 1% milk that is liquid, powdered, or evaporated. Buttermilk that is made with low-fat milk. Nonfat or low-fat yogurt. Beverages  Mineral water. Diet carbonated beverages. Sweets and Desserts  Sherbets and fruit ices. Honey, jam, marmalade, jelly, and syrups. Meringues and gelatins. Pure sugar candy, such as hard candy, jelly beans, gumdrops, mints, marshmallows, and small amounts of dark chocolate. [employer]. Eat all sweets and desserts in moderation. Fats and Oils  Nonhydrogenated (trans-free) margarines. Vegetable oils, including soybean, sesame, sunflower, olive, peanut, safflower, corn, canola, and cottonseed. Salad dressings or mayonnaise made with a vegetable oil. Limit added fats and oils that you use for cooking, baking, salads, and as spreads. Other  Cocoa powder. Coffee and tea. All seasonings and condiments. The items listed above may not be a complete list of recommended foods or beverages. Contact your dietitian for more options.  What foods are not recommended? Grains  Breads that are made with saturated or trans fats, oils, or whole milk. Croissants. Butter rolls. Cheese breads. Sweet rolls. Donuts. Buttered popcorn. Chow mein noodles. High-fat crackers, such  as cheese or butter crackers. Meats and Other Protein Sources  Fatty meats, such as hotdogs, short ribs, sausage, spareribs, bacon, rib eye roast or steak, and mutton. High-fat deli meats, such as salami and bologna. Caviar. Domestic duck and goose. Organ meats, such as kidney, liver, sweetbreads, and heart. Dairy  Cream, sour cream, cream cheese, and creamed cottage cheese. Whole-milk cheeses, including blue (bleu), [address], [place], [place], [address], [place], [address], cheddar, [place], and [place].  Whole or 2% milk that is liquid, evaporated, or condensed. Whole buttermilk. Cream sauce or high-fat cheese sauce. Yogurt that is made from whole milk. Beverages  Regular sodas and juice drinks with added sugar. Sweets and Desserts  Frosting. Pudding. Cookies. Cakes other than angel food cake. Candy that has milk chocolate or white chocolate, hydrogenated fat, butter, coconut, or unknown ingredients. Buttered syrups. Full-fat ice cream or ice cream drinks. Fats and Oils  Gravy that has suet, meat fat, or shortening. Cocoa butter, hydrogenated oils, palm oil, coconut oil, palm kernel oil. These can often be found in baked products, candy, fried foods, nondairy creamers, and whipped toppings. Solid fats and shortenings, including bacon fat, salt pork, lard, and butter. Nondairy cream substitutes, such as coffee creamers and sour cream substitutes. Salad dressings that are made of unknown oils, cheese, or sour cream. The items listed above may not be a complete list of foods and beverages to avoid. Contact your dietitian for more information.  This information is not intended to replace advice given to you by your health care provider. Make sure you discuss any questions you have with your health care provider. Document Released: 07/03/2011 Document Revised: 06/09/2015 Document Reviewed: 06/25/2013 Elsevier Interactive Patient Education  2017 Elsevier Inc.  -  QUICK START PATIENT GUIDE TO LCHF/IF - weight loss LOW CARB HIGH FAT / INTERMITTENT FASTING  Recommend: <50 gram carbohydrate a day for weightloss.  What is this diet and how does it work? o Insulin is a hormone made by your body that allows you to use sugar (glucose) from carbohydrates in the food you eat for energy or to store glucose (as fat) for future use  o Insulin levels need to be lowered in order to utilize our stored energy (fat) o Many struggling with obesity are insulin resistant and have high levels of insulin o This diet  works to lower your insulin in two ways o Fasting - allows your insulin levels to naturally decrease  o Avoiding carbohydrates - carbs trigger increase in insulin Low Carb Healthy Fat (LCHF) o Get a free app for your phone, such as MyFitnessPal, to help you track your macronutrients (carbs/protein/fats) and to track your weight and body measurements to see your progress o Set your goal for around 10% carbs/20% protein/70% fat o A good starting goal for amount of net carbs per day is 50 grams (some will aim for 20 grams) o "Net carbs" refers to total grams of carbs minus grams of fiber (as fiber is not typically absorbed). For example, if a food has 5g total carb and 3g fiber, that would be 2g net carbs o Increase healthy fats - eg. olive oil, eggs, nuts, avocado, cheese, butter, coconut, meats, fish o Avoid high carb foods - eg. bread, pasta, potatoes, rice, cookies, soda, juice, anything sugary o Buy full-fat ingredients (avoid low-fat versions, which often have more sugar) o No need to count calories, but pay close attention to grams of carbs on labels Intermittent Fasting (IF) o "Fasting" is going a period of  time without eating - it helps to stay busy and well-hydrated o Purpose of fasting is to allow insulin levels to drop as low as possible, allowing your body to switch into fat-burning mode o With this diet there are many approaches to fasting, but 16:8 and 24hr fasts are commonly used o 16:8 fast, usually 5-7 days a week - Fasting for 16 hours of the day, then eating all meals for the day over course of 8 hours. o 24 hour fast, usually 1-3 days a week - Typically eating one meal a day, then fasting until the next day. Plenty of fluids (and some salt to help you hold onto fluids) are recommended during longer fasts.  o During fasts certain beverages are still acceptable - water, sparkling water, bone broth, black tea or coffee, or tea/coffee with small amount of heavy whipping cream Special  note for those on diabetic medications o Discuss your medications with your physician. You may need to hold your medication or adjust to only taking when eating. Diabetics should keep close track of their blood sugars when making any changes to diet/meds, to ensure they are staying within normal limits For more info about LCHF/IF o Watch "Therapeutic Fasting - Solving the Two-Compartment Problem" video by Dr. Wylene Simmer on YouTube ([email or web address]) for a great intro to these concepts o Read "The Obesity Code" and/or "The Complete Guide to Fasting" by Dr. Wylene Simmer o Go to www.dietdoctor.com for explanations, recipes, and infographics about foods to eat/avoid o Get a Free smartphone app that helps count carbohydrates  - ie MyKeto EXAMPLES TO GET STARTED Fasting Beverages -water (can add  tsp Pink Himalayan salt once or twice a day to help stay hydrated for longer fasts) -Sparkling water (such as [employer] or similar; avoid any with artificial sweeteners)  -Bone broth (multiple recipes available online or can buy pre-made) -Tea or Coffee (Adding heavy whipping cream or coconut oil to your tea or coffee can be helpful if you find yourself getting too hungry during the fasts. Can also add cinnamon for flavor. Or "bulletproof coffee.") Low Carb Healthy Fat Breakfast (if not fasting) -eggs in butter or olive oil with avocado -omelet with veggies and cheese  Lunch -hamburger with cheese and avocado wrapped in lettuce (no bun, no ketchup) -meat and cheese wrapped in lettuce (can dip in mustard or olive oil/vinegar/mayo) -salad with meat/cheese/nuts and higher fat dressing (vinaigrette or Ranch, etc) -tuna salad lettuce wrap -taco meat with cheese, sour cream, guacamole, cheese over lettuce  Dinner -steak with herb butter or [place] sauce -"Fathead" pizza (uses cheese and almond flour for the dough - several recipes available online) -roasted or grilled chicken with  skin on, with low carb sauce (buffalo, garlic butter, alfredo, pesto, etc) -baked salmon with lemon butter -chicken alfredo with zucchini noodles -[place] butter chicken with low carb garlic naan -egg roll in a bowl  Side Dishes -mashed cauliflower (homemade or available in freezer section) -roast vegetables (green veggies that grow above ground rather than root veggies) with butter or cheese -Caprese salad (fresh mozzarella, tomato and basil with olive oil) -homemade low-carb coleslaw Snacks/Desserts (try to avoid unnecessary snacking and sweets in general) -celery or cucumber dipped in guacamole or sour cream dip -cheese and meat slices  -raspberries with whipped cream (can make homemade with no sugar added) -low carb [place] butter cake  AVOID - sugar, diet/regular soda, potatoes, breads, rice, pasta, candy, cookies, cakes, muffins, juice, high carb fruit (bananas, grapes), beer, ketchup, barbeque and  other sweet sauces Td Vaccine (Tetanus and Diphtheria): What You Need to Know 1. Why get vaccinated? Tetanus  and diphtheria are very serious diseases. They are rare in the [place] today, but people who do become infected often have severe complications. Td vaccine is used to protect adolescents and adults from both of these diseases. Both tetanus and diphtheria are infections caused by bacteria. Diphtheria spreads from person to person through coughing or sneezing. Tetanus-causing bacteria enter the body through cuts, scratches, or wounds. TETANUS (lockjaw) causes painful muscle tightening and stiffness, usually all over the body.  It can lead to tightening of muscles in the head and neck so you can't open your mouth, swallow, or sometimes even breathe. Tetanus kills about 1 out of every 10 people who are infected even after receiving the best medical care. DIPHTHERIA can cause a thick coating to form in the back of the throat.  It can lead to breathing problems, paralysis, heart  failure, and death. Before vaccines, as many as 200,000 cases of diphtheria and hundreds of cases of tetanus were reported in the [place] each year. Since vaccination began, reports of cases for both diseases have dropped by about 99%. 2. Td vaccine Td vaccine can protect adolescents and adults from tetanus and diphtheria. Td is usually given as a booster dose every 10 years but it can also be given earlier after a severe and dirty wound or burn. Another vaccine, called Tdap, which protects against pertussis in addition to tetanus and diphtheria, is sometimes recommended instead of Td vaccine. Your doctor or the person giving you the vaccine can give you more information. Td may safely be given at the same time as other vaccines. 3. Some people should not get this vaccine  A person who has ever had a life-threatening allergic reaction after a previous dose of any tetanus or diphtheria containing vaccine, OR has a severe allergy to any part of this vaccine, should not get Td vaccine. Tell the person giving the vaccine about any severe allergies.  Talk to your doctor if you:  had severe pain or swelling after any vaccine containing diphtheria or tetanus,  ever had a condition called Guillain Barre Syndrome (GBS),  aren't feeling well on the day the shot is scheduled. 4. What are the risks from Td vaccine? With any medicine, including vaccines, there is a chance of side effects. These are usually mild and go away on their own. Serious reactions are also possible but are rare. Most people who get Td vaccine do not have any problems with it. Mild problems following Td vaccine:  (Did not interfere with activities)  Pain where the shot was given (about 8 people in 10)  Redness or swelling where the shot was given (about 1 person in 4)  Mild fever (rare)  Headache (about 1 person in 4)  Tiredness (about 1 person in 4) Moderate problems following Td vaccine:  (Interfered with  activities, but did not require medical attention)  Fever over 102F (rare) Severe problems following Td vaccine:  (Unable to perform usual activities; required medical attention)  Swelling, severe pain, bleeding and/or redness in the arm where the shot was given (rare). Problems that could happen after any vaccine:   People sometimes faint after a medical procedure, including vaccination. Sitting or lying down for about 15 minutes can help prevent fainting, and injuries caused by a fall. Tell your doctor if you feel dizzy, or have vision changes or ringing in the ears.  Some people get severe pain in the shoulder and have difficulty moving the arm where a shot was given. This happens very rarely.  Any medication can cause a severe allergic reaction. Such reactions from a vaccine are very rare, estimated at fewer than 1 in a million doses, and would happen within a few minutes to a few hours after the vaccination. As with any medicine, there is a very remote chance of a vaccine causing a serious injury or death. The safety of vaccines is always being monitored. For more information, visit: http://floyd.org/www.cdc.gov/vaccinesafety/ 5. What if there is a serious reaction? What should I look for?  Look for anything that concerns you, such as signs of a severe allergic reaction, very high fever, or unusual behavior. Signs of a severe allergic reaction can include hives, swelling of the face and throat, difficulty breathing, a fast heartbeat, dizziness, and weakness. These would usually start a few minutes to a few hours after the vaccination. What should I do?   If you think it is a severe allergic reaction or other emergency that can't wait, call 9-1-1 or get the person to the nearest hospital. Otherwise, call your doctor.  Afterward, the reaction should be reported to the Vaccine Adverse Event Reporting System (VAERS). Your doctor might file this report, or you can do it yourself through the VAERS web site at  www.vaers.LAgents.nohhs.gov, or by calling 1-(276) 114-0958.  VAERS does not give medical advice. 6. The National Vaccine Injury Compensation Program The [employer] Vaccine Injury Compensation Program (VICP) is a federal program that was created to compensate people who may have been injured by certain vaccines. Persons who believe they may have been injured by a vaccine can learn about the program and about filing a claim by calling 1-971-574-3672 or visiting the VICP website at SpiritualWord.atwww.hrsa.gov/vaccinecompensation. There is a time limit to file a claim for compensation. 7. How can I learn more?  Ask your doctor. He or she can give you the vaccine package insert or suggest other sources of information.  Call your local or state health department.  Contact the Centers for Disease Control and Prevention (CDC):  Call [phone]) [phone] (1-800-CDC-INFO)  Visit CDC's website at PicCapture.uywww.cdc.gov/vaccines CDC Td Vaccine VIS (04/26/15) This information is not intended to replace advice given to you by your health care provider. Make sure you discuss any questions you have with your health care provider. Document Released: 10/29/2005 Document Revised: 09/22/2015 Document Reviewed: 09/22/2015 Elsevier Interactive Patient Education  2017 [employer]

## 2016-05-23 ENCOUNTER — Other Ambulatory Visit: Payer: Self-pay | Admitting: Internal Medicine

## 2016-05-23 ENCOUNTER — Encounter (INDEPENDENT_AMBULATORY_CARE_PROVIDER_SITE_OTHER): Payer: Self-pay | Admitting: Orthopaedic Surgery

## 2016-05-23 ENCOUNTER — Ambulatory Visit (INDEPENDENT_AMBULATORY_CARE_PROVIDER_SITE_OTHER): Payer: Self-pay | Admitting: Orthopaedic Surgery

## 2016-05-23 VITALS — Ht 73.0 in | Wt 313.0 lb

## 2016-05-23 DIAGNOSIS — Z96641 Presence of right artificial hip joint: Secondary | ICD-10-CM

## 2016-05-23 LAB — HEPATITIS C ANTIBODY

## 2016-05-23 LAB — PSA: Prostate Specific Ag, Serum: 0.8 ng/mL (ref 0.0–4.0)

## 2016-05-23 LAB — LIPID PANEL
CHOLESTEROL TOTAL: 192 mg/dL (ref 100–199)
Chol/HDL Ratio: 5.8 ratio — ABNORMAL HIGH (ref 0.0–5.0)
HDL: 33 mg/dL — AB (ref >39)
LDL Calculated: 116 mg/dL — ABNORMAL HIGH (ref 0–99)
TRIGLYCERIDES: 213 mg/dL — AB (ref 0–149)
VLDL CHOLESTEROL CAL: 43 mg/dL — AB (ref 5–40)

## 2016-05-23 LAB — HIV ANTIBODY (ROUTINE TESTING W REFLEX): HIV Screen 4th Generation wRfx: NONREACTIVE

## 2016-05-23 MED ORDER — PRAVASTATIN SODIUM 20 MG PO TABS
20.0000 mg | ORAL_TABLET | Freq: Every day | ORAL | 3 refills | Status: DC
Start: 2016-05-23 — End: 2016-06-22

## 2016-05-23 NOTE — Progress Notes (Signed)
The patient is now about 9-10 weeks status post a right total hip arthroplasty. He is someone is morbidly obese. He is made a turn for the better at this point. He is walking without assistive device. He does have a slight limp but overall he feels like he can go back to his job at the [employer] by June 1.  I can put his hip through range of motion on the right side easily. His leg lengths are equal. His incisions well-healed.  At this point I do not see him back until 6 months from now. At that visit I would like a low AP pelvis lateral of his right operative hip.

## 2016-05-25 ENCOUNTER — Telehealth: Payer: Self-pay

## 2016-05-25 NOTE — Telephone Encounter (Signed)
Contacted pt to go over lab results pt didn't answer lvm asking pt to give me a call at his earliest convenience  

## 2016-05-28 ENCOUNTER — Ambulatory Visit: Payer: Self-pay

## 2016-05-28 ENCOUNTER — Encounter: Payer: Self-pay | Admitting: Gastroenterology

## 2016-05-31 ENCOUNTER — Encounter: Payer: Self-pay | Admitting: Internal Medicine

## 2016-06-01 ENCOUNTER — Ambulatory Visit: Payer: Self-pay | Attending: Internal Medicine

## 2016-06-01 ENCOUNTER — Encounter: Payer: Self-pay | Admitting: Internal Medicine

## 2016-06-04 ENCOUNTER — Encounter: Payer: Self-pay | Admitting: Internal Medicine

## 2016-06-05 ENCOUNTER — Telehealth (INDEPENDENT_AMBULATORY_CARE_PROVIDER_SITE_OTHER): Payer: Self-pay

## 2016-06-05 NOTE — Telephone Encounter (Signed)
Janice-PAC would like to speak with you about patient. She is calling from pain management [phone]

## 2016-06-06 MED FILL — oxyCODONE HCL 5 MG TABS: 5 | 15 days supply | Qty: 30 | Fill #0

## 2016-06-15 NOTE — Addendum Note (Signed)
Addendum  created 06/15/16 1205 by Lucita Montoya, MD   Sign clinical note    

## 2016-06-18 ENCOUNTER — Encounter: Payer: Self-pay | Admitting: Physical Medicine & Rehabilitation

## 2016-06-20 MED FILL — oxyCODONE HCL 5 MG TABS: 5 | 30 days supply | Qty: 60 | Fill #0

## 2016-06-22 ENCOUNTER — Ambulatory Visit: Payer: Medicaid Other | Attending: Family Medicine | Admitting: Family Medicine

## 2016-06-22 ENCOUNTER — Encounter: Payer: Self-pay | Admitting: Family Medicine

## 2016-06-22 VITALS — BP 136/84 | HR 65 | Temp 97.9°F | Resp 18 | Ht 73.0 in | Wt 320.0 lb

## 2016-06-22 DIAGNOSIS — I10 Essential (primary) hypertension: Secondary | ICD-10-CM | POA: Diagnosis not present

## 2016-06-22 DIAGNOSIS — Z96641 Presence of right artificial hip joint: Secondary | ICD-10-CM

## 2016-06-22 DIAGNOSIS — E8881 Metabolic syndrome: Secondary | ICD-10-CM | POA: Insufficient documentation

## 2016-06-22 DIAGNOSIS — M25551 Pain in right hip: Secondary | ICD-10-CM | POA: Diagnosis not present

## 2016-06-22 DIAGNOSIS — Z7982 Long term (current) use of aspirin: Secondary | ICD-10-CM | POA: Diagnosis not present

## 2016-06-22 DIAGNOSIS — Z79899 Other long term (current) drug therapy: Secondary | ICD-10-CM | POA: Diagnosis not present

## 2016-06-22 MED ORDER — ASPIRIN EC 81 MG PO TBEC: 81.0000 mg | DELAYED_RELEASE_TABLET | Freq: Every day | ORAL | 3 refills | Status: DC

## 2016-06-22 MED ORDER — PRAVASTATIN SODIUM 20 MG PO TABS: 20.0000 mg | ORAL_TABLET | Freq: Every day | ORAL | 1 refills | Status: DC

## 2016-06-22 MED ORDER — HYDROCHLOROTHIAZIDE 25 MG PO TABS: 25.0000 mg | ORAL_TABLET | Freq: Every day | ORAL | 1 refills | Status: DC

## 2016-06-22 MED FILL — PRAVASTATIN NA 20 MG TAB: 20 | 30 days supply | Qty: 30 | Fill #0

## 2016-06-22 MED FILL — HYDROCHLOROTHIAZIDE 25 MG T: 25 | 30 days supply | Qty: 30 | Fill #0

## 2016-06-22 NOTE — Patient Instructions (Signed)

## 2016-06-22 NOTE — Progress Notes (Signed)
Subjective:  Patient ID: Brandon Finley, male    DOB: April 19, 1960  Age: 56 y.o. MRN: [id number]  CC: Follow-up visit  HPI Brandon Finley is a 56 year old male with a history of hypertension, metabolic syndrome, obesity, right total hip arthroplasty in 02/2016 who presents to the clinic today to establish care with me as he was previously followed by Dr Julien NordmannLangeland who is no longer with the practice.  He completed completed a session of physical therapy but does have some residual pain in his right hip with walking; last visit to his orthopedic-Dr. Magnus IvanBlackman was 1 month ago. He endorses compliance with all his medications.  Currently thinking of losing weight and is working towards being more active and reducing his caloric intake. He has no acute concerns at this time.  Past Medical History:  Diagnosis Date  . Arthritis   . Hypertension     Allergies  Allergen Reactions  . No Known Allergies     No current outpatient prescriptions on file prior to visit.   No current facility-administered medications on file prior to visit.      Outpatient Medications Prior to Visit  Medication Sig Dispense Refill  . acetaminophen (TYLENOL) 325 MG tablet Take 2 tablets (650 mg total) by mouth every 6 (six) hours as needed. (Patient taking differently: Take 650 mg by mouth every 6 (six) hours as needed for mild pain. ) 30 tablet 0  . aspirin 81 MG chewable tablet Chew 1 tablet (81 mg total) by mouth 2 (two) times daily. 60 tablet 0  . pravastatin (PRAVACHOL) 20 MG tablet Take 1 tablet (20 mg total) by mouth daily. 90 tablet 3  . carbamide peroxide (DEBROX) 6.5 % otic solution Place 5 drops into the right ear 2 (two) times daily. (Patient not taking: Reported on 06/22/2016) 15 mL 0  . hydrochlorothiazide (HYDRODIURIL) 25 MG tablet Take 1 tablet (25 mg total) by mouth daily. (Patient not taking: Reported on 06/22/2016) 90 tablet 3   No facility-administered medications prior to visit.      ROS Review of Systems  Constitutional: Negative for activity change and appetite change.  HENT: Negative for sinus pressure and sore throat.   Eyes: Negative for visual disturbance.  Respiratory: Negative for cough, chest tightness and shortness of breath.   Cardiovascular: Negative for chest pain and leg swelling.  Gastrointestinal: Negative for abdominal distention, abdominal pain, constipation and diarrhea.  Endocrine: Negative.   Genitourinary: Negative for dysuria.  Musculoskeletal:       See hpi  Skin: Negative for rash.  Allergic/Immunologic: Negative.   Neurological: Negative for weakness, light-headedness and numbness.  Psychiatric/Behavioral: Negative for dysphoric mood and suicidal ideas.    Objective:  BP 136/84 (BP Location: Right Arm, Patient Position: Sitting, Cuff Size: Large)   Pulse 65   Temp 97.9 F (36.6 C) (Oral)   Resp 18   Ht 6\' 1"  (1.854 m)   Wt (!) 320 lb (145.2 kg)   SpO2 95%   BMI 42.22 kg/m   BP/Weight 06/22/2016 05/23/2016 05/22/2016  Systolic BP 136 - 145  Diastolic BP 84 - 92  Wt. (Lbs) 320 313 313.6  BMI 42.22 41.3 41.37      Physical Exam  Constitutional: He is oriented to person, place, and time. He appears well-developed and well-nourished.  Cardiovascular: Normal rate, normal heart sounds and intact distal pulses.   No murmur heard. Pulmonary/Chest: Effort normal and breath sounds normal. He has no wheezes. He has no rales.  He exhibits no tenderness.  Abdominal: Soft. Bowel sounds are normal. He exhibits no distension and no mass. There is no tenderness.  Musculoskeletal:  Slight tenderness on ROM of R hip  Neurological: He is alert and oriented to person, place, and time.  Skin: Skin is warm and dry.  Psychiatric: He has a normal mood and affect.     Assessment & Plan:   1. Metabolic syndrome - pravastatin (PRAVACHOL) 20 MG tablet; Take 1 tablet (20 mg total) by mouth daily.  Dispense: 90 tablet; Refill: 1 - aspirin EC 81  MG tablet; Take 1 tablet (81 mg total) by mouth daily.  Dispense: 30 tablet; Refill: 3  2. Status post total replacement of right hip Does have some residual pain on range of motion Completed a course of physical therapy Advised to maintain active state  3. Essential hypertension Controlled - hydrochlorothiazide (HYDRODIURIL) 25 MG tablet; Take 1 tablet (25 mg total) by mouth daily.  Dispense: 90 tablet; Refill: 1 - Basic Metabolic Panel  4. Morbid obesity (HCC)  discussed reducing portion sizes Exercise for 30 minutes on 5 days of the week  Meds ordered this encounter  Medications  . hydrochlorothiazide (HYDRODIURIL) 25 MG tablet    Sig: Take 1 tablet (25 mg total) by mouth daily.    Dispense:  90 tablet    Refill:  1  . pravastatin (PRAVACHOL) 20 MG tablet    Sig: Take 1 tablet (20 mg total) by mouth daily.    Dispense:  90 tablet    Refill:  1  . aspirin EC 81 MG tablet    Sig: Take 1 tablet (81 mg total) by mouth daily.    Dispense:  30 tablet    Refill:  3    Follow-up: Return in about 3 months (around 09/22/2016) for Up on chronic medical conditions.   Jaclyn Shaggy MD

## 2016-06-22 NOTE — Progress Notes (Signed)
F/U UTI  No pain today  No suicidal thought in the past two weeks  No tobacco user

## 2016-06-23 LAB — BASIC METABOLIC PANEL
BUN / CREAT RATIO: 12 (ref 9–20)
BUN: 9 mg/dL (ref 6–24)
CO2: 25 mmol/L (ref 18–29)
CREATININE: 0.74 mg/dL — AB (ref 0.76–1.27)
Calcium: 9.8 mg/dL (ref 8.7–10.2)
Chloride: 103 mmol/L (ref 96–106)
GFR calc Af Amer: 120 mL/min/{1.73_m2} (ref >59)
GFR, EST NON AFRICAN AMERICAN: 104 mL/min/{1.73_m2} (ref >59)
Glucose: 118 mg/dL — ABNORMAL HIGH (ref 65–99)
Potassium: 4.5 mmol/L (ref 3.5–5.2)
SODIUM: 142 mmol/L (ref 134–144)

## 2016-07-13 ENCOUNTER — Telehealth: Payer: Self-pay

## 2016-07-13 NOTE — Telephone Encounter (Signed)
Patient No showed for Pre-Visit appointment that was scheduled for 07/13/16. Spoke with patient and he told me that he would call  Back today and reschedule his appointment for Pre-Visit after he looked at his schedule. Patient never called back. Patient was called again to reschedule, No answer, left message that colonoscopy would be cancelled if he did not call back to reschedule today by 5:00 Pm. Patient did not call to reschedule so procedure was cancelled. A no show letter was sent.    Janalee DaneNancy Kamilla Hands, LPN

## 2016-07-26 ENCOUNTER — Encounter: Payer: Medicaid Other | Attending: Physical Medicine & Rehabilitation

## 2016-07-26 ENCOUNTER — Ambulatory Visit: Payer: Medicaid Other | Admitting: Physical Medicine & Rehabilitation

## 2016-07-27 ENCOUNTER — Encounter: Payer: Self-pay | Admitting: Gastroenterology

## 2016-07-30 MED FILL — oxyCODONE HCL 5 MG TABS: 5 | 30 days supply | Qty: 60 | Fill #0

## 2016-08-08 ENCOUNTER — Encounter (HOSPITAL_BASED_OUTPATIENT_CLINIC_OR_DEPARTMENT_OTHER): Payer: Medicaid Other

## 2016-08-28 MED FILL — oxyCODONE HCL 5 MG TABS: 5 | 30 days supply | Qty: 60 | Fill #0

## 2016-09-25 ENCOUNTER — Ambulatory Visit: Payer: Medicaid Other | Admitting: Family Medicine

## 2016-09-27 MED FILL — oxyCODONE HCL 5 MG TABS: 5 | 30 days supply | Qty: 60 | Fill #0

## 2016-10-02 ENCOUNTER — Ambulatory Visit: Payer: Medicaid Other | Admitting: Family Medicine

## 2016-10-05 ENCOUNTER — Encounter (HOSPITAL_BASED_OUTPATIENT_CLINIC_OR_DEPARTMENT_OTHER): Payer: Medicaid Other

## 2016-10-16 ENCOUNTER — Ambulatory Visit: Payer: Medicaid Other | Admitting: Family Medicine

## 2016-10-18 ENCOUNTER — Ambulatory Visit (HOSPITAL_BASED_OUTPATIENT_CLINIC_OR_DEPARTMENT_OTHER): Payer: Medicaid Other

## 2016-10-26 MED FILL — oxyCODONE HCL 5 MG TABS: 5 | 30 days supply | Qty: 60 | Fill #0

## 2016-10-31 ENCOUNTER — Telehealth (INDEPENDENT_AMBULATORY_CARE_PROVIDER_SITE_OTHER): Payer: Self-pay | Admitting: Orthopaedic Surgery

## 2016-10-31 NOTE — Telephone Encounter (Signed)
Patient states he lost the handicap paperwork that was signed by Dr Magnus IvanBlackman and he is requesting a new form. Please call when ready for pick up

## 2016-10-31 NOTE — Telephone Encounter (Signed)
On your desk in clinic

## 2016-11-22 ENCOUNTER — Ambulatory Visit (INDEPENDENT_AMBULATORY_CARE_PROVIDER_SITE_OTHER): Payer: Self-pay | Admitting: Orthopaedic Surgery

## 2016-11-27 ENCOUNTER — Ambulatory Visit: Payer: Medicaid Other | Admitting: Family Medicine

## 2016-12-12 ENCOUNTER — Ambulatory Visit (HOSPITAL_BASED_OUTPATIENT_CLINIC_OR_DEPARTMENT_OTHER): Payer: Medicaid Other

## 2016-12-12 MED FILL — oxyCODONE HCL 5 MG TABS: 5 | 30 days supply | Qty: 60 | Fill #0

## 2017-01-11 MED FILL — oxyCODONE HCL 5 MG TABS: 5 | 30 days supply | Qty: 60 | Fill #0

## 2017-01-16 ENCOUNTER — Emergency Department (HOSPITAL_COMMUNITY)
Admission: EM | Admit: 2017-01-16 | Discharge: 2017-01-16 | Disposition: A | Discharge status: Home / Self Care | Payer: Medicaid Other | Attending: Emergency Medicine | Admitting: Emergency Medicine

## 2017-01-16 ENCOUNTER — Encounter (HOSPITAL_COMMUNITY): Payer: Self-pay

## 2017-01-16 DIAGNOSIS — Z041 Encounter for examination and observation following transport accident: Secondary | ICD-10-CM | POA: Diagnosis not present

## 2017-01-16 DIAGNOSIS — M545 Low back pain: Secondary | ICD-10-CM | POA: Insufficient documentation

## 2017-01-16 DIAGNOSIS — Z96641 Presence of right artificial hip joint: Secondary | ICD-10-CM | POA: Insufficient documentation

## 2017-01-16 DIAGNOSIS — M542 Cervicalgia: Secondary | ICD-10-CM | POA: Insufficient documentation

## 2017-01-16 DIAGNOSIS — Z79899 Other long term (current) drug therapy: Secondary | ICD-10-CM | POA: Diagnosis not present

## 2017-01-16 DIAGNOSIS — Z87891 Personal history of nicotine dependence: Secondary | ICD-10-CM | POA: Insufficient documentation

## 2017-01-16 DIAGNOSIS — I10 Essential (primary) hypertension: Secondary | ICD-10-CM | POA: Insufficient documentation

## 2017-01-16 DIAGNOSIS — Z7982 Long term (current) use of aspirin: Secondary | ICD-10-CM | POA: Diagnosis not present

## 2017-01-16 MED ORDER — IBUPROFEN 400 MG PO TABS
400.0000 mg | ORAL_TABLET | Freq: Once | ORAL | Status: AC | PRN
  Administered 2017-01-16: 400 mg via ORAL
  Filled 2017-01-16: qty 1

## 2017-01-16 MED ORDER — NAPROXEN 250 MG PO TABS
500.0000 mg | ORAL_TABLET | Freq: Once | ORAL | Status: AC
  Administered 2017-01-16: 500 mg via ORAL
  Filled 2017-01-16: qty 2

## 2017-01-16 MED ORDER — METHOCARBAMOL 500 MG PO TABS
500.0000 mg | ORAL_TABLET | Freq: Once | ORAL | Status: AC
Start: 2017-01-16 — End: 2017-01-16
  Administered 2017-01-16: 500 mg via ORAL
  Filled 2017-01-16: qty 1

## 2017-01-16 MED ORDER — NAPROXEN 500 MG PO TABS: 500.0000 mg | ORAL_TABLET | Freq: Two times a day (BID) | ORAL | 0 refills | Status: DC | PRN

## 2017-01-16 MED ORDER — METHOCARBAMOL 500 MG PO TABS: 500.0000 mg | ORAL_TABLET | Freq: Three times a day (TID) | ORAL | 0 refills | Status: DC | PRN
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
## 2017-02-08 MED FILL — oxyCODONE HCL 5 MG TABS: 5 | 30 days supply | Qty: 60 | Fill #0

## 2017-02-13 ENCOUNTER — Ambulatory Visit: Payer: Medicaid Other

## 2017-02-18 ENCOUNTER — Ambulatory Visit: Payer: Medicaid Other | Admitting: Family Medicine

## 2017-04-24 ENCOUNTER — Ambulatory Visit: Payer: Medicaid Other | Admitting: Family Medicine

## 2017-05-14 ENCOUNTER — Ambulatory Visit: Payer: Medicaid Other | Admitting: Family Medicine

## 2017-05-28 ENCOUNTER — Ambulatory Visit: Payer: Medicaid Other | Admitting: Family Medicine

## 2017-06-05 ENCOUNTER — Encounter: Payer: Self-pay | Admitting: Family Medicine

## 2017-06-05 ENCOUNTER — Ambulatory Visit: Payer: Medicaid Other | Attending: Family Medicine | Admitting: Family Medicine

## 2017-06-05 VITALS — BP 144/78 | HR 68 | Temp 97.6°F | Ht 73.0 in | Wt 328.0 lb

## 2017-06-05 DIAGNOSIS — E8881 Metabolic syndrome: Secondary | ICD-10-CM | POA: Diagnosis not present

## 2017-06-05 DIAGNOSIS — I10 Essential (primary) hypertension: Secondary | ICD-10-CM | POA: Insufficient documentation

## 2017-06-05 DIAGNOSIS — B351 Tinea unguium: Secondary | ICD-10-CM | POA: Diagnosis not present

## 2017-06-05 DIAGNOSIS — Z7982 Long term (current) use of aspirin: Secondary | ICD-10-CM | POA: Insufficient documentation

## 2017-06-05 DIAGNOSIS — Z96641 Presence of right artificial hip joint: Secondary | ICD-10-CM | POA: Insufficient documentation

## 2017-06-05 DIAGNOSIS — G4739 Other sleep apnea: Secondary | ICD-10-CM | POA: Insufficient documentation

## 2017-06-05 DIAGNOSIS — Z79899 Other long term (current) drug therapy: Secondary | ICD-10-CM | POA: Diagnosis not present

## 2017-06-05 DIAGNOSIS — E669 Obesity, unspecified: Secondary | ICD-10-CM | POA: Insufficient documentation

## 2017-06-05 MED ORDER — CETIRIZINE HCL 10 MG PO TABS: 10.0000 mg | ORAL_TABLET | Freq: Every day | ORAL | 1 refills | Status: DC

## 2017-06-05 MED ORDER — PRAVASTATIN SODIUM 20 MG PO TABS: 20.0000 mg | ORAL_TABLET | Freq: Every day | ORAL | 1 refills | Status: DC

## 2017-06-05 MED ORDER — NAPROXEN 500 MG PO TABS: 500.0000 mg | ORAL_TABLET | Freq: Two times a day (BID) | ORAL | 1 refills | Status: DC | PRN

## 2017-06-05 MED ORDER — HYDROCHLOROTHIAZIDE 25 MG PO TABS: 25.0000 mg | ORAL_TABLET | Freq: Every day | ORAL | 1 refills | Status: DC

## 2017-06-05 MED ORDER — ASPIRIN EC 81 MG PO TBEC: 81.0000 mg | DELAYED_RELEASE_TABLET | Freq: Every day | ORAL | 1 refills | Status: DC

## 2017-06-05 MED FILL — NAPROXEN 500 MG TABLET: 500 | 30 days supply | Qty: 60 | Fill #0

## 2017-06-05 MED FILL — PRAVASTATIN NA 20 MG TAB: 20 | 30 days supply | Qty: 30 | Fill #0

## 2017-06-05 MED FILL — HYDROCHLOROTHIAZIDE 25 MG T: 25 | 30 days supply | Qty: 30 | Fill #0

## 2017-06-05 NOTE — Patient Instructions (Signed)

## 2017-06-05 NOTE — Progress Notes (Signed)
Patient is sleeping a lot and is very concerned.  Patient want referral to Podiatry.

## 2017-06-05 NOTE — Progress Notes (Signed)
Subjective:  Patient ID: Brandon Finley, male    DOB: 06-Oct-1960  Age: 57 y.o. MRN: [id number]  CC: Hypertension   HPI DAT DERKSEN is a 57 year old male with a history of hypertension, metabolic syndrome, obesity, right total hip arthroplasty in 02/2016 who presents today for follow-up visit. He endorses compliance with his antihypertensive and denies adverse effect but does not exercise and is not adherent with a low-sodium, DASH diet. He complains of daytime somnolence, daytime fatigue and endorses snoring at night but has never had a sleep study.  He is cognizant of his being obese and is seeking help with regards to meal planning and weight loss. He complains of darkened toenails for the last few years and would like to see a podiatrist to have his foot taking care of. His right hip hurts once in a while and he no longer has the oxycodone pills which she previously received from his orthopedic surgeon.  Past Medical History:  Diagnosis Date  . Arthritis   . Hypertension     Past Surgical History:  Procedure Laterality Date  . TOTAL HIP ARTHROPLASTY Right 03/13/2016   Procedure: RIGHT TOTAL HIP ARTHROPLASTY ANTERIOR APPROACH;  Surgeon: Mcarthur Rossetti, MD;  Location: [place];  Service: Orthopedics;  Laterality: Right;    Allergies  Allergen Reactions  . No Known Allergies      Outpatient Medications Prior to Visit  Medication Sig Dispense Refill  . methocarbamol (ROBAXIN) 500 MG tablet Take 1 tablet (500 mg total) by mouth every 8 (eight) hours as needed for muscle spasms. 30 tablet 0  . aspirin EC 81 MG tablet Take 1 tablet (81 mg total) by mouth daily. 30 tablet 3  . hydrochlorothiazide (HYDRODIURIL) 25 MG tablet Take 1 tablet (25 mg total) by mouth daily. 90 tablet 1  . naproxen (NAPROSYN) 500 MG tablet Take 1 tablet (500 mg total) by mouth 2 (two) times daily as needed. 30 tablet 0  . pravastatin (PRAVACHOL) 20 MG tablet Take 1 tablet (20 mg total) by mouth  daily. 90 tablet 1   No facility-administered medications prior to visit.     ROS Review of Systems  Constitutional: Negative for activity change and appetite change.  HENT: Negative for sinus pressure and sore throat.   Eyes: Negative for visual disturbance.  Respiratory: Negative for cough, chest tightness and shortness of breath.   Cardiovascular: Negative for chest pain and leg swelling.  Gastrointestinal: Negative for abdominal distention, abdominal pain, constipation and diarrhea.  Endocrine: Negative.   Genitourinary: Negative for dysuria.  Musculoskeletal: Negative for joint swelling and myalgias.  Skin: Negative for rash.  Allergic/Immunologic: Negative.   Neurological: Negative for weakness, light-headedness and numbness.  Psychiatric/Behavioral: Negative for dysphoric mood and suicidal ideas.    Objective:  BP (!) 144/78   Pulse 68   Temp 97.6 F (36.4 C) (Oral)   Ht '6\' 1"'  (1.854 m)   Wt (!) 328 lb (148.8 kg)   SpO2 95%   BMI 43.27 kg/m   BP/Weight 06/05/2017 06/21/8936 1/0/1751  Systolic BP 025 852 778  Diastolic BP 78 96 84  Wt. (Lbs) 328 300 320  BMI 43.27 39.58 42.22      Physical Exam  Constitutional: He is oriented to person, place, and time. He appears well-developed and well-nourished.  Morbidly obese  Cardiovascular: Normal rate, normal heart sounds and intact distal pulses.  No murmur heard. Pulmonary/Chest: Effort normal and breath sounds normal. He has no wheezes. He has  no rales. He exhibits no tenderness.  Abdominal: Soft. Bowel sounds are normal. He exhibits no distension and no mass. There is no tenderness.  Musculoskeletal: Normal range of motion.  Neurological: He is alert and oriented to person, place, and time.  Skin: Skin is warm and dry.  Darkened thickened big toenails  Psychiatric: He has a normal mood and affect.     Assessment & Plan:   1. Metabolic syndrome Will benefit from weight loss - pravastatin (PRAVACHOL) 20 MG  tablet; Take 1 tablet (20 mg total) by mouth daily.  Dispense: 90 tablet; Refill: 1 - aspirin EC 81 MG tablet; Take 1 tablet (81 mg total) by mouth daily.  Dispense: 90 tablet; Refill: 1 - Amb ref to Medical Nutrition Therapy-MNT - Hemoglobin A1c; Future  2. Essential hypertension Slightly elevated blood pressure Low sodium, DASH diet - CMP14+EGFR; Future - Lipid panel; Future - hydrochlorothiazide (HYDRODIURIL) 25 MG tablet; Take 1 tablet (25 mg total) by mouth daily.  Dispense: 90 tablet; Refill: 1  3. Other sleep apnea Symptoms are suspicious for sleep apnea - Split night study; Future  4. Onychomycosis - Ambulatory referral to Podiatry   Meds ordered this encounter  Medications  . cetirizine (ZYRTEC) 10 MG tablet    Sig: Take 1 tablet (10 mg total) by mouth daily.    Dispense:  30 tablet    Refill:  1  . pravastatin (PRAVACHOL) 20 MG tablet    Sig: Take 1 tablet (20 mg total) by mouth daily.    Dispense:  90 tablet    Refill:  1  . aspirin EC 81 MG tablet    Sig: Take 1 tablet (81 mg total) by mouth daily.    Dispense:  90 tablet    Refill:  1  . naproxen (NAPROSYN) 500 MG tablet    Sig: Take 1 tablet (500 mg total) by mouth 2 (two) times daily as needed.    Dispense:  60 tablet    Refill:  1  . hydrochlorothiazide (HYDRODIURIL) 25 MG tablet    Sig: Take 1 tablet (25 mg total) by mouth daily.    Dispense:  90 tablet    Refill:  1    Follow-up: Return in about 3 months (around 09/05/2017) for Follow-up of obesity.   Charlott Rakes MD

## 2017-06-06 ENCOUNTER — Other Ambulatory Visit: Payer: Self-pay | Admitting: Family Medicine

## 2017-06-06 ENCOUNTER — Telehealth: Payer: Self-pay

## 2017-06-06 LAB — LIPID PANEL
CHOL/HDL RATIO: 5.1 ratio — AB (ref 0.0–5.0)
Cholesterol, Total: 179 mg/dL (ref 100–199)
HDL: 35 mg/dL — AB (ref >39)
LDL CALC: 121 mg/dL — AB (ref 0–99)
TRIGLYCERIDES: 115 mg/dL (ref 0–149)
VLDL Cholesterol Cal: 23 mg/dL (ref 5–40)

## 2017-06-06 LAB — CMP14+EGFR
A/G RATIO: 1.4 (ref 1.2–2.2)
ALBUMIN: 4.4 g/dL (ref 3.5–5.5)
ALK PHOS: 86 IU/L (ref 39–117)
ALT: 24 IU/L (ref 0–44)
AST: 21 IU/L (ref 0–40)
BILIRUBIN TOTAL: 0.4 mg/dL (ref 0.0–1.2)
BUN / CREAT RATIO: 11 (ref 9–20)
BUN: 7 mg/dL (ref 6–24)
CHLORIDE: 100 mmol/L (ref 96–106)
CO2: 26 mmol/L (ref 20–29)
Calcium: 9.4 mg/dL (ref 8.7–10.2)
Creatinine, Ser: 0.66 mg/dL — ABNORMAL LOW (ref 0.76–1.27)
GFR calc Af Amer: 125 mL/min/{1.73_m2} (ref >59)
GFR calc non Af Amer: 108 mL/min/{1.73_m2} (ref >59)
GLUCOSE: 84 mg/dL (ref 65–99)
Globulin, Total: 3.2 g/dL (ref 1.5–4.5)
POTASSIUM: 4.3 mmol/L (ref 3.5–5.2)
Sodium: 140 mmol/L (ref 134–144)
Total Protein: 7.6 g/dL (ref 6.0–8.5)

## 2017-06-06 LAB — HEMOGLOBIN A1C
Est. average glucose Bld gHb Est-mCnc: 137 mg/dL
HEMOGLOBIN A1C: 6.4 % — AB (ref 4.8–5.6)

## 2017-06-06 MED ORDER — METFORMIN HCL 500 MG PO TABS: 500.0000 mg | ORAL_TABLET | Freq: Two times a day (BID) | ORAL | 6 refills | Status: DC

## 2017-06-06 MED FILL — metFORMIN HCL 500 MG TABS: 500 | 30 days supply | Qty: 60 | Fill #0

## 2017-06-06 NOTE — Telephone Encounter (Signed)
-----   Message from Hoy Register, MD sent at 06/06/2017 12:49 PM EDT ----- A1c is 6.4 which has trended up from 5.5 previously and puts him in the prediabetic range.  I have sent a prescription for metformin to his pharmacy for this.  His LDL (bad cholesterol) is also slightly elevated and I will need him to comply with pravastatin as well as a low-cholesterol diet and lifestyle modifications.

## 2017-06-06 NOTE — Telephone Encounter (Signed)
Patient was called and informed of lab results. 

## 2017-06-24 ENCOUNTER — Ambulatory Visit: Payer: Medicaid Other | Admitting: Skilled Nursing Facility1

## 2017-07-03 ENCOUNTER — Emergency Department (HOSPITAL_COMMUNITY)
Admission: EM | Admit: 2017-07-03 | Discharge: 2017-07-03 | Disposition: A | Discharge status: Home / Self Care | Payer: Medicaid Other | Attending: Emergency Medicine | Admitting: Emergency Medicine

## 2017-07-03 ENCOUNTER — Encounter (HOSPITAL_COMMUNITY): Payer: Self-pay

## 2017-07-03 DIAGNOSIS — Z87891 Personal history of nicotine dependence: Secondary | ICD-10-CM | POA: Insufficient documentation

## 2017-07-03 DIAGNOSIS — M62838 Other muscle spasm: Secondary | ICD-10-CM

## 2017-07-03 DIAGNOSIS — M545 Low back pain, unspecified: Secondary | ICD-10-CM

## 2017-07-03 DIAGNOSIS — M62830 Muscle spasm of back: Secondary | ICD-10-CM | POA: Diagnosis not present

## 2017-07-03 DIAGNOSIS — I10 Essential (primary) hypertension: Secondary | ICD-10-CM | POA: Insufficient documentation

## 2017-07-03 DIAGNOSIS — Z96641 Presence of right artificial hip joint: Secondary | ICD-10-CM | POA: Insufficient documentation

## 2017-07-03 DIAGNOSIS — Z7982 Long term (current) use of aspirin: Secondary | ICD-10-CM | POA: Insufficient documentation

## 2017-07-03 DIAGNOSIS — Z7984 Long term (current) use of oral hypoglycemic drugs: Secondary | ICD-10-CM | POA: Diagnosis not present

## 2017-07-03 MED ORDER — NAPROXEN 500 MG PO TABS: 500.0000 mg | ORAL_TABLET | Freq: Two times a day (BID) | ORAL | 0 refills | Status: DC

## 2017-07-03 MED ORDER — METHOCARBAMOL 500 MG PO TABS: 500.0000 mg | ORAL_TABLET | Freq: Every evening | ORAL | 0 refills | Status: DC | PRN

## 2017-07-03 MED FILL — METHOCARBAMOL 500 MG TABS: 500 | 10 days supply | Qty: 10 | Fill #0

## 2017-07-03 MED FILL — NAPROXEN 500 MG TABLET: 500 | 15 days supply | Qty: 30 | Fill #0

## 2017-07-03 NOTE — ED Triage Notes (Signed)
Patient reports lower back pain since awakening this am, reports that it feels tight, no injury. Remote hx of same

## 2017-07-03 NOTE — Discharge Instructions (Addendum)
Take naproxen 2 times a day with meals.  Do not take other anti-inflammatories at the same time open (Advil, Motrin, ibuprofen, Aleve). You may supplement with Tylenol if you need further pain control. Use Robaxin as needed for muscle stiffness or soreness. Have caution, as this may make you tired or groggy. Do not drive or operate heavy machinery while taking this medication.  Use muscle creams (bengay, icy hot, salonpas) as needed for pain.  Use heat for pain relief. Be careful with body mechanics, especially when lifting.  Weight loss will help prevent future back pain. There is information about diet in the paperwork. In the instructions called diet for metabolic syndrome, there is a list of foods that are good and not good. If you are still struggling, you may ask your primary care doctor to send you to a nutritionist/dietary specialist.  Follow up with your orthopedic doctor if your back pain is not improving with this treatment in 1-2 weeks.  Return to the ER if you develop high fevers, numbness, loss of bowel or bladder control, or any new or concerning symptoms.

## 2017-07-03 NOTE — ED Provider Notes (Signed)
[facility] EMERGENCY DEPARTMENT Provider Note   CSN: [id number] Arrival date & time: 07/03/17  1026     History   Chief Complaint No chief complaint on file.   HPI Brandon Finley is a 57 y.o. male presenting for evaluation of low back pain.  Patient states that when he woke up this morning, his low back was tight.  He describes pain as spasming.  He has a history of chronic back problems.  He took a hot shower, and his pain has improved but is still there.  It is worse with movement.  He has not taken anything for pain including Tylenol or ibuprofen.  There is no radiation of the pain down his legs.  He denies fevers, chills, rash, loss of bowel or bladder control, numbness, history of cancer, or history of IV drug use.  He is also requesting information about diet to assist with weight loss.  He sees Dr. Magnus Ivan with orthopedics.  He reports no medical problems, states he takes no medications daily.  He states he is in a job where he does lots of lifting and movement.  He does not remember specific incidents of injury, no falls or trauma.  HPI  Past Medical History:  Diagnosis Date  . Arthritis   . Hypertension     Patient Active Problem List   Diagnosis Date Noted  . Hypertension 06/22/2016  . 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 Post-traumatic osteoarthritis of right hip 09/14/2015  . Right hip pain 09/14/2015  . Prediabetes 07/25/2012  . Elevated blood pressure 07/25/2012  . Morbid obesity (HCC) 07/25/2012  . Metabolic syndrome 07/25/2012    Past Surgical History:  Procedure Laterality Date  . TOTAL HIP ARTHROPLASTY Right 03/13/2016   Procedure: RIGHT TOTAL HIP ARTHROPLASTY ANTERIOR APPROACH;  Surgeon: Kathryne Hitch, MD;  Location: [facility];  Service: Orthopedics;  Laterality: Right;        Home Medications    Prior to Admission medications   Medication Sig Start Date End Date Taking? Authorizing Provider  aspirin  EC 81 MG tablet Take 1 tablet (81 mg total) by mouth daily. 06/05/17   Hoy Register, MD  cetirizine (ZYRTEC) 10 MG tablet Take 1 tablet (10 mg total) by mouth daily. 06/05/17   Hoy Register, MD  hydrochlorothiazide (HYDRODIURIL) 25 MG tablet Take 1 tablet (25 mg total) by mouth daily. 06/05/17   Hoy Register, MD  metFORMIN (GLUCOPHAGE) 500 MG tablet Take 1 tablet (500 mg total) by mouth 2 (two) times daily with a meal. 06/06/17   Hoy Register, MD  methocarbamol (ROBAXIN) 500 MG tablet Take 1 tablet (500 mg total) by mouth at bedtime as needed for muscle spasms. 07/03/17   Jeter Tomey, PA-C  naproxen (NAPROSYN) 500 MG tablet Take 1 tablet (500 mg total) by mouth 2 (two) times daily with a meal. 07/03/17   Stirling Orton, PA-C  pravastatin (PRAVACHOL) 20 MG tablet Take 1 tablet (20 mg total) by mouth daily. 06/05/17   Hoy Register, MD    Family History Family History  Problem Relation Age of Onset  . Hypertension Father     Social History Social History   Tobacco Use  . Smoking status: Former Smoker    Last attempt to quit: 01/16/1995    Years since quitting: 22.4  . Smokeless tobacco: Never Used  Substance Use Topics  . Alcohol use: No  . Drug use: No     Allergies   No known  allergies   Review of Systems Review of Systems  Constitutional: Negative for fever.  Musculoskeletal: Positive for back pain.  Hematological: Does not bruise/bleed easily.     Physical Exam Updated Vital Signs BP (!) 163/90 (BP Location: Left Arm)   Pulse 74   Temp 99 F (37.2 C) (Oral)   Resp 16   Ht 6\' 1"  (1.854 m)   Wt (!) 147.9 kg (326 lb)   SpO2 96%   BMI 43.01 kg/m   Physical Exam  Constitutional: He is oriented to person, place, and time. He appears well-developed and well-nourished. No distress.  Obese male in no apparent distress.  HENT:  Head: Normocephalic and atraumatic.  Eyes: Pupils are equal, round, and reactive to light. Conjunctivae and EOM are normal.    Neck: Normal range of motion. Neck supple.  Cardiovascular: Normal rate, regular rhythm and intact distal pulses.  Pulmonary/Chest: Effort normal and breath sounds normal. No respiratory distress. He has no wheezes.  Abdominal: Soft. He exhibits no distension. There is no tenderness.  Musculoskeletal: Normal range of motion. He exhibits tenderness.  Tenderness to palpation of low back bilaterally without increased pain over midline spine.  No tenderness to palpation of upper back.  No step-offs or deformities.  Patient is ambulatory.  Strength intact x4.  Sensation intact x4.  Radial and pedal pulses intact bilaterally.  No pain with straight leg raise.  Neurological: He is alert and oriented to person, place, and time. No sensory deficit.  No saddle paraesthesias   Skin: Skin is warm and dry.  Psychiatric: He has a normal mood and affect.  Nursing note and vitals reviewed.    ED Treatments / Results  Labs (all labs ordered are listed, but only abnormal results are displayed) Labs Reviewed - No data to display  EKG None  Radiology No results found.  Procedures Procedures (including critical care time)  Medications Ordered in ED Medications - No data to display   Initial Impression / Assessment and Plan / ED Course  I have reviewed the triage vital signs and the nursing notes.  Pertinent labs & imaging results that were available during my care of the patient were reviewed by me and considered in my medical decision making (see chart for details).     Patient presenting for evaluation of low back pain.  Physical exam reassuring, neurovascularly intact.  No red flags for back pain.  Pain is reproducible with palpation of the musculature.  Likely musculoskeletal.  Doubt fracture, I do not believe x-rays will be beneficial.  Doubt vertebral injury, infection, spinal cord compression, myelopathy, or cauda equina syndrome.  Will treat symptomatically with NSAIDs, muscle relaxers,  muscle creams.  Patient to follow-up with orthopedics as needed.  Discussed diet and body mechanics while lifting.  At this time, patient appears safe for discharge.  Return precautions given.  Patient states he understands agrees plan.  Final Clinical Impressions(s) / ED Diagnoses   Final diagnoses:  Acute bilateral low back pain without sciatica  Muscle spasm    ED Discharge Orders        Ordered    methocarbamol (ROBAXIN) 500 MG tablet  At bedtime PRN     07/03/17 1122    naproxen (NAPROSYN) 500 MG tablet  2 times daily with meals     07/03/17 1122       Zoriyah Scheidegger, PA-C 07/03/17 1201    Mesner, Barbara CowerJason, MD 07/04/17 1027

## 2017-07-09 ENCOUNTER — Encounter: Payer: Medicaid Other | Attending: Family Medicine | Admitting: Skilled Nursing Facility1

## 2017-07-09 ENCOUNTER — Encounter: Payer: Self-pay | Admitting: Skilled Nursing Facility1

## 2017-07-09 ENCOUNTER — Ambulatory Visit (HOSPITAL_BASED_OUTPATIENT_CLINIC_OR_DEPARTMENT_OTHER): Payer: Medicaid Other

## 2017-07-09 DIAGNOSIS — E8881 Metabolic syndrome: Secondary | ICD-10-CM | POA: Diagnosis not present

## 2017-07-09 DIAGNOSIS — Z713 Dietary counseling and surveillance: Secondary | ICD-10-CM | POA: Insufficient documentation

## 2017-07-09 NOTE — Progress Notes (Signed)
  Assessment:  Primary concerns today: obesity.   Pt is very serious about loseing weight. Pt states he has had a hip replacement that is going well. Pt states he lives alone. Pt states he does not want to eat/be unhealthy anymore. 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 Pt states he is ready to make whatever changes he needs to make.  Pt is talkative.    MEDICATIONS: see list   DIETARY INTAKE:  Usual eating pattern includes 2 meals and 0 snacks per day.  Everyday foods include none stated.  Avoided foods include none stated.    24-hr recall:  B ( AM): skipped Snk ( AM):  L ( 12PM): fast food Snk ( PM):  D ( PM): baked spagetti or fish or fried chicken Snk ( PM):  Beverages: water, juice, soda,   Usual physical activity: ADL's    Intervention:  Nutrition counseling. Goals: -Walk for 10 minutes 5 days a week at the nearest field   -Try oatmeal with half a banana, spoon of brown sugar, and a handful of nuts  -Have breakfast 4 days a week until your next appointment   -Only drink 4 cans of soda a say  Teaching Method Utilized:  Visual Auditory Hands on  Handouts given during visit include:  Meal ideas  Barriers to learning/adherence to lifestyle change: none identified  Demonstrated degree of understanding via:  Teach Back   Monitoring/Evaluation:  Dietary intake, exercise,and body weight prn.

## 2017-07-09 NOTE — Patient Instructions (Addendum)
-  Walk for 10 minutes 5 days a week at the nearest field   -Try oatmeal with half a banana, spoon of brown sugar, and a handful of nuts  -Have breakfast 4 days a week until your next appointment   -Only drink 4 cans of soda a say

## 2017-07-16 ENCOUNTER — Ambulatory Visit: Payer: Self-pay | Admitting: Podiatry

## 2017-08-06 ENCOUNTER — Ambulatory Visit: Payer: Medicaid Other | Admitting: Skilled Nursing Facility1

## 2017-08-20 ENCOUNTER — Ambulatory Visit: Payer: Medicaid Other | Admitting: Podiatry

## 2017-08-20 ENCOUNTER — Encounter: Payer: Self-pay | Admitting: Podiatry

## 2017-08-20 DIAGNOSIS — B351 Tinea unguium: Secondary | ICD-10-CM | POA: Diagnosis not present

## 2017-08-20 DIAGNOSIS — M79675 Pain in left toe(s): Secondary | ICD-10-CM

## 2017-08-20 DIAGNOSIS — M79674 Pain in right toe(s): Secondary | ICD-10-CM | POA: Diagnosis not present

## 2017-08-20 DIAGNOSIS — E119 Type 2 diabetes mellitus without complications: Secondary | ICD-10-CM

## 2017-08-20 NOTE — Progress Notes (Signed)
This patient presents to the office with chief complaint of long thick nails and diabetic feet.  This patient  says there  is  no pain and discomfort in his  feet.  This patient says there are long thick painful nails and he has been unable to self treat due to hip arthritis.  These nails are painful walking and wearing shoes.  Patient has no history of infection or drainage from both feet.  Patient is unable to  self treat his own nails . This patient presents  to the office today for treatment of the  long nails and a foot evaluation due to history of  Diabetes. He is also concerned about his dry feet.  General Appearance  Alert, conversant and in no acute stress.  Vascular  Dorsalis pedis and posterior tibial  pulses are palpable  bilaterally.  Capillary return is within normal limits  bilaterally. Temperature is within normal limits  bilaterally.  Neurologic  Senn-Weinstein monofilament wire test within normal limits  bilaterally. Muscle power within normal limits bilaterally.  Nails Thick disfigured discolored nails with subungual debris  from hallux to fifth toes bilaterally. No evidence of bacterial infection or drainage bilaterally.  Orthopedic  No limitations of motion of motion feet .  No crepitus or effusions noted.  No bony pathology or digital deformities noted.  Skin  normotropic skin with no porokeratosis noted bilaterally.  No signs of infections or ulcers noted. Dry skin noted.    Onychomycosis  Diabetes with no foot complications  IE  Debride nails x 10.  A diabetic foot exam was performed and there is no evidence of any vascular or neurologic pathology.  Patient was told to use vaseline faithfully for his dry skin.  RTC 3 months.   Helane GuntherGregory Shwanda Soltis DPM

## 2017-09-04 ENCOUNTER — Ambulatory Visit: Payer: Medicaid Other | Admitting: Family Medicine

## 2017-09-11 ENCOUNTER — Encounter (HOSPITAL_COMMUNITY): Payer: Self-pay

## 2017-09-11 ENCOUNTER — Emergency Department (HOSPITAL_COMMUNITY): Payer: Medicaid Other

## 2017-09-11 ENCOUNTER — Emergency Department (HOSPITAL_COMMUNITY)
Admission: EM | Admit: 2017-09-11 | Discharge: 2017-09-11 | Disposition: A | Discharge status: Home / Self Care | Payer: Medicaid Other | Attending: Emergency Medicine | Admitting: Emergency Medicine

## 2017-09-11 ENCOUNTER — Other Ambulatory Visit: Payer: Self-pay

## 2017-09-11 DIAGNOSIS — Z7982 Long term (current) use of aspirin: Secondary | ICD-10-CM | POA: Diagnosis not present

## 2017-09-11 DIAGNOSIS — Z96641 Presence of right artificial hip joint: Secondary | ICD-10-CM | POA: Insufficient documentation

## 2017-09-11 DIAGNOSIS — Z79899 Other long term (current) drug therapy: Secondary | ICD-10-CM | POA: Insufficient documentation

## 2017-09-11 DIAGNOSIS — Z87891 Personal history of nicotine dependence: Secondary | ICD-10-CM | POA: Diagnosis not present

## 2017-09-11 DIAGNOSIS — M25551 Pain in right hip: Secondary | ICD-10-CM | POA: Diagnosis present

## 2017-09-11 DIAGNOSIS — I10 Essential (primary) hypertension: Secondary | ICD-10-CM | POA: Insufficient documentation

## 2017-09-11 MED ORDER — LIDOCAINE 5 % EX PTCH
1.0000 | MEDICATED_PATCH | CUTANEOUS | Status: DC
  Administered 2017-09-11: 1 via TRANSDERMAL
  Filled 2017-09-11: qty 1

## 2017-09-11 MED ORDER — METHOCARBAMOL 500 MG PO TABS: 500.0000 mg | ORAL_TABLET | Freq: Two times a day (BID) | ORAL | 0 refills | Status: DC

## 2017-09-11 MED FILL — METHOCARBAMOL 500 MG TABS: 500 | 10 days supply | Qty: 20 | Fill #0

## 2017-09-11 NOTE — ED Triage Notes (Signed)
Pt states that had a hip surgery about a year ago, tried to go back to work and pain in hip has been increasing, denies injury

## 2017-09-11 NOTE — ED Provider Notes (Signed)
[facility] Provider Note   CSN: [id number] Arrival date & time: 09/11/17  0636     History   Chief Complaint Chief Complaint  Patient presents with  . Hip Pain    HPI Brandon Finley is a 57 y.o. male.  HPI   Patient is a 57 year old male with a history of arthritis, retention, status post right total hip arthroplasty in February 2018 presenting for right hip pain.  Patient reports that he has had intermittent "sharp" and "shooting" pain since February 2019 and has posterior right hip.  Patient denies any increase in the pain, or change in the quality over this time recently.  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  Patient denies any fevers, chills, erythema or edema of the right hip, weakness or numbness in the right lower extremity, or gait disturbance.  Past Medical History:  Diagnosis Date  . Arthritis   . Hypertension     Patient Active Problem List   Diagnosis Date Noted  . Hypertension 06/22/2016  . 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 Post-traumatic osteoarthritis of right hip 09/14/2015  . Right hip pain 09/14/2015  . Prediabetes 07/25/2012  . Elevated blood pressure 07/25/2012  . Morbid obesity (HCC) 07/25/2012  . Metabolic syndrome 07/25/2012    Past Surgical History:  Procedure Laterality Date  . TOTAL HIP ARTHROPLASTY Right 03/13/2016   Procedure: RIGHT TOTAL HIP ARTHROPLASTY ANTERIOR APPROACH;  Surgeon: Kathryne Hitchhristopher Y Blackman, MD;  Location[facility] OR;  Service: Orthopedics;  Laterality: Right;        Home Medications    Prior to Admission medications   Medication Sig Start Date End Date Taking? Authorizing Provider  aspirin EC 81 MG tablet Take 1 tablet (81 mg total) by mouth daily. 06/05/17   Hoy RegisterNewlin, Enobong, MD  cetirizine (ZYRTEC) 10 MG tablet Take 1 tablet (10 mg total) by mouth  daily. 06/05/17   Hoy RegisterNewlin, Enobong, MD  hydrochlorothiazide (HYDRODIURIL) 25 MG tablet Take 1 tablet (25 mg total) by mouth daily. 06/05/17   Hoy RegisterNewlin, Enobong, MD  metFORMIN (GLUCOPHAGE) 500 MG tablet Take 1 tablet (500 mg total) by mouth 2 (two) times daily with a meal. 06/06/17   Hoy RegisterNewlin, Enobong, MD  methocarbamol (ROBAXIN) 500 MG tablet Take 1 tablet (500 mg total) by mouth at bedtime as needed for muscle spasms. 07/03/17   Caccavale, Sophia, PA-C  naproxen (NAPROSYN) 500 MG tablet Take 1 tablet (500 mg total) by mouth 2 (two) times daily with a meal. 07/03/17   Caccavale, Sophia, PA-C  pravastatin (PRAVACHOL) 20 MG tablet Take 1 tablet (20 mg total) by mouth daily. 06/05/17   Hoy RegisterNewlin, Enobong, MD    Family History Family History  Problem Relation Age of Onset  . Hypertension Father     Social History Social History   Tobacco Use  . Smoking status: Former Smoker    Last attempt to quit: 01/16/1995    Years since quitting: 22.6  . Smokeless tobacco: Never Used  Substance Use Topics  . Alcohol use: No  . Drug use: No     Allergies   No known allergies   Review of Systems Review of Systems  Constitutional: Negative for chills and fever.  Musculoskeletal: Positive for arthralgias and back pain. Negative for gait problem and joint swelling.  Neurological: Negative for weakness and numbness.     Physical Exam  Updated Vital Signs BP (!) 159/106   Pulse 84   Temp 98 F (36.7 C) (Oral)   Resp 20   Ht 6\' 1"  (1.854 m)   Wt (!) 145.2 kg   SpO2 94%   BMI 42.22 kg/m   Physical Exam  Constitutional: He appears well-developed and well-nourished. No distress.  Sitting comfortably in bed.  HENT:  Head: Normocephalic and atraumatic.  Eyes: Conjunctivae are normal. Right eye exhibits no discharge. Left eye exhibits no discharge.  EOMs normal to gross examination.  Neck: Normal range of motion.  Cardiovascular: Normal rate and regular rhythm.  Intact, 2+ DP pulses, symmetric  bilaterally.  Pulmonary/Chest:  Normal respiratory effort. Patient converses comfortably. No audible wheeze or stridor.  Abdominal: He exhibits no distension.  Musculoskeletal: Normal range of motion.  Right hip with no erythema or edema.  Patient has tenderness to palpation of the right sacroiliac joint and ischial region.  No pain over greater trochanter.  Patient has full range of motion with internal and external rotation of the hip, and FABER/FADIR maneuvers with no crepitus.  All compartments of the right lower extremity are soft. Antalgic gait favoring right, but no evidence of right lower extremity weakness.  Neurological: He is alert.  Cranial nerves intact to gross observation. Patient moves extremities without difficulty.  Skin: Skin is warm and dry. He is not diaphoretic.  Psychiatric: He has a normal mood and affect. His behavior is normal. Judgment and thought content normal.  Nursing note and vitals reviewed.    ED Treatments / Results  Labs (all labs ordered are listed, but only abnormal results are displayed) Labs Reviewed - No data to display  EKG None  Radiology Dg Hip Unilat  With Pelvis 2-3 Views Right  Result Date: 09/11/2017 CLINICAL DATA:  Right hip pain over the past 4 months EXAM: DG HIP (WITH OR WITHOUT PELVIS) 2-3V RIGHT COMPARISON:  03/13/2016 radiographs FINDINGS: Right hip prosthesis noted without dislocation or asymmetric wear. There is some lateral spurring of the superolateral acetabulum and mild spurring elsewhere in the pelvis. Moderate left degenerative hip arthropathy with mild loss of articular space and moderate spurring. There is some subtle lucency proximally along the margins of the proximal stem common not quite 2 mm in thickness and hence not meeting typical threshold for aseptic loosening, but probably meriting surveillance. This is visible only along the proximal 2.5 cm of the stem. There is likely spurring along both sacroiliac joints,  chronic. IMPRESSION: 1. There is some very minimal lucency along the proximal margin of the stem of the right hip implant. This does not qualify as loosening at this time, but may merit surveillance. 2. 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 Electronically Signed   By: Gaylyn Rong M.D.   On: 09/11/2017 08:16    Procedures Procedures (including critical care time)  Medications Ordered in ED Medications  lidocaine (LIDODERM) 5 % 1 patch (has no administration in time range)     Initial Impression / Assessment and Plan / ED Course  I have reviewed the triage vital signs and the nursing notes.  Pertinent labs & imaging results that were available during my care of the patient were reviewed by me and considered in my medical decision making (see chart for details).  Clinical Course as of Sep 12 902  Wed Sep 11, 2017  1610 Reviewed results with patient encouraged follow-up with primary orthopedic surgeon, Dr. Magnus Ivan.  Patient given return precautions for any increasing pain, erythema, or  swelling of the hip.  Patient is in understanding and agrees with the plan of care.   [AM]    Clinical Course User Index [AM] Elisha Ponder, PA-C    Patient is nontoxic-appearing, afebrile, and neurologically intact in right lower extremity.  Different diagnosis includes arthritis of sacroiliac joint, lumbar spine with referred pain to hip, or bursitis.  No evidence of septic arthritis of the right hip.  Will apply lidocaine patch emergency department.  Patient's hardware in the right hip demonstrating possible 2.5 mm lucency, but not entirely diagnostic of loosening of the intramedullary hardware.  Will refer back to orthopedic surgery.  Discussed with patient differential diagnosis and symptomatic therapy.  Muscle relaxants prescribed.  Patient was instructed not to drive, drink alcohol, or operate machinery while taking muscle relaxants.  Return precautions were given for any erythema,  edema, worsening pain in the right hip.  Patient is in understanding and agrees with the plan of care.  Of note, patient had elevated blood pressure today.  Patient did not take antihypertensives at this morning.  Final Clinical Impressions(s) / ED Diagnoses   Final diagnoses:  Right hip pain  Elevated blood pressure reading with diagnosis of hypertension    ED Discharge Orders         Ordered    methocarbamol (ROBAXIN) 500 MG tablet  2 times daily     09/11/17 0904           Elisha Ponder, PA-C 09/11/17 1610    Cathren Laine, MD 09/12/17 1044

## 2017-09-11 NOTE — Discharge Instructions (Signed)
Please see the information and instructions below regarding your visit.  Your diagnoses today include:  1. Right hip pain   2. Elevated blood pressure reading with diagnosis of hypertension     Tests performed today include: See side panel of your discharge paperwork for testing performed today. Vital signs are listed at the bottom of these instructions.   X-ray of your right hip demonstrates that there may be some loosening of the nail within the hip.  This needs a follow-up with Dr. Magnus IvanBlackman.  I will send him an email.  Medications prescribed:    Take any prescribed medications only as prescribed, and any over the counter medications only as directed on the packaging.  You are prescribed Robaxin, a muscle relaxant. Some common side effects of this medication include:  Feeling sleepy.  Dizziness. Take care upon going from a seated to a standing position.  Dry mouth.  Feeling tired or weak.  Hard stools (constipation).  Upset stomach. These are not all of the side effects that may occur. If you have questions about side effects, call your doctor. Call your primary care provider for medical advice about side effects.  This medication can be sedating. Only take this medication as needed. Please do not combine with alcohol. Do not drive or operate machinery while taking this medication.   This medication can interact with some other medications. Make sure to tell any provider you are taking this medication before they prescribe you a new medication.    Home care instructions:  Please follow any educational materials contained in this packet.   Follow-up instructions: Please follow-up with your primary care provider within one month for blood pressure control and discussion of sleep apnea.  Please follow up with Dr. Magnus IvanBlackman of orthopedics regarding follow up imaging of your hip.  Return instructions:  Please return to the Emergency Department if you experience worsening symptoms.    Please return to the emergency department if you develop any worsening pain, swelling of your hip, or redness around it. Please return for any fever or chills with joint pain. Please return if you have any other emergent concerns.  Additional Information:   Your vital signs today were: BP (!) 159/106    Pulse 84    Temp 98 F (36.7 C) (Oral)    Resp 20    Ht 6\' 1"  (1.854 m)    Wt (!) 145.2 kg    SpO2 94%    BMI 42.22 kg/m  If your blood pressure (BP) was elevated on multiple readings during this visit above 130 for the top number or above 80 for the bottom number, please have this repeated by your primary care provider within one month. --------------  Thank you for allowing us to participate in your care today.

## 2017-09-24 ENCOUNTER — Ambulatory Visit: Payer: Medicaid Other | Attending: Family Medicine | Admitting: Family Medicine

## 2017-09-24 ENCOUNTER — Encounter: Payer: Self-pay | Admitting: Family Medicine

## 2017-09-24 VITALS — BP 154/96 | HR 73 | Temp 98.0°F | Ht 73.0 in | Wt 332.8 lb

## 2017-09-24 DIAGNOSIS — Z1211 Encounter for screening for malignant neoplasm of colon: Secondary | ICD-10-CM

## 2017-09-24 DIAGNOSIS — Z9889 Other specified postprocedural states: Secondary | ICD-10-CM | POA: Insufficient documentation

## 2017-09-24 DIAGNOSIS — Z09 Encounter for follow-up examination after completed treatment for conditions other than malignant neoplasm: Secondary | ICD-10-CM | POA: Insufficient documentation

## 2017-09-24 DIAGNOSIS — Z7982 Long term (current) use of aspirin: Secondary | ICD-10-CM | POA: Diagnosis not present

## 2017-09-24 DIAGNOSIS — E8881 Metabolic syndrome: Secondary | ICD-10-CM

## 2017-09-24 DIAGNOSIS — Z96641 Presence of right artificial hip joint: Secondary | ICD-10-CM | POA: Insufficient documentation

## 2017-09-24 DIAGNOSIS — Z23 Encounter for immunization: Secondary | ICD-10-CM | POA: Diagnosis not present

## 2017-09-24 DIAGNOSIS — M1990 Unspecified osteoarthritis, unspecified site: Secondary | ICD-10-CM | POA: Insufficient documentation

## 2017-09-24 DIAGNOSIS — I10 Essential (primary) hypertension: Secondary | ICD-10-CM | POA: Insufficient documentation

## 2017-09-24 DIAGNOSIS — R7303 Prediabetes: Secondary | ICD-10-CM | POA: Insufficient documentation

## 2017-09-24 DIAGNOSIS — Z79899 Other long term (current) drug therapy: Secondary | ICD-10-CM | POA: Insufficient documentation

## 2017-09-24 DIAGNOSIS — Z6841 Body Mass Index (BMI) 40.0 and over, adult: Secondary | ICD-10-CM | POA: Insufficient documentation

## 2017-09-24 DIAGNOSIS — Z125 Encounter for screening for malignant neoplasm of prostate: Secondary | ICD-10-CM

## 2017-09-24 DIAGNOSIS — Z7984 Long term (current) use of oral hypoglycemic drugs: Secondary | ICD-10-CM | POA: Diagnosis not present

## 2017-09-24 MED ORDER — LISINOPRIL-HYDROCHLOROTHIAZIDE 10-12.5 MG PO TABS: 1.0000 | ORAL_TABLET | Freq: Every day | ORAL | 1 refills | Status: DC

## 2017-09-24 MED FILL — LISINOPRIL-HCTZ 10-12.5 MG: 10-12.5 | 30 days supply | Qty: 30 | Fill #0

## 2017-09-24 NOTE — Progress Notes (Signed)
Referral for Colonoscopy.  Wants prostate checked today.

## 2017-09-24 NOTE — Progress Notes (Signed)
Subjective:  Patient ID: Brandon Finley, male    DOB: 1960-08-31  Age: 57 y.o. MRN: [id number]  CC: Diabetes   HPI Brandon Finley is a 57 year old male with a history of hypertension, metabolic syndrome, obesity, right total hip arthroplasty in 02/2016 who presents today for follow-up visit. His blood pressure is elevated despite his compliance with  His antihypertensive. He is concerned he has been unable to loose weight but has gained instead since his hip surgery. He does not exercise, eats late and is not mindful of his eating habits. For his Prediabetes he remains on Metformin. He is requesting a prostate cancer screen but denies family history of prostate cancer. He is also due for a colonoscopy.  Past Medical History:  Diagnosis Date  . Arthritis   . Hypertension     Past Surgical History:  Procedure Laterality Date  . TOTAL HIP ARTHROPLASTY Right 03/13/2016   Procedure: RIGHT TOTAL HIP ARTHROPLASTY ANTERIOR APPROACH;  Surgeon: Kathryne Hitch, MD;  Location: [facility] OR;  Service: Orthopedics;  Laterality: Right;    Allergies  Allergen Reactions  . No Known Allergies      Outpatient Medications Prior to Visit  Medication Sig Dispense Refill  . aspirin EC 81 MG tablet Take 1 tablet (81 mg total) by mouth daily. 90 tablet 1  . cetirizine (ZYRTEC) 10 MG tablet Take 1 tablet (10 mg total) by mouth daily. 30 tablet 1  . metFORMIN (GLUCOPHAGE) 500 MG tablet Take 1 tablet (500 mg total) by mouth 2 (two) times daily with a meal. 60 tablet 6  . methocarbamol (ROBAXIN) 500 MG tablet Take 1 tablet (500 mg total) by mouth 2 (two) times daily. 20 tablet 0  . naproxen (NAPROSYN) 500 MG tablet Take 1 tablet (500 mg total) by mouth 2 (two) times daily with a meal. 30 tablet 0  . pravastatin (PRAVACHOL) 20 MG tablet Take 1 tablet (20 mg total) by mouth daily. 90 tablet 1  . hydrochlorothiazide (HYDRODIURIL) 25 MG tablet Take 1 tablet (25 mg total) by mouth daily. 90 tablet 1    No facility-administered medications prior to visit.     ROS Review of Systems  Constitutional: Negative for activity change and appetite change.  HENT: Negative for sinus pressure and sore throat.   Eyes: Negative for visual disturbance.  Respiratory: Negative for cough, chest tightness and shortness of breath.   Cardiovascular: Negative for chest pain and leg swelling.  Gastrointestinal: Negative for abdominal distention, abdominal pain, constipation and diarrhea.  Endocrine: Negative.   Genitourinary: Negative for dysuria.  Musculoskeletal: Negative for joint swelling and myalgias.  Skin: Negative for rash.  Allergic/Immunologic: Negative.   Neurological: Negative for weakness, light-headedness and numbness.  Psychiatric/Behavioral: Negative for dysphoric mood and suicidal ideas.    Objective:  BP (!) 154/96   Pulse 73   Temp 98 F (36.7 C) (Oral)   Ht 6\' 1"  (1.854 m)   Wt (!) 332 lb 12.8 oz (151 kg)   SpO2 98%   BMI 43.91 kg/m   BP/Weight 09/24/2017 09/11/2017 07/03/2017  Systolic BP 154 166 163  Diastolic BP 96 109 90  Wt. (Lbs) 332.8 320 326  BMI 43.91 42.22 43.01      Physical Exam  Constitutional: He is oriented to person, place, and time. He appears well-developed and well-nourished.  Cardiovascular: Normal rate, normal heart sounds and intact distal pulses.  No murmur heard. Pulmonary/Chest: Effort normal and breath sounds normal. He has no wheezes. He  has no rales. He exhibits no tenderness.  Abdominal: Soft. Bowel sounds are normal. He exhibits no distension and no mass. There is no tenderness.  Musculoskeletal: Normal range of motion.  Neurological: He is alert and oriented to person, place, and time.  Skin: Skin is warm and dry.    CMP Latest Ref Rng & Units 06/05/2017 06/22/2016 03/14/2016  Glucose 65 - 99 mg/dL 84 840(R) 754(H)  BUN 6 - 24 mg/dL 7 9 12   Creatinine 0.76 - 1.27 mg/dL 6.06(V) 7.03(E) 0.35  Sodium 134 - 144 mmol/L 140 142 136   Potassium 3.5 - 5.2 mmol/L 4.3 4.5 3.8  Chloride 96 - 106 mmol/L 100 103 102  CO2 20 - 29 mmol/L 26 25 28   Calcium 8.7 - 10.2 mg/dL 9.4 9.8 2.4(E)  Total Protein 6.0 - 8.5 g/dL 7.6 - -  Total Bilirubin 0.0 - 1.2 mg/dL 0.4 - -  Alkaline Phos 39 - 117 IU/L 86 - -  AST 0 - 40 IU/L 21 - -  ALT 0 - 44 IU/L 24 - -    Lipid Panel     Component Value Date/Time   CHOL 179 06/05/2017 1456   TRIG 115 06/05/2017 1456   HDL 35 (L) 06/05/2017 1456   CHOLHDL 5.1 (H) 06/05/2017 1456   CHOLHDL 6.5 07/25/2012 1047   VLDL 16 07/25/2012 1047   LDLCALC 121 (H) 06/05/2017 1456    Lab Results  Component Value Date   HGBA1C 6.4 (H) 06/05/2017     Assessment & Plan:   1. Prediabetes A1c  Of 6.4 Continue Metformin Lifestyle modifications - Amb ref to Medical Nutrition Therapy-MNT  2. Metabolic syndrome Discussed walking and other exercise regimens including joining the [facility] - Amb ref to Medical Nutrition Therapy-MNT  3. Essential hypertension Uncontrolled HCTZ switched to Lisinopril/HCTZ  4. Morbid obesity (HCC) Counseled on weight loss modalties - Amb ref to Medical Nutrition Therapy-MNT  5. Screening for colon cancer - Ambulatory referral to Gastroenterology  6. Screening for prostate cancer The natural history of prostate cancer and ongoing controversy regarding screening and potential treatment outcomes of prostate cancer has been discussed with the patient. The meaning of a false positive PSA and a false negative PSA has been discussed. He indicates understanding of the limitations of this screening test and wishes  to proceed with screening PSA testing. - PSA, total and free   Meds ordered this encounter  Medications  . lisinopril-hydrochlorothiazide (PRINZIDE,ZESTORETIC) 10-12.5 MG tablet    Sig: Take 1 tablet by mouth daily.    Dispense:  90 tablet    Refill:  1    Discontinue hydrochlorothiazide    Follow-up: Return in about 3 months (around 12/24/2017) for follo up of  hypertension.   Hoy Register MD

## 2017-09-24 NOTE — Patient Instructions (Signed)

## 2017-09-25 ENCOUNTER — Ambulatory Visit (INDEPENDENT_AMBULATORY_CARE_PROVIDER_SITE_OTHER): Payer: Medicaid Other

## 2017-09-25 ENCOUNTER — Encounter: Payer: Self-pay | Admitting: Family Medicine

## 2017-09-25 ENCOUNTER — Encounter (INDEPENDENT_AMBULATORY_CARE_PROVIDER_SITE_OTHER): Payer: Self-pay | Admitting: Orthopaedic Surgery

## 2017-09-25 ENCOUNTER — Ambulatory Visit (INDEPENDENT_AMBULATORY_CARE_PROVIDER_SITE_OTHER): Payer: Medicaid Other | Admitting: Orthopaedic Surgery

## 2017-09-25 DIAGNOSIS — Z96641 Presence of right artificial hip joint: Secondary | ICD-10-CM

## 2017-09-25 DIAGNOSIS — M5440 Lumbago with sciatica, unspecified side: Secondary | ICD-10-CM

## 2017-09-25 LAB — PSA, TOTAL AND FREE
PSA, Free Pct: 13.3 %
PSA, Free: 0.12 ng/mL
Prostate Specific Ag, Serum: 0.9 ng/mL (ref 0.0–4.0)

## 2017-09-25 MED ORDER — NABUMETONE 750 MG PO TABS: 750.0000 mg | ORAL_TABLET | Freq: Two times a day (BID) | ORAL | 1 refills | Status: DC | PRN

## 2017-09-25 MED ORDER — METHYLPREDNISOLONE 4 MG PO TABS: ORAL_TABLET | ORAL | 0 refills | Status: DC

## 2017-09-25 MED FILL — METHYLPREDNISOLONE 4 MG TAB: 4 | 6 days supply | Qty: 21 | Fill #0

## 2017-09-25 NOTE — Progress Notes (Signed)
Office Visit Note   Patient: Brandon Finley           Date of Birth: Oct 08, 1960           MRN: [id number] Visit Date: 09/25/2017              Requested by: Hoy Register, MD [address] [place], [place] [address] PCP: Hoy Register, MD   Assessment & Plan: Visit Diagnoses:  1. History of right hip replacement   2. Acute right-sided low back pain with sciatica, sciatica laterality unspecified     Plan: At this point I do feel that his situation could be improved with significant weight loss.  We will send an outpatient physical therapy and try some anti-inflammatories as well.  All question concerns were answered and addressed.  Again I reiterated the importance of weight loss to treat his morbid obesity which could decrease his pain in his back.  We will see him in 6 weeks as he is doing after course of physical therapy.  Follow-Up Instructions: Return in about 6 weeks (around 11/06/2017).   Orders:  Orders Placed This Encounter  Procedures  . XR HIP UNILAT W OR W/O PELVIS 1V RIGHT   Meds ordered this encounter  Medications  . methylPREDNISolone (MEDROL) 4 MG tablet    Sig: Medrol dose pack. Take as instructed    Dispense:  21 tablet    Refill:  0  . nabumetone (RELAFEN) 750 MG tablet    Sig: Take 1 tablet (750 mg total) by mouth 2 (two) times daily as needed.    Dispense:  60 tablet    Refill:  1      Procedures: No procedures performed   Clinical Data: No additional findings.   Subjective: Chief Complaint  Patient presents with  . Right Hip - Pain  Patient is a 57 year old well-known to me.  He is 18 months status post a right total hip arthroplasty due to debilitating arthritis.  He is someone who is morbidly obese with a BMI of 44.  He recently went to the emergency room due to pain.  He points to the sciatic region in the upper back area and right lumbar spine there is a source of his pain and denies any groin pain.  They put him on a muscle  relaxant and is following up here with [place] now.  He denies any change in bowel bladder function.  He is not a diabetic.  He is interested in weight loss and physical therapy.  HPI  Review of Systems He currently denies any headache, chest pain, shortness of breath, fever, chills, nausea, vomiting.  Objective: Vital Signs: There were no vitals taken for this visit.  Physical Exam Is alert and oriented x3 and in no acute distress Ortho Exam Examination of his right hip shows that moves fluidly with internal and external rotation with no complicating features of this on plain film or physical exam.  There is only just slight lucency to the shoulder of the stem itself but the rest of it does appear bone ingrown and this is not consistent with loosening.  His pain seems to be sciatic related in his lower aspect of the lumbar spine.  He is a significant amount of truncal obesity. Specialty Comments:  No specialty comments available.  Imaging: Xr Hip Unilat W Or W/o Pelvis 1v Right  Result Date: 09/25/2017 An AP pelvis and lateral the right hip shows a total hip arthroplasty with no complicating features.  PMFS History: Patient Active Problem List   Diagnosis Date Noted  . Hypertension 06/22/2016  . 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 Post-traumatic osteoarthritis of right hip 09/14/2015  . Right hip pain 09/14/2015  . Prediabetes 07/25/2012  . Elevated blood pressure 07/25/2012  . Morbid obesity (HCC) 07/25/2012  . Metabolic syndrome 07/25/2012   Past Medical History:  Diagnosis Date  . Arthritis   . Hypertension     Family History  Problem Relation Age of Onset  . Hypertension Father     Past Surgical History:  Procedure Laterality Date  . TOTAL HIP ARTHROPLASTY Right 03/13/2016   Procedure: RIGHT TOTAL HIP ARTHROPLASTY ANTERIOR APPROACH;  Surgeon: Kathryne Hitch, MD;  Location: [facility] OR;  Service: Orthopedics;  Laterality: Right;   Social  History   Occupational History  . Occupation: Company secretary   Tobacco Use  . Smoking status: Former Smoker    Last attempt to quit: 01/16/1995    Years since quitting: 22.7  . Smokeless tobacco: Never Used  Substance and Sexual Activity  . Alcohol use: No  . Drug use: No  . Sexual activity: Never

## 2017-09-27 ENCOUNTER — Telehealth: Payer: Self-pay

## 2017-09-27 ENCOUNTER — Ambulatory Visit: Payer: Medicaid Other | Admitting: Registered"

## 2017-09-27 NOTE — Telephone Encounter (Signed)
-----   Message from Hoy RegisterEnobong Newlin, MD sent at 09/25/2017  7:51 PM EDT ----- Please inform the patient that labs are normal. Thank you.

## 2017-09-27 NOTE — Telephone Encounter (Signed)
Patient was called and informed of lab results via voicemail. 

## 2017-10-08 MED FILL — NABUMETONE 750 MG TABLET: 750 | 30 days supply | Qty: 60 | Fill #0

## 2017-10-15 ENCOUNTER — Ambulatory Visit: Payer: Medicaid Other | Attending: Orthopaedic Surgery

## 2017-10-15 ENCOUNTER — Other Ambulatory Visit: Payer: Self-pay

## 2017-10-15 DIAGNOSIS — M25652 Stiffness of left hip, not elsewhere classified: Secondary | ICD-10-CM | POA: Insufficient documentation

## 2017-10-15 DIAGNOSIS — M25651 Stiffness of right hip, not elsewhere classified: Secondary | ICD-10-CM | POA: Insufficient documentation

## 2017-10-15 DIAGNOSIS — R262 Difficulty in walking, not elsewhere classified: Secondary | ICD-10-CM | POA: Diagnosis present

## 2017-10-15 DIAGNOSIS — G8929 Other chronic pain: Secondary | ICD-10-CM | POA: Diagnosis present

## 2017-10-15 DIAGNOSIS — M5441 Lumbago with sciatica, right side: Secondary | ICD-10-CM | POA: Diagnosis present

## 2017-10-15 DIAGNOSIS — M6281 Muscle weakness (generalized): Secondary | ICD-10-CM | POA: Insufficient documentation

## 2017-10-15 DIAGNOSIS — R293 Abnormal posture: Secondary | ICD-10-CM | POA: Diagnosis present

## 2017-10-15 NOTE — Therapy (Addendum)
[facility] [address] [place], [place], [address] Phone: [phone]   Fax:  [phone]  Physical Therapy Evaluation  Patient Details  Name: Brandon Finley MRN: [id number] Date of Birth: May 05, 1960 Referring Provider (PT): Doneen Poisson, MD   Encounter Date: 10/15/2017  PT End of Session - 10/15/17 1136    Visit Number  1    Number of Visits  12    Date for PT Re-Evaluation  11/22/17    Authorization Type  MCD    PT Start Time  1145    PT Stop Time  1230    PT Time Calculation (min)  45 min    Activity Tolerance  Patient tolerated treatment well    Behavior During Therapy  [facility] for tasks assessed/performed       Past Medical History:  Diagnosis Date  . Arthritis   . Hypertension     Past Surgical History:  Procedure Laterality Date  . TOTAL HIP ARTHROPLASTY Right 03/13/2016   Procedure: RIGHT TOTAL HIP ARTHROPLASTY ANTERIOR APPROACH;  Surgeon: Kathryne Hitch, MD;  Location: [facility] OR;  Service: Orthopedics;  Laterality: Right;    There were no vitals filed for this visit.   Subjective Assessment - 10/15/17 1150    Subjective  He reports Rt hip THA continues to walk with limp.  Occasional pain RT flank .  Pain with waking in AM, sometimes with walking.    Limitations  Walking;Standing;House hold activities    How long can you sit comfortably?  as needed    How long can you stand comfortably?  with decr weight to RT    How long can you walk comfortably?  1/2 mile max limited by SOB    Patient Stated Goals  Decr weight and walk better    Currently in Pain?  No/denies    Pain Score  6    in past week    Pain Location  Flank    Pain Orientation  Right    Pain Descriptors / Indicators  Tightness    Pain Type  Chronic pain    Pain Onset  More than a month ago    Pain Frequency  Intermittent    Aggravating Factors   AM , walking    Pain Relieving Factors  sit , rest         [facility] PT Assessment  - 10/15/17 0001      Assessment   Medical Diagnosis  Sciatica RT    Referring Provider (PT)  Doneen Poisson, MD    Onset Date/Surgical Date  --   03/2017   Next MD Visit  4-6 weeks    Prior Therapy  post THA , 2 months at home      Precautions   Precautions  None      Restrictions   Weight Bearing Restrictions  No      Balance Screen   Has the patient fallen in the past 6 months  No      Home Environment   Living Environment  Private residence    Living Arrangements  Alone    Type of Home  Apartment    Home Access  Stairs to enter    Entrance Stairs-Number of Steps  1    Entrance Stairs-Rails  None    Home Layout  One level      Prior Function   Level of Independence  Needs assistance with homemaking   dauhgter assists    Vocation  Unemployed      Cognition   Overall Cognitive Status  Within Functional Limits for tasks assessed      ROM / Strength   AROM / PROM / Strength  AROM;Strength;PROM      AROM   AROM Assessment Site  Lumbar    Lumbar Flexion  85    Lumbar Extension  -10    Lumbar - Right Side Bend  10    Lumbar - Left Side Bend  10      PROM   Overall PROM Comments  -10 degrees Lt hip extensionn  and knee flexion contracture and RT hip -20 degree extension and -10 degrees knee flexion contracture.        Strength   Overall Strength Comments  ankles and knees and hips 4+/5 to  5/5        Flexibility   Soft Tissue Assessment /Muscle Length  yes    Hamstrings  SLR 55 degrees bilaterally and + Ober's test bilaterally.       Ambulation/Gait   Gait Comments  Walks de cr weight on RT leg.                 Objective measurements completed on examination: See above findings.      [facility] Adult PT Treatment/Exercise - 10/15/17 0001      Exercises   Exercises  Lumbar;Knee/Hip      Lumbar Exercises: Supine   Pelvic Tilt  5 seconds;10 reps      Knee/Hip Exercises: Stretches   Hip Flexor Stretch  Right;Left;5 reps    Hip Flexor Stretch  Limitations  one leg flexed othe rleg slide extended             PT Education - 10/15/17 1234    Education Details  POC, HEP. possibel causes of pain and difficulty walking    Person(s) Educated  Patient    Methods  Explanation;Tactile cues;Verbal cues;Handout    Comprehension  Returned demonstration;Verbalized understanding       PT Short Term Goals - 10/15/17 1145      PT SHORT TERM GOAL #1   Title  He will be independent with initial HEp    Baseline  no program    Time  2    Period  Weeks    Status  New      PT SHORT TERM GOAL #2   Title  He will demo understanding of good sitting posture    Baseline  slumped sitting    Time  2    Period  Weeks    Status  New      PT SHORT TERM GOAL #3   Title  He  in back will report 10% decr pain    Baseline  pain  6/10 at eval with walkiing      Time  2    Period  Weeks        PT Long Term Goals - 10/15/17 1146      PT LONG TERM GOAL #1   Title  He will be indpendent with all HEP issued     Baseline  Independent with initial HEP    Time  6    Period  Weeks    Status  New      PT LONG TERM GOAL #2   Title  He will report pain decr 60% or more with activity on feet    Baseline  pain worse with activity on feet    Time  6    Period  Weeks    Status  New      PT LONG TERM GOAL #3   Title  He will report able to walk 1/2 mile with breathing oly factor limiting walkin    Baseline  REports pain and difficulty breathing limiting now    Time  6    Period  Weeks    Status  New      PT LONG TERM GOAL #4   Title  He will report getting out of bed and chairs with 50% decr effort.     Baseline  report difficulty with both and pain in AM getting out of bed    Time  6    Period  Weeks    Status  New      PT LONG TERM GOAL #5   Title  His hip extension will be no more than -10 degree bilaterally to ease pain with walking    Baseline  -20 degrees hip flexor contracture    Time  6    Period  Weeks    Status  New       Additional Long Term Goals   Additional Long Term Goals  Yes             Plan - 10/15/17 1137    Clinical Impression Statement  Mr Sand present post THA with ongoing pain and difficulty walking. He has stiffness of lower back and both hips with decr extension and  adduction. His LE are WNL with strength and abdominals  fair.  He has some breathing issues  limiting walking also.    If we can get hip looser in hips and back he may improve walking and decr pain    Clinical Presentation  Stable    Clinical Decision Making  Low    Rehab Potential  Good    Clinical Impairments Affecting Rehab Potential  chronicity and level of stiffness     PT Frequency  --   3 visits    PT Duration  --   for 2 weeks then 2x/week for 4 weeks   PT Treatment/Interventions  Manual techniques;Dry needling;Passive range of motion;Patient/family education;Therapeutic activities;Therapeutic exercise;Gait training;Moist Heat;Iontophoresis 4mg /ml Dexamethasone    PT Next Visit Plan  REview HEP , Manual for ROM , add to HEP, modlaities if needed    PT Home Exercise Plan  PPT, Heel slides    Consulted and Agree with Plan of Care  Patient       Patient will benefit from skilled therapeutic intervention in order to improve the following deficits and impairments:  Pain, Postural dysfunction, Decreased range of motion, Difficulty walking, Decreased activity tolerance, Decreased strength, Impaired flexibility, Obesity  Visit Diagnosis: Abnormal posture - Plan: PT plan of care cert/re-cert  Stiffness of right hip joint - Plan: PT plan of care cert/re-cert  Stiffness of left hip, not elsewhere classified - Plan: PT plan of care cert/re-cert  Muscle weakness (generalized) - Plan: PT plan of care cert/re-cert  Difficulty in walking, not elsewhere classified - Plan: PT plan of care cert/re-cert  Chronic right-sided low back pain with right-sided sciatica - Plan: PT plan of care cert/re-cert     Problem  List Patient Active Problem List   Diagnosis Date Noted  . Hypertension 06/22/2016  . 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 Post-traumatic osteoarthritis of right hip 09/14/2015  . Right hip pain 09/14/2015  . Prediabetes 07/25/2012  . Elevated blood pressure  07/25/2012  . Morbid obesity (HCC) 07/25/2012  . Metabolic syndrome 07/25/2012    Caprice Red   PT 10/15/2017, 12:51 PM  [facility] [address] [place], [place], [address] Phone: [phone]   Fax:  [phone]  Name: DAYVON DAX MRN: [id number] Date of Birth: [birth year]

## 2017-10-15 NOTE — Patient Instructions (Signed)
PPT and leg slides with QS x 10 with 5-10 sec holds 2x/day

## 2017-10-23 ENCOUNTER — Ambulatory Visit: Payer: Medicaid Other | Admitting: Registered"

## 2017-10-29 ENCOUNTER — Ambulatory Visit: Payer: Medicaid Other

## 2017-10-31 ENCOUNTER — Ambulatory Visit: Payer: Medicaid Other

## 2017-11-04 ENCOUNTER — Ambulatory Visit: Payer: Medicaid Other

## 2017-11-04 ENCOUNTER — Telehealth: Payer: Self-pay | Admitting: Physical Therapy

## 2017-11-04 NOTE — Telephone Encounter (Signed)
Pt answered and said he would call back and hung up

## 2017-11-05 ENCOUNTER — Ambulatory Visit (INDEPENDENT_AMBULATORY_CARE_PROVIDER_SITE_OTHER): Payer: Medicaid Other | Admitting: Orthopaedic Surgery

## 2017-11-07 ENCOUNTER — Telehealth: Payer: Self-pay | Admitting: Physical Therapy

## 2017-11-07 ENCOUNTER — Ambulatory Visit: Payer: Medicaid Other

## 2017-11-07 NOTE — Telephone Encounter (Signed)
Message left for Brandon Finley to call and let us know if he intends to return to PT . Clinic phone number left on message. Called him back and left message that 10/29 he had 11:45 appointment. Asked he call if he is not coming/

## 2017-11-12 ENCOUNTER — Ambulatory Visit: Payer: Medicaid Other

## 2017-11-12 ENCOUNTER — Telehealth: Payer: Self-pay | Admitting: Physical Therapy

## 2017-11-12 NOTE — Telephone Encounter (Signed)
Spoke to Brandon Finley and he reported he was at work and was not aware of today's appointment . He reports  He did not come to appointments earlier due to pain. I informed him he would have to return in next 2 weeks or be discharged. Also I informed him about Medicaid limits and need to get more authorization for more visits. He said he had our number and would call to schedule a  re-auth visit.

## 2017-11-14 ENCOUNTER — Ambulatory Visit: Payer: Medicaid Other

## 2017-11-20 ENCOUNTER — Ambulatory Visit: Payer: Medicaid Other | Admitting: Podiatry

## 2017-11-27 ENCOUNTER — Telehealth: Payer: Self-pay | Admitting: Physical Therapy

## 2017-11-27 ENCOUNTER — Ambulatory Visit: Payer: Medicaid Other | Attending: Orthopaedic Surgery

## 2017-11-27 NOTE — Telephone Encounter (Signed)
Message left for Brandon Finley to call to reschedule . Indicated I would wait a week and if we did not hear from him would discharge and he would need to return to MD for new order

## 2017-12-09 ENCOUNTER — Ambulatory Visit (INDEPENDENT_AMBULATORY_CARE_PROVIDER_SITE_OTHER): Payer: Medicaid Other | Admitting: Physician Assistant

## 2017-12-18 ENCOUNTER — Ambulatory Visit (INDEPENDENT_AMBULATORY_CARE_PROVIDER_SITE_OTHER): Payer: Medicaid Other | Admitting: Physician Assistant

## 2017-12-24 ENCOUNTER — Ambulatory Visit: Payer: Medicaid Other | Admitting: Family Medicine

## 2017-12-26 ENCOUNTER — Encounter (INDEPENDENT_AMBULATORY_CARE_PROVIDER_SITE_OTHER): Payer: Self-pay | Admitting: Physician Assistant

## 2017-12-26 ENCOUNTER — Ambulatory Visit (INDEPENDENT_AMBULATORY_CARE_PROVIDER_SITE_OTHER): Payer: Medicaid Other | Admitting: Physician Assistant

## 2017-12-26 DIAGNOSIS — Z96641 Presence of right artificial hip joint: Secondary | ICD-10-CM

## 2017-12-26 DIAGNOSIS — M5440 Lumbago with sciatica, unspecified side: Secondary | ICD-10-CM | POA: Diagnosis not present

## 2017-12-26 NOTE — Progress Notes (Signed)
Office Visit Note   Patient: Brandon Finley           Date of Birth: 07-14-1960           MRN: [id number] Visit Date: 12/26/2017              Requested by: Hoy RegisterNewlin, Enobong, MD [address] [place], [place] [id number] PCP: Hoy RegisterNewlin, Enobong, MD   Assessment & Plan: Visit Diagnoses:  1. History of right hip replacement   2. Acute right-sided low back pain with sciatica, sciatica laterality unspecified     Plan: We will have him continue to work on weight loss, stretching, and core strengthening.  He will follow-up with us if he has any hip pain or if his low back pain with radicular symptoms returns.  Questions encouraged and answered at length.  Follow-Up Instructions: Return if symptoms worsen or fail to improve.   Orders:  No orders of the defined types were placed in this encounter.  No orders of the defined types were placed in this encounter.     Procedures: No procedures performed   Clinical Data: No additional findings.   Subjective: Chief Complaint  Patient presents with  . Right Hip - Follow-up    HPI Mr. Brandon Finley returns today 3 months follow-up for low back pain with radicular symptoms.  He states therapy is definitely helped with his.  He is also 8428-month status post right total hip arthroplasty.  He denies any left hip pain.  States that he is having no right hip pain.  He is working on range of motion core strengthening and stretching with physical therapy however has not been in a few weeks.  He is planning on joining a gym soon to work on weight loss and overall conditioning. Review of Systems See HPI  Objective: Vital Signs: Height 6 foot 1 Weight 340 pounds BMI is 44.9 Physical Exam Constitutional:      Appearance: He is obese. He is not ill-appearing or diaphoretic.  Pulmonary:     Effort: Pulmonary effort is normal.  Neurological:     Mental Status: He is alert.     Ortho Exam Bilateral hips good range of motion without pain.   Tight hamstrings bilaterally negative straight leg raise bilaterally.  He has difficulty with forward flexion lumbar spine comes within a couple inches of touching his toes.  Good extension lumbar spine without significant pain. Specialty Comments:  No specialty comments available.  Imaging: No results found.   PMFS History: Patient Active Problem List   Diagnosis Date Noted  . Hypertension 06/22/2016  . 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 Post-traumatic osteoarthritis of right hip 09/14/2015  . Right hip pain 09/14/2015  . Prediabetes 07/25/2012  . Elevated blood pressure 07/25/2012  . Morbid obesity (HCC) 07/25/2012  . Metabolic syndrome 07/25/2012   Past Medical History:  Diagnosis Date  . Arthritis   . Hypertension     Family History  Problem Relation Age of Onset  . Hypertension Father     Past Surgical History:  Procedure Laterality Date  . TOTAL HIP ARTHROPLASTY Right 03/13/2016   Procedure: RIGHT TOTAL HIP ARTHROPLASTY ANTERIOR APPROACH;  Surgeon: Kathryne Hitchhristopher Y Blackman, MD;  [facility];  Service: Orthopedics;  Laterality: Right;   Social History   Occupational History  . Occupation: Company secretaryWarehouse Worker   Tobacco Use  . Smoking status: Former Smoker    Last attempt to quit: 01/16/1995    Years since quitting:  22.9  . Smokeless tobacco: Never Used  Substance and Sexual Activity  . Alcohol use: No  . Drug use: No  . Sexual activity: Never

## 2018-01-13 ENCOUNTER — Ambulatory Visit: Payer: Medicaid Other | Attending: Family Medicine | Admitting: Family Medicine

## 2018-01-13 ENCOUNTER — Encounter: Payer: Self-pay | Admitting: Family Medicine

## 2018-01-13 VITALS — BP 150/82 | HR 76 | Temp 97.7°F | Ht 73.0 in | Wt 341.2 lb

## 2018-01-13 DIAGNOSIS — Z6841 Body Mass Index (BMI) 40.0 and over, adult: Secondary | ICD-10-CM | POA: Insufficient documentation

## 2018-01-13 DIAGNOSIS — M1990 Unspecified osteoarthritis, unspecified site: Secondary | ICD-10-CM | POA: Diagnosis not present

## 2018-01-13 DIAGNOSIS — Z7984 Long term (current) use of oral hypoglycemic drugs: Secondary | ICD-10-CM | POA: Diagnosis not present

## 2018-01-13 DIAGNOSIS — Z7982 Long term (current) use of aspirin: Secondary | ICD-10-CM | POA: Insufficient documentation

## 2018-01-13 DIAGNOSIS — R7303 Prediabetes: Secondary | ICD-10-CM | POA: Diagnosis not present

## 2018-01-13 DIAGNOSIS — Z791 Long term (current) use of non-steroidal anti-inflammatories (NSAID): Secondary | ICD-10-CM | POA: Insufficient documentation

## 2018-01-13 DIAGNOSIS — Z79899 Other long term (current) drug therapy: Secondary | ICD-10-CM | POA: Diagnosis not present

## 2018-01-13 DIAGNOSIS — I10 Essential (primary) hypertension: Secondary | ICD-10-CM | POA: Insufficient documentation

## 2018-01-13 DIAGNOSIS — E8881 Metabolic syndrome: Secondary | ICD-10-CM | POA: Diagnosis not present

## 2018-01-13 LAB — GLUCOSE, POCT (MANUAL RESULT ENTRY): POC GLUCOSE: 115 mg/dL — AB (ref 70–99)

## 2018-01-13 MED ORDER — METFORMIN HCL 500 MG PO TABS: 500.0000 mg | ORAL_TABLET | Freq: Two times a day (BID) | ORAL | 0 refills | Status: DC

## 2018-01-13 MED ORDER — PRAVASTATIN SODIUM 20 MG PO TABS: 20.0000 mg | ORAL_TABLET | Freq: Every day | ORAL | 0 refills | Status: DC

## 2018-01-13 NOTE — Progress Notes (Signed)
Subjective:  Patient ID: Brandon MunroeGerald M Finley, male    DOB: Dec 17, 1960  Age: 57 y.o. MRN: [id number]  CC: Diabetes   HPI Brandon MunroeGerald M Swingler is a 57 year old male with a history of hypertension, metabolic syndrome, obesity, right total hip arthroplasty in 02/2016 who presents today for a follow-up visit. He will be switching to a different PCP as mandated by his insurance company next year and informs me he plans on joining a gym.  He currently does not exercise and has not been cognizant of his eating habits.  He is unhappy he has gained so much weight and he attributes this to being sedentary after his right hip arthroplasty last year. His blood pressure is elevated and he is yet to take his antihypertensive today. With regards to his right hip he reports doing well with no pain at this time.  I had referred [facility] to [facility] and his appointment was on 12/17/2017 however he missed this appointment and states he was unaware even though his chart reveals patient was made aware.  I have provided him with the number to call GI so he can reschedule.  Past Medical History:  Diagnosis Date  . Arthritis   . Hypertension     Past Surgical History:  Procedure Laterality Date  . TOTAL HIP ARTHROPLASTY Right 03/13/2016   Procedure: RIGHT TOTAL HIP ARTHROPLASTY ANTERIOR APPROACH;  Surgeon: Kathryne Hitchhristopher Y Blackman, MD;  [facility];  Service: Orthopedics;  Laterality: Right;    Allergies  Allergen Reactions  . No Known Allergies      Outpatient Medications Prior to Visit  Medication Sig Dispense Refill  . aspirin EC 81 MG tablet Take 1 tablet (81 mg total) by mouth daily. 90 tablet 1  . cetirizine (ZYRTEC) 10 MG tablet Take 1 tablet (10 mg total) by mouth daily. 30 tablet 1  . lisinopril-hydrochlorothiazide (PRINZIDE,ZESTORETIC) 10-12.5 MG tablet Take 1 tablet by mouth daily. 90 tablet 1  . methocarbamol (ROBAXIN) 500 MG tablet Take 1 tablet (500 mg total) by mouth 2 (two) times  daily. 20 tablet 0  . nabumetone (RELAFEN) 750 MG tablet Take 1 tablet (750 mg total) by mouth 2 (two) times daily as needed. 60 tablet 1  . naproxen (NAPROSYN) 500 MG tablet Take 1 tablet (500 mg total) by mouth 2 (two) times daily with a meal. 30 tablet 0  . metFORMIN (GLUCOPHAGE) 500 MG tablet Take 1 tablet (500 mg total) by mouth 2 (two) times daily with a meal. 60 tablet 6  . pravastatin (PRAVACHOL) 20 MG tablet Take 1 tablet (20 mg total) by mouth daily. 90 tablet 1  . methylPREDNISolone (MEDROL) 4 MG tablet Medrol dose pack. Take as instructed (Patient not taking: Reported on 01/13/2018) 21 tablet 0   No facility-administered medications prior to visit.     ROS Review of Systems  Constitutional: Negative for activity change and appetite change.  HENT: Negative for sinus pressure and sore throat.   Eyes: Negative for visual disturbance.  Respiratory: Negative for cough, chest tightness and shortness of breath.   Cardiovascular: Negative for chest pain and leg swelling.  Gastrointestinal: Negative for abdominal distention, abdominal pain, constipation and diarrhea.  Endocrine: Negative.   Genitourinary: Negative for dysuria.  Musculoskeletal: Negative for joint swelling and myalgias.  Skin: Negative for rash.  Allergic/Immunologic: Negative.   Neurological: Negative for weakness, light-headedness and numbness.  Psychiatric/Behavioral: Negative for dysphoric mood and suicidal ideas.    Objective:  BP (!) 150/82  Pulse 76   Temp 97.7 F (36.5 C) (Oral)   Ht 6\' 1"  (1.854 m)   Wt (!) 341 lb 3.2 oz (154.8 kg)   SpO2 94%   BMI 45.02 kg/m   BP/Weight 01/13/2018 09/24/2017 09/11/2017  Systolic BP 150 154 166  Diastolic BP 82 96 109  Wt. (Lbs) 341.2 332.8 320  BMI 45.02 43.91 42.22      Physical Exam Constitutional:      Appearance: He is well-developed.     Comments: Morbidly obese  Cardiovascular:     Rate and Rhythm: Normal rate.     Heart sounds: Normal heart  sounds. No murmur.  Pulmonary:     Effort: Pulmonary effort is normal.     Breath sounds: Normal breath sounds. No wheezing or rales.  Chest:     Chest wall: No tenderness.  Abdominal:     General: Bowel sounds are normal. There is no distension.     Palpations: Abdomen is soft. There is no mass.     Tenderness: There is no abdominal tenderness.  Musculoskeletal: Normal range of motion.  Neurological:     Mental Status: He is alert and oriented to person, place, and time.     Lab Results  Component Value Date   HGBA1C 6.4 (H) 06/05/2017    Assessment & Plan:   1. Prediabetes Controlled with A1c of 6.4 Lifestyle modifications, diabetic diet, exercise Continue metformin - POCT glucose (manual entry) - Hemoglobin A1c  2. Metabolic syndrome See #1 above - pravastatin (PRAVACHOL) 20 MG tablet; Take 1 tablet (20 mg total) by mouth daily.  Dispense: 90 tablet; Refill: 0  3. Essential hypertension Uncontrolled-yet to take antihypertensives today Counseled on blood pressure goal of less than 130/80, low-sodium, DASH diet, medication compliance, 150 minutes of moderate intensity exercise per week. Discussed medication compliance, adverse effects.  4. Morbid obesity (HCC) He plans on joining a gym next year Reduce portion sizes, increase physical activity   Meds ordered this encounter  Medications  . pravastatin (PRAVACHOL) 20 MG tablet    Sig: Take 1 tablet (20 mg total) by mouth daily.    Dispense:  90 tablet    Refill:  0  . metFORMIN (GLUCOPHAGE) 500 MG tablet    Sig: Take 1 tablet (500 mg total) by mouth 2 (two) times daily with a meal.    Dispense:  180 tablet    Refill:  0    Follow-up: Return for Follow-up of chronic medical conditions, he will be scheduling an appointment with a new PCP.   Hoy RegisterEnobong Francisco Ostrovsky MD

## 2018-01-13 NOTE — Patient Instructions (Signed)
[facility] Ph# [phone] [address] . Call to make an appointment as you missed the one scheduled for 12/17/17

## 2018-01-14 ENCOUNTER — Other Ambulatory Visit: Payer: Self-pay | Admitting: Family Medicine

## 2018-01-14 ENCOUNTER — Telehealth: Payer: Self-pay

## 2018-01-14 LAB — HEMOGLOBIN A1C
Est. average glucose Bld gHb Est-mCnc: 180 mg/dL
Hgb A1c MFr Bld: 7.9 % — ABNORMAL HIGH (ref 4.8–5.6)

## 2018-01-14 MED ORDER — METFORMIN HCL 500 MG PO TABS: 1000.0000 mg | ORAL_TABLET | Freq: Two times a day (BID) | ORAL | 0 refills | Status: DC

## 2018-01-14 MED FILL — metFORMIN HCL 500 MG TABS: 500 | 30 days supply | Qty: 120 | Fill #0

## 2018-01-14 MED FILL — PRAVASTATIN SODIUM 20 MG TA: 20 | 30 days supply | Qty: 30 | Fill #0

## 2018-01-14 NOTE — Telephone Encounter (Signed)
-----   Message from Hoy RegisterEnobong Newlin, MD sent at 01/14/2018  9:07 AM EST ----- His A1c is 7.9 which is up from 6.4 previously which means he has moved from the Pre Diabetic range to Diabetes. I have sent a new script for an increased dose of Metformin and will need him to work on a diabetic diet and lifestyle modifications.

## 2018-01-14 NOTE — Telephone Encounter (Signed)
Patient was called and informed of lab results. 

## 2018-01-18 IMAGING — CR DG PORTABLE PELVIS
2 series · 2 of 2 positions shown · non-contrast
Comparison: Pelvis and right hip films of 08/19/2015

CLINICAL DATA: Right total hip replacement

EXAM:
PORTABLE PELVIS 1-2 VIEWS

[AP (1 of 2)]
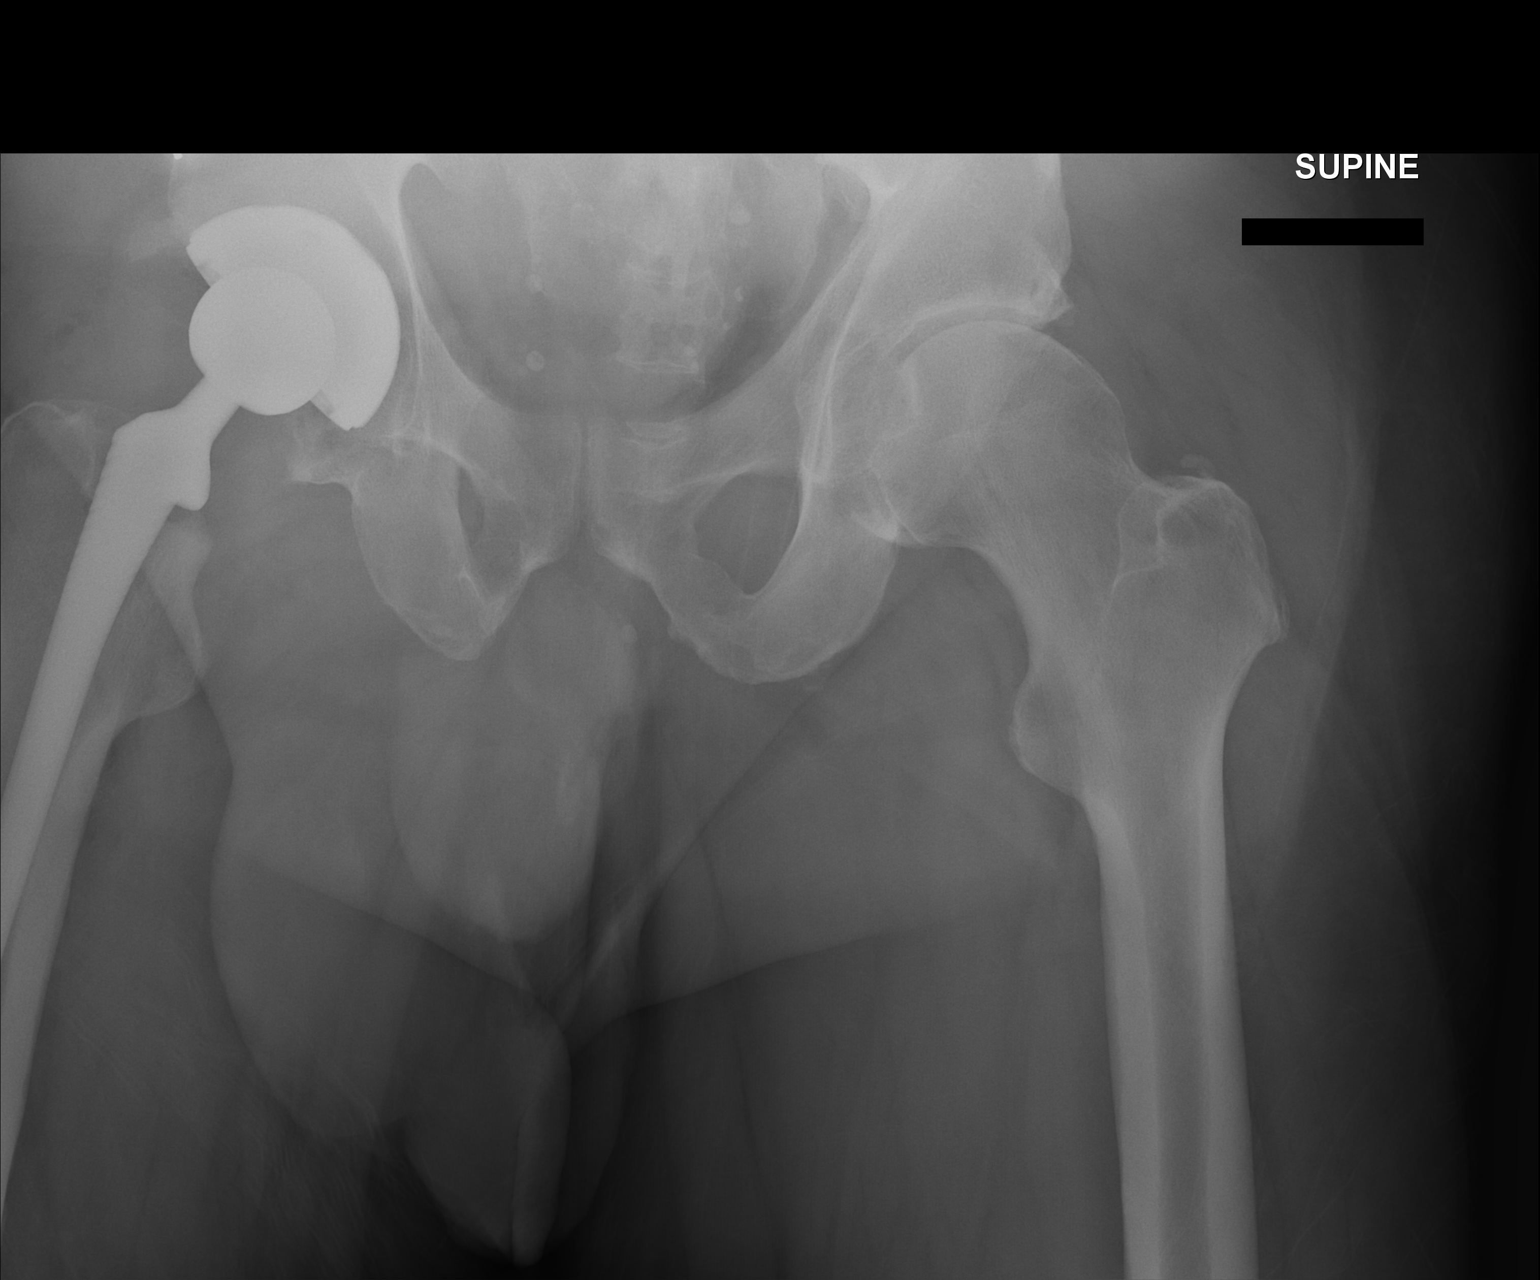

[AP (2 of 2)]
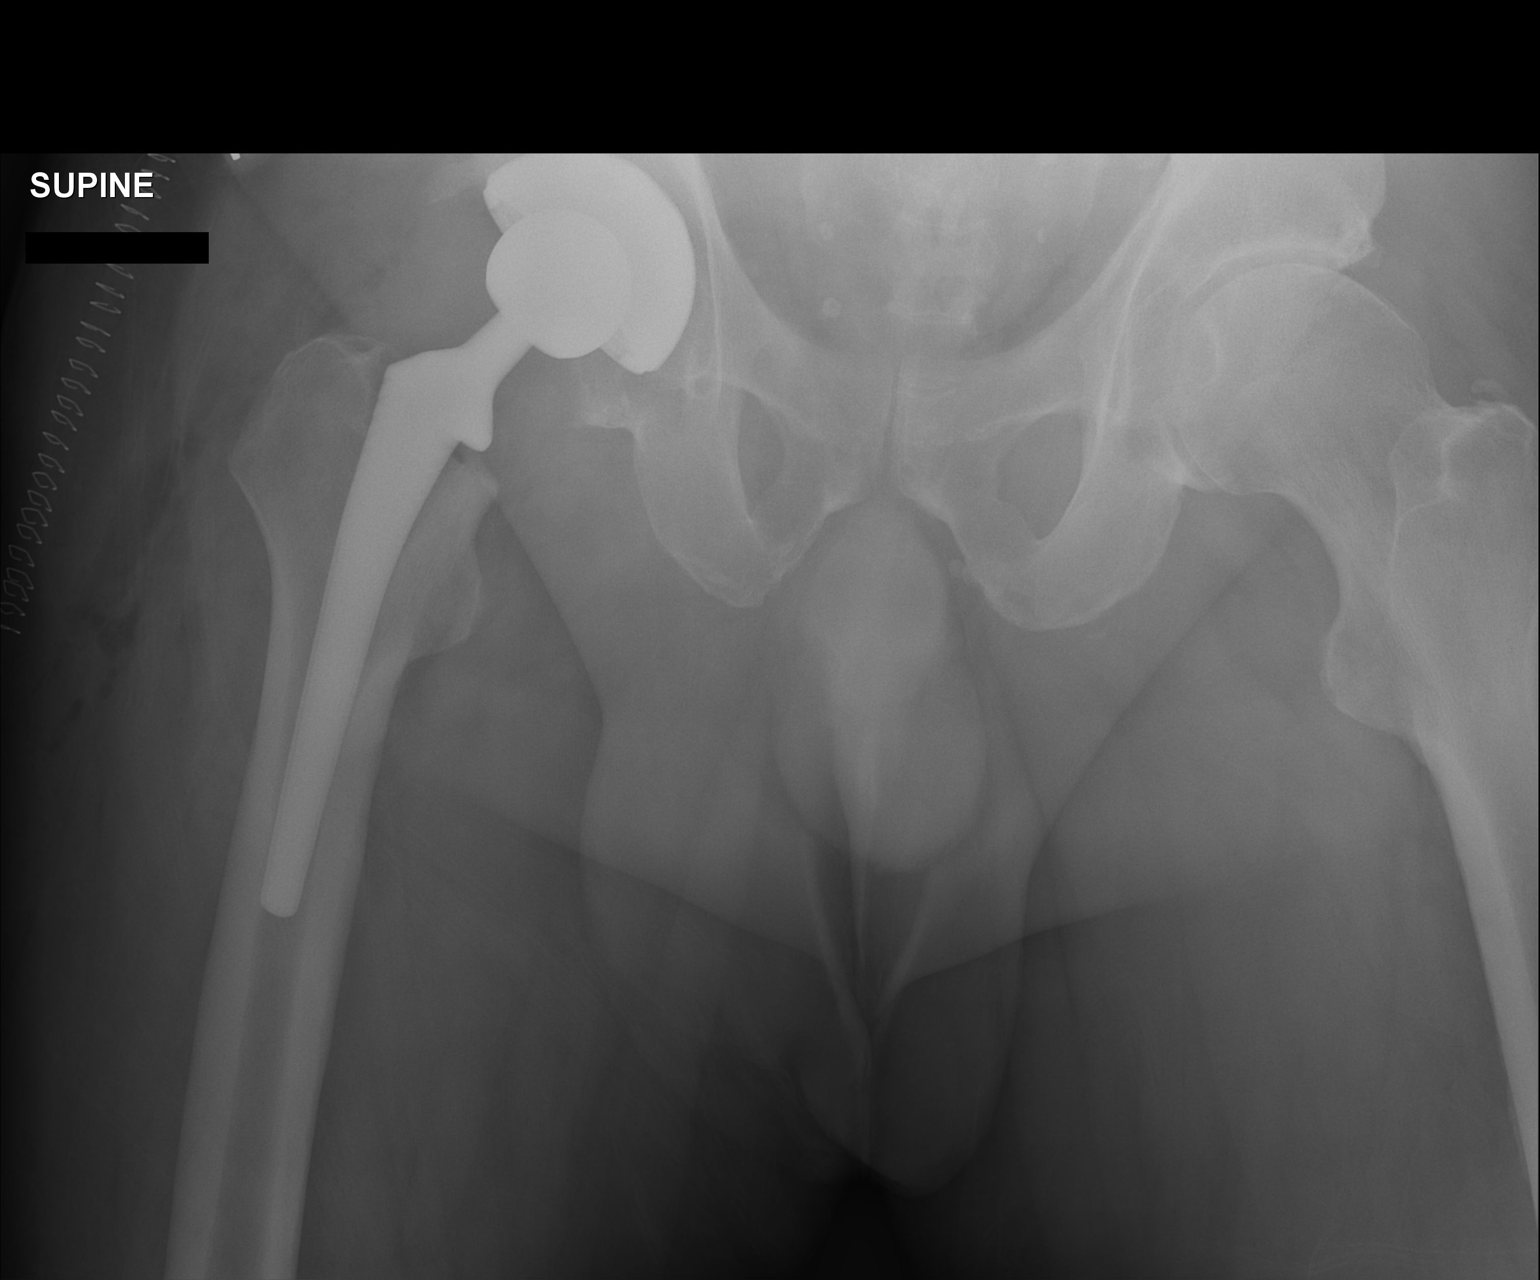

[2 of 2 positions shown; findings below may reference images not displayed]

FINDINGS: The acetabular and femoral components of the right total hip
replacement appear to be in good position. No complicating features
are seen. Moderate degenerative joint disease of the left hip is
noted.
IMPRESSION: Right total hip replacement components in good position with no
complicating features.

## 2018-01-18 IMAGING — RF DG HIP (WITH PELVIS) OPERATIVE*R*
1 series · 3 of 3 positions shown · non-contrast
Comparison: None.

CLINICAL DATA: Right hip replacement

EXAM:
OPERATIVE RIGHT HIP WITH PELVIS; DG C-ARM 61-120 MIN

[Series 1: run · 3 of 3 slices shown]
[im 1/3]
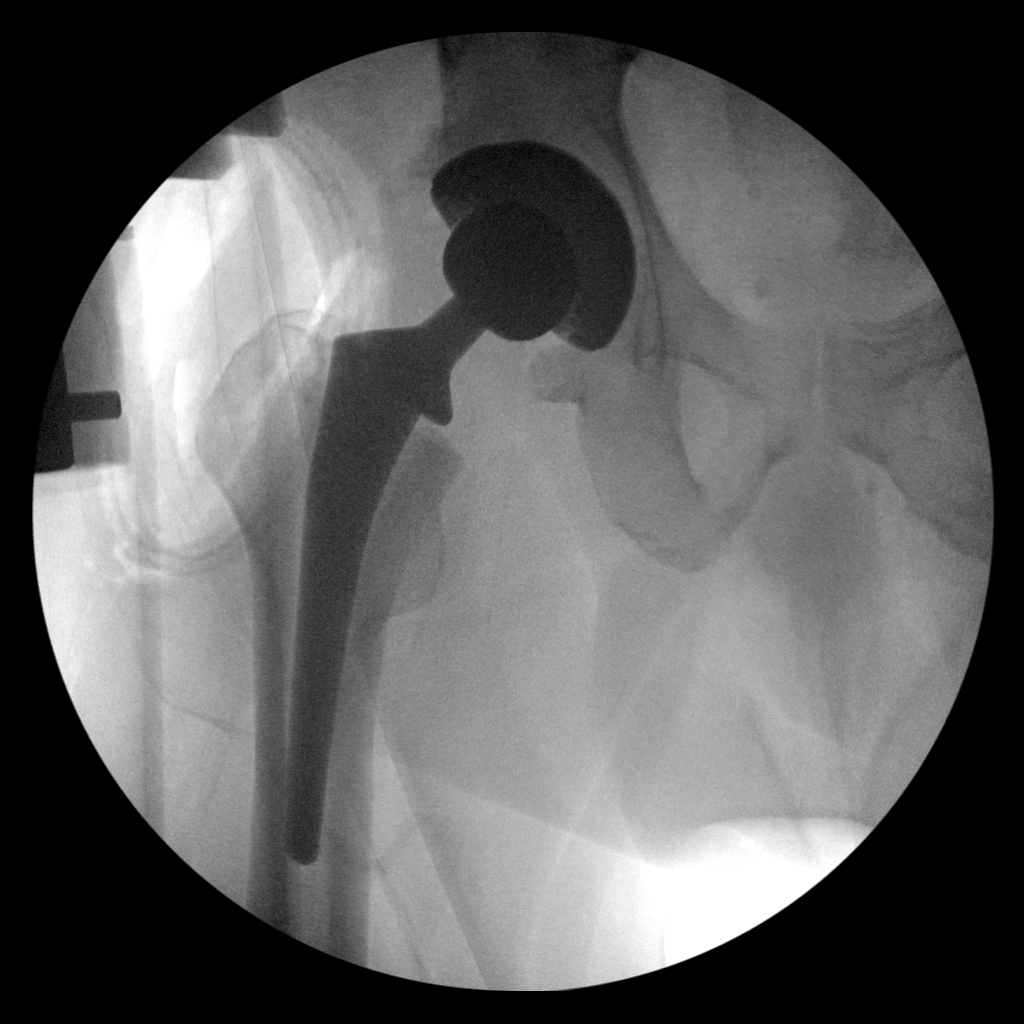
[im 2/3]
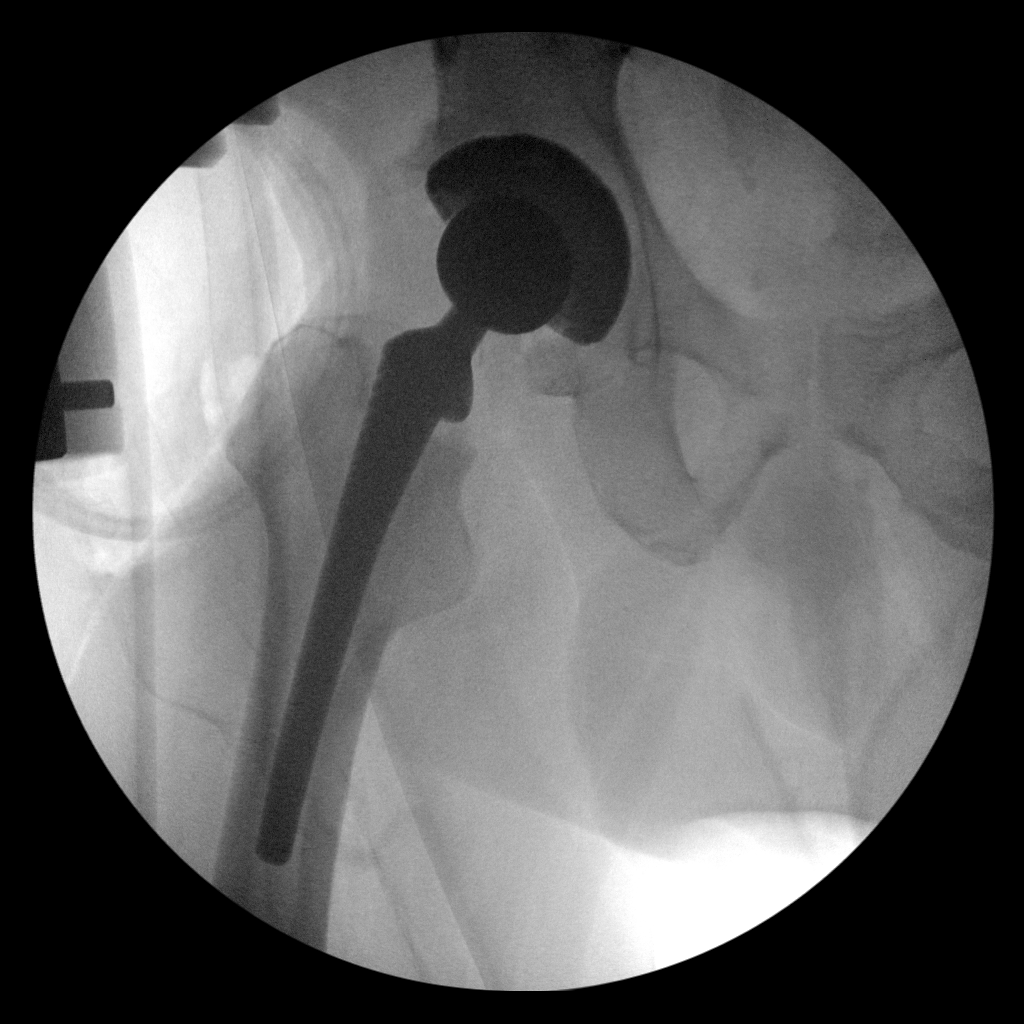
[im 3/3]
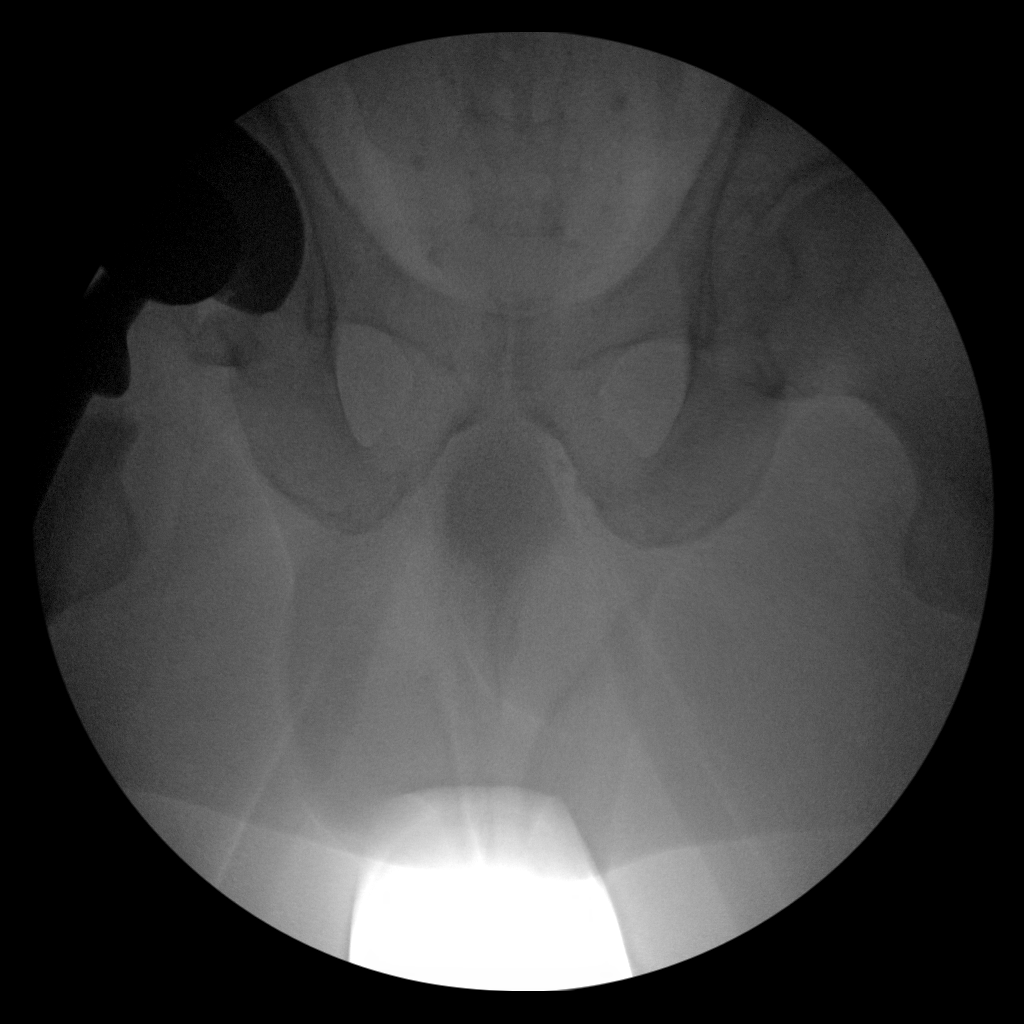

[3 of 3 positions shown; findings below may reference images not displayed]

FLUOROSCOPY TIME:  Fluoroscopy Time:  1 minutes 3 seconds

Radiation Exposure Index (if provided by the fluoroscopic device):
Not available

Number of Acquired Spot Images: 3
FINDINGS: Right hip replacement is noted in satisfactory position. No acute
bony or soft tissue abnormality is noted.
IMPRESSION: Status post right hip replacement

## 2018-01-27 MED FILL — metFORMIN HCL 500 MG TABS: 500 | 30 days supply | Qty: 120 | Fill #0

## 2018-01-27 MED FILL — PRAVASTATIN SODIUM 20 MG TA: 20 | 30 days supply | Qty: 30 | Fill #0

## 2018-02-06 ENCOUNTER — Ambulatory Visit: Payer: Medicaid Other | Admitting: Family Medicine

## 2018-02-24 ENCOUNTER — Ambulatory Visit: Payer: Medicare Other | Attending: Family Medicine | Admitting: Family Medicine

## 2018-02-24 ENCOUNTER — Encounter: Payer: Self-pay | Admitting: Family Medicine

## 2018-02-24 VITALS — BP 135/75 | HR 99 | Temp 97.6°F | Ht 73.0 in | Wt 337.8 lb

## 2018-02-24 DIAGNOSIS — I10 Essential (primary) hypertension: Secondary | ICD-10-CM | POA: Diagnosis not present

## 2018-02-24 DIAGNOSIS — E1169 Type 2 diabetes mellitus with other specified complication: Secondary | ICD-10-CM | POA: Insufficient documentation

## 2018-02-24 DIAGNOSIS — Z791 Long term (current) use of non-steroidal anti-inflammatories (NSAID): Secondary | ICD-10-CM | POA: Insufficient documentation

## 2018-02-24 DIAGNOSIS — Z7982 Long term (current) use of aspirin: Secondary | ICD-10-CM | POA: Insufficient documentation

## 2018-02-24 DIAGNOSIS — Z79899 Other long term (current) drug therapy: Secondary | ICD-10-CM | POA: Insufficient documentation

## 2018-02-24 DIAGNOSIS — Z1211 Encounter for screening for malignant neoplasm of colon: Secondary | ICD-10-CM

## 2018-02-24 DIAGNOSIS — Z8249 Family history of ischemic heart disease and other diseases of the circulatory system: Secondary | ICD-10-CM | POA: Insufficient documentation

## 2018-02-24 DIAGNOSIS — Z6841 Body Mass Index (BMI) 40.0 and over, adult: Secondary | ICD-10-CM | POA: Insufficient documentation

## 2018-02-24 DIAGNOSIS — Z96641 Presence of right artificial hip joint: Secondary | ICD-10-CM | POA: Insufficient documentation

## 2018-02-24 DIAGNOSIS — M1990 Unspecified osteoarthritis, unspecified site: Secondary | ICD-10-CM | POA: Insufficient documentation

## 2018-02-24 DIAGNOSIS — Z7984 Long term (current) use of oral hypoglycemic drugs: Secondary | ICD-10-CM | POA: Insufficient documentation

## 2018-02-24 LAB — POCT GLYCOSYLATED HEMOGLOBIN (HGB A1C): HBA1C, POC (CONTROLLED DIABETIC RANGE): 7.9 % — AB (ref 0.0–7.0)

## 2018-02-24 LAB — GLUCOSE, POCT (MANUAL RESULT ENTRY): POC Glucose: 223 mg/dl — AB (ref 70–99)

## 2018-02-24 NOTE — Progress Notes (Signed)
Subjective:  Patient ID: Brandon MunroeGerald M Stretch, male    DOB: 01-18-1960  Age: 58 y.o. MRN: [id number]  CC: Diabetes   HPI Brandon MunroeGerald M Dubreuil is a 58 year old male with a history of hypertension, type 2 diabetes mellitus (A1c 7.9), obesity, right total hip arthroplasty in 02/2016 who presents today requesting a colonoscopy referral. I had referred him to [facility] and he had an appointment 2 months ago which he rescheduled for last month but somehow missed last month's appointment. Provided with information for [facility] so he can call and reschedule his appointment. He is up-to-date on an annual eye exam which he had sometime last month. [facility] [employer] and plans to [facility] regularly.  Past Medical History:  Diagnosis Date  . Arthritis   . Hypertension     Past Surgical History:  Procedure Laterality Date  . TOTAL HIP ARTHROPLASTY Right 03/13/2016   Procedure: RIGHT TOTAL HIP ARTHROPLASTY ANTERIOR APPROACH;  Surgeon: Kathryne Hitchhristopher Y Blackman, MD;  [facility];  Service: Orthopedics;  Laterality: Right;    Family History  Problem Relation Age of Onset  . Hypertension Father     Allergies  Allergen Reactions  . No Known Allergies     Outpatient Medications Prior to Visit  Medication Sig Dispense Refill  . aspirin EC 81 MG tablet Take 1 tablet (81 mg total) by mouth daily. 90 tablet 1  . cetirizine (ZYRTEC) 10 MG tablet Take 1 tablet (10 mg total) by mouth daily. 30 tablet 1  . lisinopril-hydrochlorothiazide (PRINZIDE,ZESTORETIC) 10-12.5 MG tablet Take 1 tablet by mouth daily. 90 tablet 1  . metFORMIN (GLUCOPHAGE) 500 MG tablet Take 2 tablets (1,000 mg total) by mouth 2 (two) times daily with a meal. 360 tablet 0  . methocarbamol (ROBAXIN) 500 MG tablet Take 1 tablet (500 mg total) by mouth 2 (two) times daily. 20 tablet 0  . nabumetone (RELAFEN) 750 MG tablet Take 1 tablet (750 mg total) by mouth 2 (two) times daily as needed. 60 tablet 1  . naproxen (NAPROSYN) 500 MG  tablet Take 1 tablet (500 mg total) by mouth 2 (two) times daily with a meal. 30 tablet 0  . pravastatin (PRAVACHOL) 20 MG tablet Take 1 tablet (20 mg total) by mouth daily. 90 tablet 0  . methylPREDNISolone (MEDROL) 4 MG tablet Medrol dose pack. Take as instructed (Patient not taking: Reported on 01/13/2018) 21 tablet 0   No facility-administered medications prior to visit.      ROS Review of Systems General: negative for fever, weight loss, appetite change Eyes: no visual symptoms. ENT: no ear symptoms, no sinus tenderness, no nasal congestion or sore throat. Neck: no pain  Respiratory: no wheezing, shortness of breath, cough Cardiovascular: no chest pain, no dyspnea on exertion, no pedal edema, no orthopnea. Gastrointestinal: no abdominal pain, no diarrhea, no constipation Genito-Urinary: no urinary frequency, no dysuria, no polyuria. Hematologic: no bruising Endocrine: no cold or heat intolerance Neurological: no headaches, no seizures, no tremors Musculoskeletal: no joint pains, no joint swelling Skin: no pruritus, no rash. Psychological: no depression, no anxiety,    Objective:  BP 135/75   Pulse 99   Temp 97.6 F (36.4 C) (Oral)   Ht 6\' 1"  (1.854 m)   Wt (!) 337 lb 12.8 oz (153.2 kg)   SpO2 94%   BMI 44.57 kg/m   BP/Weight 02/24/2018 01/13/2018 09/24/2017  Systolic BP 135 150 154  Diastolic BP 75 82 96  Wt. (Lbs) 337.8 341.2 332.8  BMI 44.57 45.02 43.91      Physical Exam Constitutional: Morbidly obese Eyes: PERRLA HEENT: Head is atraumatic, normal sinuses, normal oropharynx, normal appearing tonsils and palate, tympanic membrane is normal bilaterally. Neck: normal range of motion, no thyromegaly, no JVD Cardiovascular: normal rate and rhythm, normal heart sounds, no murmurs, rub or gallop, no pedal edema Respiratory: Normal breath sounds, clear to auscultation bilaterally, no wheezes, no rales, no rhonchi Abdomen: Distended, soft, not tender to palpation,  normal bowel sounds, no enlarged organs Musculoskeletal: Full ROM, no tenderness in joints Skin: warm and dry, no lesions. Neurological: alert, oriented x3, cranial nerves I-XII grossly intact , normal motor strength, normal sensation. Psychological: normal mood.   CMP Latest Ref Rng & Units 06/05/2017 06/22/2016 03/14/2016  Glucose 65 - 99 mg/dL 84 103(P) 594(V)  BUN 6 - 24 mg/dL 7 9 12   Creatinine 0.76 - 1.27 mg/dL 8.59(Y) 9.24(M) 6.28  Sodium 134 - 144 mmol/L 140 142 136  Potassium 3.5 - 5.2 mmol/L 4.3 4.5 3.8  Chloride 96 - 106 mmol/L 100 103 102  CO2 20 - 29 mmol/L 26 25 28   Calcium 8.7 - 10.2 mg/dL 9.4 9.8 6.3(O)  Total Protein 6.0 - 8.5 g/dL 7.6 - -  Total Bilirubin 0.0 - 1.2 mg/dL 0.4 - -  Alkaline Phos 39 - 117 IU/L 86 - -  AST 0 - 40 IU/L 21 - -  ALT 0 - 44 IU/L 24 - -    Lipid Panel     Component Value Date/Time   CHOL 179 06/05/2017 1456   TRIG 115 06/05/2017 1456   HDL 35 (L) 06/05/2017 1456   CHOLHDL 5.1 (H) 06/05/2017 1456   CHOLHDL 6.5 07/25/2012 1047   VLDL 16 07/25/2012 1047   LDLCALC 121 (H) 06/05/2017 1456    CBC    Component Value Date/Time   WBC 11.2 (H) 03/14/2016 0756   RBC 3.98 (L) 03/14/2016 0756   HGB 12.6 (L) 03/14/2016 0756   HCT 38.2 (L) 03/14/2016 0756   PLT 212 03/14/2016 0756   MCV 96.0 03/14/2016 0756   MCH 31.7 03/14/2016 0756   MCHC 33.0 03/14/2016 0756   RDW 12.0 03/14/2016 0756    Lab Results  Component Value Date   HGBA1C 7.9 (A) 02/24/2018    Assessment & Plan:   1. Type 2 diabetes mellitus with other specified complication, unspecified whether long term insulin use (HCC) Not at goal with A1c of 7.9 Regimen had been adjusted at his visit last month Continue current management Counseled on Diabetic diet, my plate method, 177 minutes of moderate intensity exercise/week Keep blood sugar logs with fasting goals of 80-120 mg/dl, random of less than 116 and in the event of sugars less than 60 mg/dl or greater than 579 mg/dl  please notify the clinic ASAP. It is recommended that you undergo annual eye exams and annual foot exams. Pneumonia vaccine is recommended. - POCT glucose (manual entry) - POCT glycosylated hemoglobin (Hb A1C)  2. Screening for colon cancer Referred to [facility] and he rescheduled his appointment last month. We have provided him with the number to [facility] to call and reschedule  3. Morbid obesity (HCC) He is currently working on weight loss and has joined the [employer] We provided him with a scale and he will work on reducing portion sizes, increasing physical activity   No orders of the defined types were placed in this encounter.   Follow-up: Return in about 3 months (around 05/25/2018) for Follow-up of  chronic medical conditions.       Hoy RegisterEnobong Chrissy Ealey, MD, FAAFP. [facility] [place], [place] [phone]   02/24/2018, 2:39 PM

## 2018-02-24 NOTE — Progress Notes (Signed)
Referral to gastro for colonoscopy.  

## 2018-02-24 NOTE — Patient Instructions (Addendum)
For Colonoscopy - call [facility] Ph# [phone] #[phone] to reschedule your appointment. 

## 2018-02-27 MED FILL — metFORMIN HCL 500 MG TABS: 500 | 30 days supply | Qty: 120 | Fill #1 | Status: TO

## 2018-02-27 MED FILL — PRAVASTATIN SODIUM 20 MG TA: 20 | 30 days supply | Qty: 30 | Fill #1 | Status: TO

## 2018-03-14 ENCOUNTER — Other Ambulatory Visit: Payer: Self-pay | Admitting: Family Medicine

## 2018-03-14 DIAGNOSIS — E8881 Metabolic syndrome: Secondary | ICD-10-CM

## 2018-03-16 ENCOUNTER — Other Ambulatory Visit (INDEPENDENT_AMBULATORY_CARE_PROVIDER_SITE_OTHER): Payer: Self-pay | Admitting: Orthopaedic Surgery

## 2018-03-28 ENCOUNTER — Telehealth: Payer: Self-pay | Admitting: Family Medicine

## 2018-03-28 NOTE — Telephone Encounter (Signed)
New Message  [place] from [facility] states the pt no showed his colonoscopy screening and she thinks this was the 2nd time. Please f/u

## 2018-03-28 NOTE — Telephone Encounter (Signed)
Will route to PCP for review. 

## 2018-03-31 NOTE — Telephone Encounter (Signed)
Could you please call him to inform him of Thomasena Edis message. He will need to contact them himself for future appointments. Thanks.

## 2018-04-02 ENCOUNTER — Ambulatory Visit: Payer: Medicare Other | Attending: Family Medicine | Admitting: Family Medicine

## 2018-04-02 ENCOUNTER — Encounter: Payer: Self-pay | Admitting: Family Medicine

## 2018-04-02 ENCOUNTER — Other Ambulatory Visit: Payer: Self-pay

## 2018-04-02 VITALS — BP 132/82 | HR 74 | Temp 97.2°F | Ht 73.0 in | Wt 330.6 lb

## 2018-04-02 DIAGNOSIS — E669 Obesity, unspecified: Secondary | ICD-10-CM | POA: Diagnosis not present

## 2018-04-02 DIAGNOSIS — Z6841 Body Mass Index (BMI) 40.0 and over, adult: Secondary | ICD-10-CM

## 2018-04-02 DIAGNOSIS — E1169 Type 2 diabetes mellitus with other specified complication: Secondary | ICD-10-CM | POA: Diagnosis not present

## 2018-04-02 DIAGNOSIS — Z1211 Encounter for screening for malignant neoplasm of colon: Secondary | ICD-10-CM

## 2018-04-02 LAB — GLUCOSE, POCT (MANUAL RESULT ENTRY): POC GLUCOSE: 113 mg/dL — AB (ref 70–99)

## 2018-04-02 NOTE — Patient Instructions (Signed)
For Colonoscopy - call [facility] Ph# [phone] #[phone] to reschedule your appointment.

## 2018-04-02 NOTE — Progress Notes (Signed)
Subjective:  Patient ID: Brandon Finley, male    DOB: April 24, 1960  Age: 58 y.o. MRN: [id number]  CC: Diabetes   HPI Brandon Finley  is a 58 year old male with a history of hypertension, type 2 diabetes mellitus (A1c 7.9), obesity, right total hip arthroplasty in 02/2016 who presents today requesting a colonoscopy referral. At his last visit I had printed out the number for [facility] on his AVS and asked him to call to schedule an appointment. I received a message from [facility] stating he no showed his colonoscopy and he informs me today he was unaware of his appointment. He has no additional concerns today.  Past Medical History:  Diagnosis Date  . Arthritis   . Hypertension     Past Surgical History:  Procedure Laterality Date  . TOTAL HIP ARTHROPLASTY Right 03/13/2016   Procedure: RIGHT TOTAL HIP ARTHROPLASTY ANTERIOR APPROACH;  Surgeon: Kathryne Hitch, MD;  Location: [facility] OR;  Service: Orthopedics;  Laterality: Right;    Family History  Problem Relation Age of Onset  . Hypertension Father     Allergies  Allergen Reactions  . No Known Allergies     Outpatient Medications Prior to Visit  Medication Sig Dispense Refill  . aspirin EC 81 MG tablet Take 1 tablet (81 mg total) by mouth daily. 90 tablet 1  . cetirizine (ZYRTEC) 10 MG tablet Take 1 tablet (10 mg total) by mouth daily. 30 tablet 1  . lisinopril-hydrochlorothiazide (PRINZIDE,ZESTORETIC) 10-12.5 MG tablet Take 1 tablet by mouth daily. 90 tablet 1  . metFORMIN (GLUCOPHAGE) 500 MG tablet Take 2 tablets by mouth twice a day with meals 120 tablet 2  . methocarbamol (ROBAXIN) 500 MG tablet Take 1 tablet (500 mg total) by mouth 2 (two) times daily. 20 tablet 0  . nabumetone (RELAFEN) 750 MG tablet Take 1 tablet by mouth twice daily as needed 60 tablet 11  . naproxen (NAPROSYN) 500 MG tablet Take 1 tablet (500 mg total) by mouth 2 (two) times daily with a meal. 30 tablet 0  . pravastatin (PRAVACHOL) 20 MG tablet  Take 1 tablet by mouth every day 30 tablet 2  . methylPREDNISolone (MEDROL) 4 MG tablet Medrol dose pack. Take as instructed (Patient not taking: Reported on 01/13/2018) 21 tablet 0   No facility-administered medications prior to visit.      ROS Review of Systems  Constitutional: Negative for activity change and appetite change.  HENT: Negative for sinus pressure and sore throat.   Respiratory: Negative for chest tightness, shortness of breath and wheezing.   Cardiovascular: Negative for chest pain and palpitations.  Gastrointestinal: Negative for abdominal distention, abdominal pain and constipation.  Genitourinary: Negative.   Musculoskeletal: Negative.   Psychiatric/Behavioral: Negative for behavioral problems and dysphoric mood.    Objective:  BP 132/82   Pulse 74   Temp (!) 97.2 F (36.2 C) (Oral)   Ht 6\' 1"  (1.854 m)   Wt (!) 330 lb 9.6 oz (150 kg)   SpO2 95%   BMI 43.62 kg/m   BP/Weight 04/02/2018 02/24/2018 01/13/2018  Systolic BP 132 135 150  Diastolic BP 82 75 82  Wt. (Lbs) 330.6 337.8 341.2  BMI 43.62 44.57 45.02      Physical Exam Constitutional:      Appearance: He is well-developed.  Cardiovascular:     Rate and Rhythm: Normal rate.     Heart sounds: Normal heart sounds. No murmur.  Pulmonary:     Effort: Pulmonary  effort is normal.     Breath sounds: Normal breath sounds. No wheezing or rales.  Chest:     Chest wall: No tenderness.  Abdominal:     General: Bowel sounds are normal. There is no distension.     Palpations: Abdomen is soft. There is no mass.     Tenderness: There is no abdominal tenderness.  Musculoskeletal: Normal range of motion.  Neurological:     Mental Status: He is alert and oriented to person, place, and time.     CMP Latest Ref Rng & Units 06/05/2017 06/22/2016 03/14/2016  Glucose 65 - 99 mg/dL 84 786(L) 544(B)  BUN 6 - 24 mg/dL 7 9 12   Creatinine 0.76 - 1.27 mg/dL 2.01(E) 0.71(Q) 1.97  Sodium 134 - 144 mmol/L 140 142 136   Potassium 3.5 - 5.2 mmol/L 4.3 4.5 3.8  Chloride 96 - 106 mmol/L 100 103 102  CO2 20 - 29 mmol/L 26 25 28   Calcium 8.7 - 10.2 mg/dL 9.4 9.8 5.8(I)  Total Protein 6.0 - 8.5 g/dL 7.6 - -  Total Bilirubin 0.0 - 1.2 mg/dL 0.4 - -  Alkaline Phos 39 - 117 IU/L 86 - -  AST 0 - 40 IU/L 21 - -  ALT 0 - 44 IU/L 24 - -    Lipid Panel     Component Value Date/Time   CHOL 179 06/05/2017 1456   TRIG 115 06/05/2017 1456   HDL 35 (L) 06/05/2017 1456   CHOLHDL 5.1 (H) 06/05/2017 1456   CHOLHDL 6.5 07/25/2012 1047   VLDL 16 07/25/2012 1047   LDLCALC 121 (H) 06/05/2017 1456    CBC    Component Value Date/Time   WBC 11.2 (H) 03/14/2016 0756   RBC 3.98 (L) 03/14/2016 0756   HGB 12.6 (L) 03/14/2016 0756   HCT 38.2 (L) 03/14/2016 0756   PLT 212 03/14/2016 0756   MCV 96.0 03/14/2016 0756   MCH 31.7 03/14/2016 0756   MCHC 33.0 03/14/2016 0756   RDW 12.0 03/14/2016 0756    Lab Results  Component Value Date   HGBA1C 7.9 (A) 02/24/2018    Assessment & Plan:   1. Type 2 diabetes mellitus with other specified complication, unspecified whether long term insulin use (HCC) Not fully optimized with A1c of 7.9 Regimen addressed at previous office visit Counseled on Diabetic diet, my plate method, 325 minutes of moderate intensity exercise/week Keep blood sugar logs with fasting goals of 80-120 mg/dl, random of less than 498 and in the event of sugars less than 60 mg/dl or greater than 264 mg/dl please notify the clinic ASAP. It is recommended that you undergo annual eye exams and annual foot exams. Pneumonia vaccine is recommended. - POCT glucose (manual entry)  2. Screening for colon cancer We have provided directions to [facility] and advised him to go forth via right away to reschedule his appointment   No orders of the defined types were placed in this encounter.   Follow-up: Return for Follow-up of chronic medical conditions, keep previously scheduled appointment.       Hoy Register, MD, FAAFP. [facility] [place], [place] [phone]   04/02/2018, 4:12 PM

## 2018-06-10 ENCOUNTER — Other Ambulatory Visit: Payer: Self-pay | Admitting: Family Medicine

## 2018-06-10 DIAGNOSIS — E8881 Metabolic syndrome: Secondary | ICD-10-CM

## 2018-07-10 ENCOUNTER — Other Ambulatory Visit: Payer: Self-pay

## 2018-07-10 ENCOUNTER — Ambulatory Visit: Payer: Medicare Other | Attending: Family Medicine | Admitting: Family Medicine

## 2018-07-10 ENCOUNTER — Encounter: Payer: Self-pay | Admitting: Family Medicine

## 2018-07-10 VITALS — BP 165/95 | HR 80 | Temp 97.6°F | Ht 73.0 in | Wt 335.0 lb

## 2018-07-10 DIAGNOSIS — B3742 Candidal balanitis: Secondary | ICD-10-CM

## 2018-07-10 DIAGNOSIS — E782 Mixed hyperlipidemia: Secondary | ICD-10-CM

## 2018-07-10 DIAGNOSIS — E1165 Type 2 diabetes mellitus with hyperglycemia: Secondary | ICD-10-CM | POA: Diagnosis not present

## 2018-07-10 DIAGNOSIS — I10 Essential (primary) hypertension: Secondary | ICD-10-CM

## 2018-07-10 DIAGNOSIS — E1169 Type 2 diabetes mellitus with other specified complication: Secondary | ICD-10-CM | POA: Diagnosis not present

## 2018-07-10 DIAGNOSIS — E785 Hyperlipidemia, unspecified: Secondary | ICD-10-CM | POA: Insufficient documentation

## 2018-07-10 LAB — POCT GLYCOSYLATED HEMOGLOBIN (HGB A1C): HbA1c, POC (controlled diabetic range): 10 % — AB (ref 0.0–7.0)

## 2018-07-10 LAB — GLUCOSE, POCT (MANUAL RESULT ENTRY): POC Glucose: 254 mg/dl — AB (ref 70–99)

## 2018-07-10 MED ORDER — CLOTRIMAZOLE 1 % EX CREA: 1.0000 "application " | TOPICAL_CREAM | Freq: Two times a day (BID) | CUTANEOUS | 0 refills | Status: DC

## 2018-07-10 MED ORDER — VICTOZA 18 MG/3ML ~~LOC~~ SOPN: PEN_INJECTOR | SUBCUTANEOUS | 3 refills | Status: DC

## 2018-07-10 MED ORDER — LISINOPRIL-HYDROCHLOROTHIAZIDE 10-12.5 MG PO TABS: 1.0000 | ORAL_TABLET | Freq: Every day | ORAL | 1 refills | Status: DC

## 2018-07-10 MED ORDER — ATORVASTATIN CALCIUM 40 MG PO TABS: 40.0000 mg | ORAL_TABLET | Freq: Every day | ORAL | 1 refills | Status: DC

## 2018-07-10 MED ORDER — METFORMIN HCL 500 MG PO TABS: 1000.0000 mg | ORAL_TABLET | Freq: Two times a day (BID) | ORAL | 1 refills | Status: DC

## 2018-07-10 MED FILL — VICTOZA 2-PAK 18 MG/3 ML PE: 18 | 17 days supply | Qty: 3 | Fill #0

## 2018-07-10 MED FILL — ATORVASTATIN CALCIUM 40 MG: 40 | 90 days supply | Qty: 90 | Fill #0

## 2018-07-10 MED FILL — metFORMIN HCL 500 MG TABS: 500 | 90 days supply | Qty: 360 | Fill #0

## 2018-07-10 MED FILL — LISINOPRIL-HCTZ 10-12.5 MG: 10-12.5 | 90 days supply | Qty: 90 | Fill #0

## 2018-07-10 NOTE — Patient Instructions (Addendum)
Calorie Counting for Weight Loss Calories are units of energy. Your body needs a certain amount of calories from food to keep you going throughout the day. When you eat more calories than your body needs, your body stores the extra calories as fat. When you eat fewer calories than your body needs, your body burns fat to get the energy it needs. Calorie counting means keeping track of how many calories you eat and drink each day. Calorie counting can be helpful if you need to lose weight. If you make sure to eat fewer calories than your body needs, you should lose weight. Ask your health care provider what a healthy weight is for you. For calorie counting to work, you will need to eat the right number of calories in a day in order to lose a healthy amount of weight per week. A dietitian can help you determine how many calories you need in a day and will give you suggestions on how to reach your calorie goal.  A healthy amount of weight to lose per week is usually 1-2 lb (0.5-0.9 kg). This usually means that your daily calorie intake should be reduced by 500-750 calories.  Eating 1,200 - 1,500 calories per day can help most women lose weight.  Eating 1,500 - 1,800 calories per day can help most men lose weight. What is my plan? My goal is to have __________ calories per day. If I have this many calories per day, I should lose around __________ pounds per week. What do I need to know about calorie counting? In order to meet your daily calorie goal, you will need to:  Find out how many calories are in each food you would like to eat. Try to do this before you eat.  Decide how much of the food you plan to eat.  Write down what you ate and how many calories it had. Doing this is called keeping a food log. To successfully lose weight, it is important to balance calorie counting with a healthy lifestyle that includes regular activity. Aim for 150 minutes of moderate exercise (such as walking) or 75  minutes of vigorous exercise (such as running) each week. Where do I find calorie information?  The number of calories in a food can be found on a Nutrition Facts label. If a food does not have a Nutrition Facts label, try to look up the calories online or ask your dietitian for help. Remember that calories are listed per serving. If you choose to have more than one serving of a food, you will have to multiply the calories per serving by the amount of servings you plan to eat. For example, the label on a package of bread might say that a serving size is 1 slice and that there are 90 calories in a serving. If you eat 1 slice, you will have eaten 90 calories. If you eat 2 slices, you will have eaten 180 calories. How do I keep a food log? Immediately after each meal, record the following information in your food log:  What you ate. Don't forget to include toppings, sauces, and other extras on the food.  How much you ate. This can be measured in cups, ounces, or number of items.  How many calories each food and drink had.  The total number of calories in the meal. Keep your food log near you, such as in a small notebook in your pocket, or use a mobile app or website. Some programs will calculate   calories for you and show you how many calories you have left for the day to meet your goal. What are some calorie counting tips?   Use your calories on foods and drinks that will fill you up and not leave you hungry: ? Some examples of foods that fill you up are nuts and nut butters, vegetables, lean proteins, and high-fiber foods like whole grains. High-fiber foods are foods with more than 5 g fiber per serving. ? Drinks such as sodas, specialty coffee drinks, alcohol, and juices have a lot of calories, yet do not fill you up.  Eat nutritious foods and avoid empty calories. Empty calories are calories you get from foods or beverages that do not have many vitamins or protein, such as candy, sweets, and  soda. It is better to have a nutritious high-calorie food (such as an avocado) than a food with few nutrients (such as a bag of chips).  Know how many calories are in the foods you eat most often. This will help you calculate calorie counts faster.  Pay attention to calories in drinks. Low-calorie drinks include water and unsweetened drinks.  Pay attention to nutrition labels for "low fat" or "fat free" foods. These foods sometimes have the same amount of calories or more calories than the full fat versions. They also often have added sugar, starch, or salt, to make up for flavor that was removed with the fat.  Find a way of tracking calories that works for you. Get creative. Try different apps or programs if writing down calories does not work for you. What are some portion control tips?  Know how many calories are in a serving. This will help you know how many servings of a certain food you can have.  Use a measuring cup to measure serving sizes. You could also try weighing out portions on a kitchen scale. With time, you will be able to estimate serving sizes for some foods.  Take some time to put servings of different foods on your favorite plates, bowls, and cups so you know what a serving looks like.  Try not to eat straight from a bag or box. Doing this can lead to overeating. Put the amount you would like to eat in a cup or on a plate to make sure you are eating the right portion.  Use smaller plates, glasses, and bowls to prevent overeating.  Try not to multitask (for example, watch TV or use your computer) while eating. If it is time to eat, sit down at a table and enjoy your food. This will help you to know when you are full. It will also help you to be aware of what you are eating and how much you are eating. What are tips for following this plan? Reading food labels  Check the calorie count compared to the serving size. The serving size may be smaller than what you are used to  eating.  Check the source of the calories. Make sure the food you are eating is high in vitamins and protein and low in saturated and trans fats. Shopping  Read nutrition labels while you shop. This will help you make healthy decisions before you decide to purchase your food.  Make a grocery list and stick to it. Cooking  Try to cook your favorite foods in a healthier way. For example, try baking instead of frying.  Use low-fat dairy products. Meal planning  Use more fruits and vegetables. Half of your plate should be fruits   and vegetables.  Include lean proteins like poultry and fish. How do I count calories when eating out?  Ask for smaller portion sizes.  Consider sharing an entree and sides instead of getting your own entree.  If you get your own entree, eat only half. Ask for a box at the beginning of your meal and put the rest of your entree in it so you are not tempted to eat it.  If calories are listed on the menu, choose the lower calorie options.  Choose dishes that include vegetables, fruits, whole grains, low-fat dairy products, and lean protein.  Choose items that are boiled, broiled, grilled, or steamed. Stay away from items that are buttered, battered, fried, or served with cream sauce. Items labeled "crispy" are usually fried, unless stated otherwise.  Choose water, low-fat milk, unsweetened iced tea, or other drinks without added sugar. If you want an alcoholic beverage, choose a lower calorie option such as a glass of wine or light beer.  Ask for dressings, sauces, and syrups on the side. These are usually high in calories, so you should limit the amount you eat.  If you want a salad, choose a garden salad and ask for grilled meats. Avoid extra toppings like bacon, cheese, or fried items. Ask for the dressing on the side, or ask for olive oil and vinegar or lemon to use as dressing.  Estimate how many servings of a food you are given. For example, a serving of  cooked rice is  cup or about the size of half a baseball. Knowing serving sizes will help you be aware of how much food you are eating at restaurants. The list below tells you how big or small some common portion sizes are based on everyday objects: ? 1 oz-4 stacked dice. ? 3 oz-1 deck of cards. ? 1 tsp-1 die. ? 1 Tbsp- a ping-pong ball. ? 2 Tbsp-1 ping-pong ball. ?  cup- baseball. ? 1 cup-1 baseball. Summary  Calorie counting means keeping track of how many calories you eat and drink each day. If you eat fewer calories than your body needs, you should lose weight.  A healthy amount of weight to lose per week is usually 1-2 lb (0.5-0.9 kg). This usually means reducing your daily calorie intake by 500-750 calories.  The number of calories in a food can be found on a Nutrition Facts label. If a food does not have a Nutrition Facts label, try to look up the calories online or ask your dietitian for help.  Use your calories on foods and drinks that will fill you up, and not on foods and drinks that will leave you hungry.  Use smaller plates, glasses, and bowls to prevent overeating. This information is not intended to replace advice given to you by your health care provider. Make sure you discuss any questions you have with your health care provider. Document Released: 01/01/2005 Document Revised: 09/20/2017 Document Reviewed: 12/02/2015 Elsevier Interactive Patient Education  2019 [employer]  Balanitis  Balanitis is swelling and irritation (inflammation) of the head of the penis (glans penis). The condition may also cause inflammation of the skin around the glans penis (foreskin) in men who have not been circumcised. It may develop because of an infection or another medical condition. Balanitis occurs most often among men who have not had their foreskin removed (uncircumcised men). Balanitis sometimes causes scarring of the penis or foreskin, which can require surgery. Untreated  balanitis can increase the risk of penile cancer. What  are the causes? Common causes of this condition include:  Poor personal hygiene, especially in uncircumcised men. Not cleaning the glans penis and foreskin well can result in buildup of bacteria, viruses, and yeast, which can lead to infection and inflammation.  Irritation and lack of air flow due to fluid (smegma) that can build up on the glans penis. Other causes include:  Chemical irritation from products such as soaps or shower gels (especially those that have fragrance), condoms, personal lubricants, petroleum jelly, spermicides, or fabric softeners.  Skin conditions, such as eczema, dermatitis, and psoriasis.  Allergies to medicines, such as tetracycline and sulfa drugs.  Certain medical conditions, including liver cirrhosis, congestive heart failure, diabetes, and kidney disease.  Infections, such as candidiasis, HPV (human papillomavirus), herpes simplex, gonorrhea, and syphilis.  Severe obesity. What increases the risk? The following factors may make you more likely to develop this condition:  Having diabetes. This is the most common risk factor.  Having a tight foreskin that is difficult to pull back (retract) past the glans.  Having sexual intercourse without using a condom. What are the signs or symptoms? Symptoms of this condition include:  Discharge from under the foreskin.  A bad smell.  Pain or difficulty retracting the foreskin.  Tenderness, redness, and swelling of the glans.  A rash or sores on the glans or foreskin.  Itchiness.  Inability to get an erection due to pain.  Difficulty urinating.  Scarring of the penis or foreskin, in some cases. How is this diagnosed? This condition may be diagnosed based on:  A physical exam.  Testing a swab of discharge to check for bacterial or fungal infection.  Blood tests: ? To check for viruses that can cause balanitis. ? To check your blood sugar  (glucose) level. High blood glucose could be a sign of diabetes, which can cause balanitis. How is this treated? Treatment for balanitis depends on the cause. Treatment may include:  Improving personal hygiene. Your health care provider may recommend sitting in a bath of warm water that is deep enough to cover your hips and buttocks (sitz bath).  Medicines such as: ? Creams or ointments to reduce swelling (steroids) or to treat an infection. ? Antibiotic medicine. ? Antifungal medicine.  Surgery to remove or cut the foreskin (circumcision). This may be done if you have scarring on the foreskin that makes it difficult to retract.  Controlling other medical problems that may be causing your condition or making it worse. Follow these instructions at home:  Do not have sex until the condition clears up, or until your health care provider approves.  Keep your penis clean and dry. Take sitz baths as recommended by your health care provider.  Avoid products that irritate your skin or make symptoms worse, such as soaps and shower gels that have fragrance.  Take over-the-counter and prescription medicines only as told by your health care provider. ? If you were prescribed an antibiotic medicine or a cream or ointment, use it as told by your health care provider. Do not stop using your medicine, cream, or ointment even if you start to feel better. ? Do not drive or use heavy machinery while taking prescription pain medicine. Contact a health care provider if:  Your symptoms get worse or do not improve with home care.  You develop chills or a fever.  You have trouble urinating.  You cannot retract your foreskin. Get help right away if:  You develop severe pain.  You are unable to  urinate. Summary  Balanitis is inflammation of the head of the penis (glans penis) caused by irritation or infection.  Balanitis causes pain, redness, and swelling of the glans penis.  This condition is  most common among uncircumcised men who do not keep their glans penis clean and in men who have diabetes.  Treatment may include creams or ointments.  Good hygiene is important for prevention. This includes pulling back the foreskin when washing your penis. This information is not intended to replace advice given to you by your health care provider. Make sure you discuss any questions you have with your health care provider. Document Released: 05/20/2008 Document Revised: 11/21/2015 Document Reviewed: 11/21/2015 Elsevier Interactive Patient Education  2019 [employer].

## 2018-07-10 NOTE — Progress Notes (Signed)
Subjective:  Patient ID: Brandon Finley, male    DOB: [birth year]  Age: 58 y.o. MRN: [id number]  CC: Diabetes   HPI Brandon Finley  is a 58 year old male with a history of hypertension, type 2 diabetes mellitus (A1c 10.0), obesity, right total hip arthroplasty in 02/2016 who presents today for a follow-up visit. His A1c is 10.0 which has trended up from 7.9 and he endorses compliance with metformin and is trying to lose weight but compliance with a diabetic diet cannot be emphasized.  He plans to go to the gym once it has opened up His blood pressure is elevated and he is yet to take his antihypertensive today.  He complains of inability to retract his penile skin which he attributes to using a dirty wipe to clean his penis after he used the bathroom.  He denies discharge, pain, dysuria, fever. He is uncircumcised.  Past Medical History:  Diagnosis Date  . Arthritis   . Hypertension     Past Surgical History:  Procedure Laterality Date  . TOTAL HIP ARTHROPLASTY Right 03/13/2016   Procedure: RIGHT TOTAL HIP ARTHROPLASTY ANTERIOR APPROACH;  Surgeon: Mcarthur Rossetti, MD;  Location: [place];  Service: Orthopedics;  Laterality: Right;    Family History  Problem Relation Age of Onset  . Hypertension Father     Allergies  Allergen Reactions  . No Known Allergies     Outpatient Medications Prior to Visit  Medication Sig Dispense Refill  . aspirin EC 81 MG tablet Take 1 tablet (81 mg total) by mouth daily. 90 tablet 1  . cetirizine (ZYRTEC) 10 MG tablet Take 1 tablet (10 mg total) by mouth daily. 30 tablet 1  . methocarbamol (ROBAXIN) 500 MG tablet Take 1 tablet (500 mg total) by mouth 2 (two) times daily. 20 tablet 0  . nabumetone (RELAFEN) 750 MG tablet Take 1 tablet by mouth twice daily as needed 60 tablet 11  . naproxen (NAPROSYN) 500 MG tablet Take 1 tablet (500 mg total) by mouth 2 (two) times daily with a meal. 30 tablet 0  . lisinopril-hydrochlorothiazide  (ZESTORETIC) 10-12.5 MG tablet Take 1 tablet by mouth every day 90 tablet 0  . metFORMIN (GLUCOPHAGE) 500 MG tablet Take 2 tablets by mouth twice daily with meals 120 tablet 2  . pravastatin (PRAVACHOL) 20 MG tablet Take 1 tablet by mouth every day 30 tablet 2  . methylPREDNISolone (MEDROL) 4 MG tablet Medrol dose pack. Take as instructed (Patient not taking: Reported on 01/13/2018) 21 tablet 0   No facility-administered medications prior to visit.      ROS Review of Systems  Constitutional: Negative for activity change and appetite change.  HENT: Negative for sinus pressure and sore throat.   Eyes: Negative for visual disturbance.  Respiratory: Negative for cough, chest tightness and shortness of breath.   Cardiovascular: Negative for chest pain and leg swelling.  Gastrointestinal: Negative for abdominal distention, abdominal pain, constipation and diarrhea.  Endocrine: Negative.   Genitourinary: Negative for dysuria.  Musculoskeletal: Negative for joint swelling and myalgias.  Skin: Negative for rash.  Allergic/Immunologic: Negative.   Neurological: Negative for weakness, light-headedness and numbness.  Psychiatric/Behavioral: Negative for dysphoric mood and suicidal ideas.    Objective:  BP (!) 165/95   Pulse 80   Temp 97.6 F (36.4 C) (Oral)   Ht '6\' 1"'  (1.854 m)   Wt (!) 335 lb (152 kg)   SpO2 95%   BMI 44.20 kg/m   BP/Weight 07/10/2018  04/02/2018 4/70/9628  Systolic BP 366 294 765  Diastolic BP 95 82 75  Wt. (Lbs) 335 330.6 337.8  BMI 44.2 43.62 44.57      Physical Exam Constitutional:      Appearance: He is well-developed.  Cardiovascular:     Rate and Rhythm: Normal rate.     Heart sounds: Normal heart sounds. No murmur.  Pulmonary:     Effort: Pulmonary effort is normal.     Breath sounds: Normal breath sounds. No wheezing or rales.  Chest:     Chest wall: No tenderness.  Abdominal:     General: Bowel sounds are normal. There is no distension.      Palpations: Abdomen is soft. There is no mass.     Tenderness: There is no abdominal tenderness.  Genitourinary:    Comments: Uncircumcised Unable to retract penile skin, evidence of slight inflammation and splitting Musculoskeletal: Normal range of motion.  Neurological:     Mental Status: He is alert and oriented to person, place, and time.  Psychiatric:        Mood and Affect: Mood normal.      CMP Latest Ref Rng & Units 06/05/2017 06/22/2016 03/14/2016  Glucose 65 - 99 mg/dL 84 118(H) 135(H)  BUN 6 - 24 mg/dL '7 9 12  ' Creatinine 0.76 - 1.27 mg/dL 0.66(L) 0.74(L) 0.69  Sodium 134 - 144 mmol/L 140 142 136  Potassium 3.5 - 5.2 mmol/L 4.3 4.5 3.8  Chloride 96 - 106 mmol/L 100 103 102  CO2 20 - 29 mmol/L '26 25 28  ' Calcium 8.7 - 10.2 mg/dL 9.4 9.8 8.8(L)  Total Protein 6.0 - 8.5 g/dL 7.6 - -  Total Bilirubin 0.0 - 1.2 mg/dL 0.4 - -  Alkaline Phos 39 - 117 IU/L 86 - -  AST 0 - 40 IU/L 21 - -  ALT 0 - 44 IU/L 24 - -    Lipid Panel     Component Value Date/Time   CHOL 179 06/05/2017 1456   TRIG 115 06/05/2017 1456   HDL 35 (L) 06/05/2017 1456   CHOLHDL 5.1 (H) 06/05/2017 1456   CHOLHDL 6.5 07/25/2012 1047   VLDL 16 07/25/2012 1047   LDLCALC 121 (H) 06/05/2017 1456    CBC    Component Value Date/Time   WBC 11.2 (H) 03/14/2016 0756   RBC 3.98 (L) 03/14/2016 0756   HGB 12.6 (L) 03/14/2016 0756   HCT 38.2 (L) 03/14/2016 0756   PLT 212 03/14/2016 0756   MCV 96.0 03/14/2016 0756   MCH 31.7 03/14/2016 0756   MCHC 33.0 03/14/2016 0756   RDW 12.0 03/14/2016 0756    Lab Results  Component Value Date   HGBA1C 10.0 (A) 07/10/2018    Assessment & Plan:   1. Type 2 diabetes mellitus with other specified complication, unspecified whether long term insulin use (HCC) Uncontrolled with A1c of 10.0 Victoza added to regimen Clinical pharmacist called in for education on titration instructions and adherence with diabetic diet and lifestyle modification Counseled on Diabetic diet,  my plate method, 465 minutes of moderate intensity exercise/week Keep blood sugar logs with fasting goals of 80-120 mg/dl, random of less than 180 and in the event of sugars less than 60 mg/dl or greater than 400 mg/dl please notify the clinic ASAP. It is recommended that you undergo annual eye exams and annual foot exams. Pneumonia vaccine is recommended. - POCT glucose (manual entry) - POCT glycosylated hemoglobin (Hb A1C) - CMP14+EGFR - Lipid panel - Microalbumin /  creatinine urine ratio - liraglutide (VICTOZA) 18 MG/3ML SOPN; Inject subcutaneously breakfast 0.6 mg for 1 week then 1.2 mg for 1 week then 1.8 mg thereafter  Dispense: 30 mL; Refill: 3 - metFORMIN (GLUCOPHAGE) 500 MG tablet; Take 2 tablets (1,000 mg total) by mouth 2 (two) times daily with a meal.  Dispense: 360 tablet; Refill: 1 - Ambulatory referral to Podiatry  2. Morbid obesity ([place]) Discussed reducing portion sizes, avoiding late meals, increasing physical activity Victoza will be beneficial in this regard  3. Essential hypertension Uncontrolled He is yet to take his antihypertensive - lisinopril-hydrochlorothiazide (ZESTORETIC) 10-12.5 MG tablet; Take 1 tablet by mouth daily.  Dispense: 90 tablet; Refill: 1  4. Mixed hyperlipidemia Currently on pravastatin-I have switched to atorvastatin which is moderate intensity statin - atorvastatin (LIPITOR) 40 MG tablet; Take 1 tablet (40 mg total) by mouth daily.  Dispense: 90 tablet; Refill: 1  5. Candidal balanitis Discussed penile hygiene - clotrimazole (LOTRIMIN) 1 % cream; Apply 1 application topically 2 (two) times daily.  Dispense: 60 g; Refill: 0   Meds ordered this encounter  Medications  . liraglutide (VICTOZA) 18 MG/3ML SOPN    Sig: Inject subcutaneously breakfast 0.6 mg for 1 week then 1.2 mg for 1 week then 1.8 mg thereafter    Dispense:  30 mL    Refill:  3  . atorvastatin (LIPITOR) 40 MG tablet    Sig: Take 1 tablet (40 mg total) by mouth daily.     Dispense:  90 tablet    Refill:  1    Discontinue Pravastatin  . metFORMIN (GLUCOPHAGE) 500 MG tablet    Sig: Take 2 tablets (1,000 mg total) by mouth 2 (two) times daily with a meal.    Dispense:  360 tablet    Refill:  1  . lisinopril-hydrochlorothiazide (ZESTORETIC) 10-12.5 MG tablet    Sig: Take 1 tablet by mouth daily.    Dispense:  90 tablet    Refill:  1  . clotrimazole (LOTRIMIN) 1 % cream    Sig: Apply 1 application topically 2 (two) times daily.    Dispense:  60 g    Refill:  0    Follow-up: Return in about 3 months (around 10/10/2018) for medical conditions: 3 week follow up with luke(victoza).       Charlott Rakes, MD, FAAFP. [facility] and [place], [place]   07/10/2018, 1:07 PM

## 2018-07-10 NOTE — Progress Notes (Signed)
Patient is fasting and did not take morning medications.

## 2018-07-11 ENCOUNTER — Telehealth: Payer: Self-pay

## 2018-07-11 ENCOUNTER — Other Ambulatory Visit: Payer: Self-pay

## 2018-07-11 DIAGNOSIS — E1169 Type 2 diabetes mellitus with other specified complication: Secondary | ICD-10-CM

## 2018-07-11 LAB — CMP14+EGFR
ALT: 59 IU/L — ABNORMAL HIGH (ref 0–44)
AST: 41 IU/L — ABNORMAL HIGH (ref 0–40)
Albumin/Globulin Ratio: 1.5 (ref 1.2–2.2)
Albumin: 4.8 g/dL (ref 3.8–4.9)
Alkaline Phosphatase: 88 IU/L (ref 39–117)
BUN/Creatinine Ratio: 13 (ref 9–20)
BUN: 12 mg/dL (ref 6–24)
Bilirubin Total: 0.5 mg/dL (ref 0.0–1.2)
CO2: 23 mmol/L (ref 20–29)
Calcium: 9.7 mg/dL (ref 8.7–10.2)
Chloride: 101 mmol/L (ref 96–106)
Creatinine, Ser: 0.9 mg/dL (ref 0.76–1.27)
GFR calc Af Amer: 109 mL/min/{1.73_m2} (ref >59)
GFR calc non Af Amer: 94 mL/min/{1.73_m2} (ref >59)
Globulin, Total: 3.1 g/dL (ref 1.5–4.5)
Glucose: 258 mg/dL — ABNORMAL HIGH (ref 65–99)
Potassium: 4.4 mmol/L (ref 3.5–5.2)
Sodium: 136 mmol/L (ref 134–144)
Total Protein: 7.9 g/dL (ref 6.0–8.5)

## 2018-07-11 LAB — MICROALBUMIN / CREATININE URINE RATIO
Creatinine, Urine: 129.5 mg/dL
Microalb/Creat Ratio: 16 mg/g creat (ref 0–29)
Microalbumin, Urine: 20.6 ug/mL

## 2018-07-11 LAB — LIPID PANEL
Chol/HDL Ratio: 6.3 ratio — ABNORMAL HIGH (ref 0.0–5.0)
Cholesterol, Total: 203 mg/dL — ABNORMAL HIGH (ref 100–199)
HDL: 32 mg/dL — ABNORMAL LOW (ref >39)
LDL Calculated: 133 mg/dL — ABNORMAL HIGH (ref 0–99)
Triglycerides: 191 mg/dL — ABNORMAL HIGH (ref 0–149)
VLDL Cholesterol Cal: 38 mg/dL (ref 5–40)

## 2018-07-11 MED ORDER — TRUEPLUS PEN NEEDLES 32G X 4 MM MISC: 3 refills | Status: DC

## 2018-07-11 MED FILL — TRUEPLUS PEN NDL 32GX5/32": 32G X 4 MM | 30 days supply | Qty: 100 | Fill #0

## 2018-07-11 MED FILL — TRUEPLUS PEN NDL 32GX5/32: 32G X 4 MM | 30 days supply | Qty: 100 | Fill #0

## 2018-07-11 MED FILL — CLOTRIMAZOLE 1 % CREA: 1 | 30 days supply | Qty: 30 | Fill #0

## 2018-07-11 NOTE — Telephone Encounter (Signed)
Patient name and DOB has been verified Patient was informed of lab results. Patient had no questions.  

## 2018-07-11 NOTE — Progress Notes (Unsigned)
Error pen needles were already filled.

## 2018-07-11 NOTE — Telephone Encounter (Signed)
-----   Message from Charlott Rakes, MD sent at 07/11/2018 12:20 PM EDT ----- Cholesterol is elevated but I had changed his Pravastatin to Atorvastatin which should be beneficial. He will need to increase his exercise and adhere to a low cholesterol diet.

## 2018-08-05 ENCOUNTER — Ambulatory Visit (INDEPENDENT_AMBULATORY_CARE_PROVIDER_SITE_OTHER): Payer: Medicare Other | Admitting: Podiatry

## 2018-08-05 ENCOUNTER — Other Ambulatory Visit: Payer: Self-pay

## 2018-08-05 ENCOUNTER — Encounter: Payer: Self-pay | Admitting: Podiatry

## 2018-08-05 DIAGNOSIS — M79674 Pain in right toe(s): Secondary | ICD-10-CM

## 2018-08-05 DIAGNOSIS — E119 Type 2 diabetes mellitus without complications: Secondary | ICD-10-CM | POA: Diagnosis not present

## 2018-08-05 DIAGNOSIS — B351 Tinea unguium: Secondary | ICD-10-CM | POA: Diagnosis not present

## 2018-08-05 DIAGNOSIS — M79675 Pain in left toe(s): Secondary | ICD-10-CM | POA: Diagnosis not present

## 2018-08-05 NOTE — Progress Notes (Signed)
This patient presents to the office with chief complaint of long thick nails .Marland Kitchen  This patient  says there  is  no pain and discomfort in his  feet.  This patient says there are long thick painful nails and he has been unable to self treat due to hip surgery. These nails are painful walking and wearing shoes.  Patient has no history of infection or drainage from both feet.  Patient is unable to  self treat his own nails . This patient presents  to the office today for treatment of the  long nails .  General Appearance  Alert, conversant and in no acute stress.  Vascular  Dorsalis pedis and posterior tibial  pulses are palpable  bilaterally.  Capillary return is within normal limits  bilaterally. Temperature is within normal limits  bilaterally.  Neurologic  Senn-Weinstein monofilament wire test within normal limits  bilaterally. Muscle power within normal limits bilaterally.  Nails Thick disfigured discolored nails with subungual debris  from hallux to fifth toes bilaterally. No evidence of bacterial infection or drainage bilaterally.  Orthopedic  No limitations of motion of motion feet .  No crepitus or effusions noted.  No bony pathology or digital deformities noted.  Skin  normotropic skin with no porokeratosis noted bilaterally.  No signs of infections or ulcers noted. Dry skin noted.    Onychomycosis  Diabetes with no foot complications   Debride nails x 10.      RTC 3 months.   Gardiner Barefoot DPM

## 2018-08-11 ENCOUNTER — Other Ambulatory Visit: Payer: Self-pay

## 2018-08-11 ENCOUNTER — Ambulatory Visit: Payer: Medicare Other | Attending: Family Medicine | Admitting: Pharmacist

## 2018-08-11 DIAGNOSIS — E1169 Type 2 diabetes mellitus with other specified complication: Secondary | ICD-10-CM

## 2018-08-11 DIAGNOSIS — E1165 Type 2 diabetes mellitus with hyperglycemia: Secondary | ICD-10-CM

## 2018-08-11 LAB — GLUCOSE, POCT (MANUAL RESULT ENTRY): POC Glucose: 196 mg/dl — AB (ref 70–99)

## 2018-08-11 MED ORDER — ONETOUCH VERIO VI STRP: ORAL_STRIP | 11 refills | Status: DC

## 2018-08-11 MED ORDER — ONETOUCH VERIO W/DEVICE KIT: PACK | 0 refills | Status: DC

## 2018-08-11 MED ORDER — ONETOUCH DELICA LANCETS 33G MISC: 11 refills | Status: DC

## 2018-08-11 MED FILL — ONETOUCH DELICA PLUS LANCET: 30 days supply | Qty: 100 | Fill #0

## 2018-08-11 MED FILL — ONE TOUCH VERIO TEST STRIP: 30 days supply | Qty: 100 | Fill #0

## 2018-08-11 MED FILL — ONETOUCH VERIO METER SYSTEM: W/DEVICE | 30 days supply | Qty: 1 | Fill #0

## 2018-08-11 NOTE — Progress Notes (Signed)
    S:    PCP: Dr. Margarita Rana  No chief complaint on file.  Patient arrives in good spirits.  Presents for Victoza follow-up at the request of Dr. Margarita Rana. Patient was referred and last seen by Dr. Margarita Rana on 07/10/18.   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  Current diabetes medications include: liraglutide 1.8 mg daily, metformin 500 mg tabs; takes 1000 mg (2 tabs) BID Current hypertension medications include: lisinopril-HCTZ 10-12.5 mg daily Current hyperlipidemia medications include: atorvastatin 40 mg daily  Patient denies hypoglycemic events.  Patient reported dietary habits: Eats 2-3 meals/day Breakfast: eggs, cereal (Fruit Loops, Cheerios), white bread toast Lunch: usually skips; may have fast food ([employer]) Dinner: largest meal; usually eats breaded or fried chicken, baked beans Snacks: denies sweets, candy or dessert foods  Drinks: drinks mostly water; admits to drinking orange juice and sweet tea occasionally during the week  Patient-reported exercise habits:  - 10-15 mins/day on stationary bike   Patient denies nocturia.  Patient denies neuropathy. Patient denies visual changes. Patient reports self foot exams.    O:  POCT CBG: 196 (254 at previous visit) Home fasting CBG: not checking   2 hour post-prandial/random CBG: not checking  Lab Results  Component Value Date   HGBA1C 10.0 (A) 07/10/2018   There were no vitals filed for this visit.  Lipid Panel     Component Value Date/Time   CHOL 203 (H) 07/10/2018 1130   TRIG 191 (H) 07/10/2018 1130   HDL 32 (L) 07/10/2018 1130   CHOLHDL 6.3 (H) 07/10/2018 1130   CHOLHDL 6.5 07/25/2012 1047   VLDL 16 07/25/2012 1047   LDLCALC 133 (H) 07/10/2018 1130   Clinical ASCVD: No  The 10-year ASCVD risk score Mikey Bussing DC Jr., et al., 2013) is: 36.4%   Values  used to calculate the score:     Age: 58 years     Sex: Male     Is Non-Hispanic African American: Yes     Diabetic: Yes     Tobacco smoker: No     Systolic Blood Pressure: 858 mmHg     Is BP treated: Yes     HDL Cholesterol: 32 mg/dL     Total Cholesterol: 203 mg/dL    A/P: Diabetes longstanding currently uncontrolled. Patient is able to verbalize appropriate hypoglycemia management plan. Patient is adherent with medication. Control is suboptimal due to dietary indiscretion. Patient does not check sugars at home. He is tolerating metformin and Victoza well. Emphasized med compliance and need for adherence to a diabetic diet. -Continued current regimen -OneTouch Verio supplies sent to the pharmacy -Extensively discussed pathophysiology of DM, recommended lifestyle interventions, dietary effects on glycemic control -MyPlate -Counseled on s/sx of and management of hypoglycemia -Next A1C anticipated 10/10/18.   ASCVD risk - primary prevention in patient with DM. Last LDL is not controlled. ASCVD risk score is >20%  - high intensity statin indicated. Pt was recently changed from pravastatin to atorvastatin. Encouraged compliance to this and a low-fat, low-cholesterol diet.  -Continued atorvastatin 40 mg.   HM:  -Pneumovax is recommended -Pt deferred to next appt  Written patient instructions provided. Total time in face to face counseling 30 minutes.   Follow up Pharmacist Clinic Visit in 1 month.     Benard Halsted, PharmD, [place] [phone]

## 2018-08-11 NOTE — Patient Instructions (Signed)
Thank you for coming to see me today. Please do the following:  1. Continue taking medications every day! 2. Continue checking blood sugars at home. 3. Continue making the lifestyle changes we've discussed together during our visit. Diet and exercise play a significant role in improving your blood sugars.  4. Follow-up with me in 1 month.   Hypoglycemia or low blood sugar:   Low blood sugar can happen quickly and may become an emergency if not treated right away.   While this shouldn't happen often, it can be brought upon if you skip a meal or do not eat enough. Also, if your insulin or other diabetes medications are dosed too high, this can cause your blood sugar to go to low.   Warning signs of low blood sugar include: 1. Feeling shaky or dizzy 2. Feeling weak or tired  3. Excessive hunger 4. Feeling anxious or upset  5. Sweating even when you aren't exercising  What to do if I experience low blood sugar? 1. Check your blood sugar with your meter. If lower than 70, proceed to step 2.  2. Treat with 3-4 glucose tablets or 3 packets of regular sugar. If these aren't around, you can try hard candy. Yet another option would be to drink 4 ounces of fruit juice or 6 ounces of REGULAR soda.  3. Re-check your sugar in 15 minutes. If it is still below 70, do what you did in step 2 again. If has come back up, go ahead and eat a snack or small meal at this time.

## 2018-08-12 ENCOUNTER — Encounter: Payer: Self-pay | Admitting: Pharmacist

## 2018-08-20 ENCOUNTER — Telehealth: Payer: Self-pay | Admitting: Family Medicine

## 2018-08-20 MED FILL — VICTOZA 2-PAK 18 MG/3 ML PE: 18 | 17 days supply | Qty: 3 | Fill #1

## 2018-08-20 NOTE — Telephone Encounter (Signed)
Pt was requesting for refills to be placed. Refills placed.

## 2018-08-20 NOTE — Telephone Encounter (Signed)
Pt would like to be called back, he has medication questions..please follow up

## 2018-09-02 ENCOUNTER — Ambulatory Visit: Payer: Medicare Other | Attending: Family Medicine | Admitting: Pharmacist

## 2018-09-02 ENCOUNTER — Other Ambulatory Visit: Payer: Self-pay

## 2018-09-02 ENCOUNTER — Encounter: Payer: Self-pay | Admitting: Pharmacist

## 2018-09-02 DIAGNOSIS — E1169 Type 2 diabetes mellitus with other specified complication: Secondary | ICD-10-CM | POA: Diagnosis not present

## 2018-09-02 DIAGNOSIS — E1165 Type 2 diabetes mellitus with hyperglycemia: Secondary | ICD-10-CM

## 2018-09-02 LAB — GLUCOSE, POCT (MANUAL RESULT ENTRY): POC Glucose: 206 mg/dl — AB (ref 70–99)

## 2018-09-02 MED FILL — CLOTRIMAZOLE 1 % CREA: 1 | 30 days supply | Qty: 30 | Fill #1

## 2018-09-02 NOTE — Progress Notes (Signed)
S:    PCP: Dr. Margarita Rana  No chief complaint on file.  Patient arrives in good spirits.  Presents for Victoza follow-up at the request of Dr. Margarita Rana. Patient was referred and last seen by Dr. Margarita Rana on 07/10/18. I last saw him on 08/11/18 and sent in blood glucose testing supplies.  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  Current diabetes medications include: liraglutide 1.8 mg daily, metformin 500 mg tabs; takes 1000 mg (2 tabs) BID Current hypertension medications include: lisinopril-HCTZ 10-12.5 mg daily Current hyperlipidemia medications include: atorvastatin 40 mg daily  Patient denies hypoglycemic events.  Patient reported dietary habits: Eats 2-3 meals/day Breakfast: eggs, cereal (Fruit Loops, Cheerios), white bread toast Lunch: usually skips; may have fast food ([employer]) Dinner: largest meal; usually eats breaded or fried chicken, baked beans Snacks: denies sweets, candy or dessert foods  Drinks: drinks mostly water; admits to drinking orange juice and sweet tea occasionally during the week  Patient-reported exercise habits:  - 10-15 mins/day on stationary bike   Patient denies nocturia.  Patient denies neuropathy. Patient denies visual changes. Patient reports self foot exams.    O:  POCT CBG: 206 Home fasting CBG: not checking   2 hour post-prandial/random CBG: not checking  Lab Results  Component Value Date   HGBA1C 10.0 (A) 07/10/2018   There were no vitals filed for this visit.  Lipid Panel     Component Value Date/Time   CHOL 203 (H) 07/10/2018 1130   TRIG 191 (H) 07/10/2018 1130   HDL 32 (L) 07/10/2018 1130   CHOLHDL 6.3 (H) 07/10/2018 1130   CHOLHDL 6.5 07/25/2012 1047   VLDL 16 07/25/2012 1047   LDLCALC 133 (H) 07/10/2018 1130   Clinical ASCVD: No  The 10-year ASCVD risk score Mikey Bussing  DC Jr., et al., 2013) is: 36.4%   Values used to calculate the score:     Age: 58 years     Sex: Male     Is Non-Hispanic African American: Yes     Diabetic: Yes     Tobacco smoker: No     Systolic Blood Pressure: 370 mmHg     Is BP treated: Yes     HDL Cholesterol: 32 mg/dL     Total Cholesterol: 203 mg/dL    A/P: Diabetes longstanding currently uncontrolled. Patient is able to verbalize appropriate hypoglycemia management plan. Patient is adherent with medication. Control is suboptimal due to dietary indiscretion.   Patient was educated on the use of the OneTouch Verio blood glucose meter. Reviewed necessary supplies and operation of the meter. Also reviewed goal blood glucose levels. Patient was able to demonstrate use. All questions and concerns were addressed.  -Continued current regimen -Encouraged med compliance -Extensively discussed pathophysiology of DM, recommended lifestyle interventions, dietary effects on glycemic control -MyPlate -Counseled on s/sx of and management of hypoglycemia -Next A1C anticipated 10/10/18.   ASCVD risk - primary prevention in patient with DM. Last LDL is not controlled. ASCVD risk score is >20%  - high intensity statin indicated. Pt was recently changed from pravastatin to atorvastatin. Encouraged compliance to this and a low-fat, low-cholesterol diet.  -Continued atorvastatin 40 mg.   HM:  -Pneumovax is recommended -Pt deferred to next appt  Written patient instructions provided. Total time in face to face counseling 30 minutes.   Follow up Pharmacist Clinic Visit in 1 month.  Benard Halsted, PharmD, [place] [phone]

## 2018-09-11 ENCOUNTER — Ambulatory Visit: Payer: Medicare Other | Admitting: Pharmacist

## 2018-09-17 ENCOUNTER — Other Ambulatory Visit: Payer: Self-pay | Admitting: Family Medicine

## 2018-10-14 ENCOUNTER — Other Ambulatory Visit: Payer: Self-pay | Admitting: Family Medicine

## 2018-10-14 DIAGNOSIS — E1169 Type 2 diabetes mellitus with other specified complication: Secondary | ICD-10-CM

## 2018-10-15 ENCOUNTER — Other Ambulatory Visit: Payer: Self-pay

## 2018-10-15 ENCOUNTER — Ambulatory Visit: Payer: Medicare Other | Attending: Family Medicine | Admitting: Family Medicine

## 2018-11-07 ENCOUNTER — Ambulatory Visit: Payer: Medicare Other | Admitting: Podiatry

## 2019-01-16 ENCOUNTER — Other Ambulatory Visit (INDEPENDENT_AMBULATORY_CARE_PROVIDER_SITE_OTHER): Payer: Self-pay | Admitting: Orthopaedic Surgery

## 2019-01-21 ENCOUNTER — Ambulatory Visit: Payer: Medicare Other

## 2019-01-26 ENCOUNTER — Ambulatory Visit: Payer: Medicare Other | Admitting: Family Medicine

## 2019-02-04 ENCOUNTER — Other Ambulatory Visit: Payer: Self-pay

## 2019-02-04 ENCOUNTER — Encounter: Payer: Self-pay | Admitting: Family Medicine

## 2019-02-04 ENCOUNTER — Ambulatory Visit: Payer: Medicare Other | Attending: Family Medicine | Admitting: Family Medicine

## 2019-02-04 VITALS — BP 161/85 | HR 85 | Ht 73.0 in | Wt 330.0 lb

## 2019-02-04 DIAGNOSIS — M5489 Other dorsalgia: Secondary | ICD-10-CM | POA: Diagnosis not present

## 2019-02-04 DIAGNOSIS — M549 Dorsalgia, unspecified: Secondary | ICD-10-CM

## 2019-02-04 DIAGNOSIS — E782 Mixed hyperlipidemia: Secondary | ICD-10-CM

## 2019-02-04 DIAGNOSIS — Z23 Encounter for immunization: Secondary | ICD-10-CM

## 2019-02-04 DIAGNOSIS — Z6841 Body Mass Index (BMI) 40.0 and over, adult: Secondary | ICD-10-CM

## 2019-02-04 DIAGNOSIS — E1165 Type 2 diabetes mellitus with hyperglycemia: Secondary | ICD-10-CM | POA: Diagnosis not present

## 2019-02-04 DIAGNOSIS — I10 Essential (primary) hypertension: Secondary | ICD-10-CM

## 2019-02-04 LAB — POCT GLYCOSYLATED HEMOGLOBIN (HGB A1C): HbA1c, POC (controlled diabetic range): 9.7 % — AB (ref 0.0–7.0)

## 2019-02-04 LAB — GLUCOSE, POCT (MANUAL RESULT ENTRY): POC Glucose: 262 mg/dl — AB (ref 70–99)

## 2019-02-04 MED ORDER — ATORVASTATIN CALCIUM 40 MG PO TABS: 40.0000 mg | ORAL_TABLET | Freq: Every day | ORAL | 1 refills | Status: DC

## 2019-02-04 MED ORDER — GLIPIZIDE 10 MG PO TABS: 10.0000 mg | ORAL_TABLET | Freq: Two times a day (BID) | ORAL | 1 refills | Status: DC

## 2019-02-04 MED ORDER — VICTOZA 18 MG/3ML ~~LOC~~ SOPN: 1.8000 mg | PEN_INJECTOR | Freq: Every day | SUBCUTANEOUS | 6 refills | Status: DC

## 2019-02-04 MED ORDER — LISINOPRIL-HYDROCHLOROTHIAZIDE 20-25 MG PO TABS: 1.0000 | ORAL_TABLET | Freq: Every day | ORAL | 1 refills | Status: DC

## 2019-02-04 MED FILL — LISINOPRIL-HCTZ 20-25 MG TA: 20-25 | 90 days supply | Qty: 90 | Fill #0

## 2019-02-04 MED FILL — ATORVASTATIN CALCIUM 40 MG: 40 | 90 days supply | Qty: 90 | Fill #0

## 2019-02-04 MED FILL — VICTOZA 18 MG/3 ML INJECT P: 18 | 30 days supply | Qty: 9 | Fill #0

## 2019-02-04 MED FILL — glipiZIDE 10 MG TABS: 10 | 90 days supply | Qty: 180 | Fill #0

## 2019-02-04 NOTE — Progress Notes (Signed)
Podiatry referral

## 2019-02-04 NOTE — Progress Notes (Signed)
 Subjective:  Patient ID: Brandon Finley, male    DOB: 04/16/1960  Age: 58 y.o. MRN: [id number]  CC: Diabetes   HPI Beckham M Cu is a 58 year old male with a history of hypertension, type 2 diabetes mellitus (A1c 9.6), obesity, right total hip arthroplasty in 02/2016 who presents today for a follow-up visit.  Since his last visit he has a couple of visits with the Clinical Pharmacist for instruction on Victoza and diabetic teaching and his A1c is 9.6 with no much improvement from 10.0 previously. Metformin was making him drowsy and he stopped it but has been compliant with Victoza but not with exercise and a diabetic diet.  Blood pressure is also elevated and he endorses compliance with his antihypertensives. For the last 2 weeks he has had R lower back pain which is a 4-5/10 now but hurts more in the morning on getting out of bed. Pain does not radiate down is extremities and he has no paresthesia, falls, loss of bladder or bowel functions.  Past Medical History:  Diagnosis Date  . Arthritis   . Hypertension     Past Surgical History:  Procedure Laterality Date  . TOTAL HIP ARTHROPLASTY Right 03/13/2016   Procedure: RIGHT TOTAL HIP ARTHROPLASTY ANTERIOR APPROACH;  Surgeon: Christopher Y Blackman, MD;  Location: [facility] OR;  Service: Orthopedics;  Laterality: Right;    Family History  Problem Relation Age of Onset  . Hypertension Father     Allergies  Allergen Reactions  . No Known Allergies     Outpatient Medications Prior to Visit  Medication Sig Dispense Refill  . aspirin EC 81 MG tablet Take 1 tablet (81 mg total) by mouth daily. 90 tablet 1  . atorvastatin (LIPITOR) 40 MG tablet Take 1 tablet (40 mg total) by mouth daily. 90 tablet 1  . cetirizine (ZYRTEC) 10 MG tablet Take 1 tablet (10 mg total) by mouth daily. 30 tablet 1  . clotrimazole (LOTRIMIN) 1 % cream Apply 1 application topically 2 (two) times daily. 60 g 0  . glucose blood (ONETOUCH VERIO) test strip Use  as instructed 100 each 11  . Insulin Pen Needle (TRUEPLUS PEN NEEDLES) 32G X 4 MM MISC Use as instructed to administer Victoza 100 each 3  . liraglutide (VICTOZA) 18 MG/3ML SOPN Inject subcutaneously breakfast 0.6 mg for 1 week then 1.2 mg for 1 week then 1.8 mg thereafter 30 mL 3  . lisinopril-hydrochlorothiazide (ZESTORETIC) 10-12.5 MG tablet Take 1 tablet by mouth every day 30 tablet 11  . metFORMIN (GLUCOPHAGE) 500 MG tablet Take 2 tablets by mouth twice daily with meals 120 tablet 11  . nabumetone (RELAFEN) 750 MG tablet Take 1 tablet by mouth twice daily as needed 60 tablet 11  . OneTouch Delica Lancets 33G MISC Use to check blood sugar up to 3 times daily. 100 each 11  . PEG 3350-KCl-Na Bicarb-NaCl (GAVILYTE-N WITH FLAVOR PACK PO)     . pravastatin (PRAVACHOL) 20 MG tablet Take 1 tablet by mouth every day 30 tablet 11  . Blood Glucose Monitoring Suppl (ONETOUCH VERIO) w/Device KIT Use to check blood sugar up to 3 times daily. 1 kit 0   No facility-administered medications prior to visit.     ROS Review of Systems  Constitutional: Negative for activity change and appetite change.  HENT: Negative for sinus pressure and sore throat.   Eyes: Negative for visual disturbance.  Respiratory: Negative for cough, chest tightness and shortness of breath.   Cardiovascular: Negative   for chest pain and leg swelling.  Gastrointestinal: Negative for abdominal distention, abdominal pain, constipation and diarrhea.  Endocrine: Negative.   Genitourinary: Negative for dysuria.  Musculoskeletal:       See hpi  Skin: Negative for rash.  Allergic/Immunologic: Negative.   Neurological: Negative for weakness, light-headedness and numbness.  Psychiatric/Behavioral: Negative for dysphoric mood and suicidal ideas.    Objective:  BP (!) 161/85   Pulse 85   Ht 6' 1" (1.854 m)   Wt (!) 330 lb (149.7 kg)   SpO2 96%   BMI 43.54 kg/m   BP/Weight 02/04/2019 07/10/2018 04/02/2018  Systolic BP 161 165 132    Diastolic BP 85 95 82  Wt. (Lbs) 330 335 330.6  BMI 43.54 44.2 43.62      Physical Exam Constitutional:      Appearance: He is well-developed. He is obese.  Neck:     Vascular: No JVD.  Cardiovascular:     Rate and Rhythm: Normal rate.     Heart sounds: Normal heart sounds. No murmur.  Pulmonary:     Effort: Pulmonary effort is normal.     Breath sounds: Normal breath sounds. No wheezing or rales.  Chest:     Chest wall: No tenderness.  Abdominal:     General: Bowel sounds are normal. There is no distension.     Palpations: Abdomen is soft. There is no mass.     Tenderness: There is no abdominal tenderness.  Musculoskeletal:        General: Normal range of motion.     Right lower leg: No edema.     Left lower leg: No edema.     Comments: No TTP of lumbar spine FROM  Neurological:     Mental Status: He is alert and oriented to person, place, and time.  Psychiatric:        Mood and Affect: Mood normal.     CMP Latest Ref Rng & Units 07/10/2018 06/05/2017 06/22/2016  Glucose 65 - 99 mg/dL 258(H) 84 118(H)  BUN 6 - 24 mg/dL 12 7 9  Creatinine 0.76 - 1.27 mg/dL 0.90 0.66(L) 0.74(L)  Sodium 134 - 144 mmol/L 136 140 142  Potassium 3.5 - 5.2 mmol/L 4.4 4.3 4.5  Chloride 96 - 106 mmol/L 101 100 103  CO2 20 - 29 mmol/L 23 26 25  Calcium 8.7 - 10.2 mg/dL 9.7 9.4 9.8  Total Protein 6.0 - 8.5 g/dL 7.9 7.6 -  Total Bilirubin 0.0 - 1.2 mg/dL 0.5 0.4 -  Alkaline Phos 39 - 117 IU/L 88 86 -  AST 0 - 40 IU/L 41(H) 21 -  ALT 0 - 44 IU/L 59(H) 24 -    Lipid Panel     Component Value Date/Time   CHOL 203 (H) 07/10/2018 1130   TRIG 191 (H) 07/10/2018 1130   HDL 32 (L) 07/10/2018 1130   CHOLHDL 6.3 (H) 07/10/2018 1130   CHOLHDL 6.5 07/25/2012 1047   VLDL 16 07/25/2012 1047   LDLCALC 133 (H) 07/10/2018 1130    CBC    Component Value Date/Time   WBC 11.2 (H) 03/14/2016 0756   RBC 3.98 (L) 03/14/2016 0756   HGB 12.6 (L) 03/14/2016 0756   HCT 38.2 (L) 03/14/2016 0756   PLT  212 03/14/2016 0756   MCV 96.0 03/14/2016 0756   MCH 31.7 03/14/2016 0756   MCHC 33.0 03/14/2016 0756   RDW 12.0 03/14/2016 0756    Lab Results  Component Value Date   HGBA1C 9.7 (  A) 02/04/2019    Assessment & Plan:  1. Type 2 diabetes mellitus with hyperglycemia, without long-term current use of insulin (HCC) Uncontrolled with A1c of 9.7 Glipizide added to regimen; discontinue Metformin due to intolerance - POCT glucose (manual entry) - POCT glycosylated hemoglobin (Hb A1C) - glipiZIDE (GLUCOTROL) 10 MG tablet; Take 1 tablet (10 mg total) by mouth 2 (two) times daily before a meal.  Dispense: 180 tablet; Refill: 1 - Ambulatory referral to Ophthalmology - liraglutide (VICTOZA) 18 MG/3ML SOPN; Inject 0.3 mLs (1.8 mg total) into the skin daily with breakfast.  Dispense: 30 mL; Refill: 6 - Microalbumin / creatinine urine ratio - Pneumococcal polysaccharide vaccine 23-valent greater than or equal to 2yo subcutaneous/IM - Flu Vaccine QUAD 36+ mos IM  2. Morbid obesity (HCC) Counseled on resricting caloric intake and increasing physical activity  3. Mixed hyperlipidemia Uncontrolled Lipid panel at next visit and will adjust regimen accordingly Counseled on ow cholesterol diet - atorvastatin (LIPITOR) 40 MG tablet; Take 1 tablet (40 mg total) by mouth daily.  Dispense: 90 tablet; Refill: 1  4. Musculoskeletal back pain Pain is minimal at this time He does have Nabumetone which he can use  5. Essential hypertension Uncontrolled Increased dose of Lisinopril/HCTZ - lisinopril-hydrochlorothiazide (ZESTORETIC) 20-25 MG tablet; Take 1 tablet by mouth daily.  Dispense: 90 tablet; Refill: 1  No follow-ups on file.      Enobong Newlin, MD, FAAFP. [facility] [place], [place] [phone]   02/04/2019, 2:26 PM 

## 2019-02-05 LAB — MICROALBUMIN / CREATININE URINE RATIO
Creatinine, Urine: 10 mg/dL
Microalb/Creat Ratio: 43 mg/g creat — ABNORMAL HIGH (ref 0–29)
Microalbumin, Urine: 4.3 ug/mL

## 2019-02-25 ENCOUNTER — Other Ambulatory Visit: Payer: Self-pay

## 2019-02-25 ENCOUNTER — Encounter (HOSPITAL_COMMUNITY): Payer: Self-pay | Admitting: Emergency Medicine

## 2019-02-25 ENCOUNTER — Emergency Department (HOSPITAL_COMMUNITY)
Admission: EM | Admit: 2019-02-25 | Discharge: 2019-02-25 | Disposition: A | Discharge status: Home / Self Care | Payer: No Typology Code available for payment source | Attending: Emergency Medicine | Admitting: Emergency Medicine

## 2019-02-25 DIAGNOSIS — M545 Low back pain, unspecified: Secondary | ICD-10-CM

## 2019-02-25 DIAGNOSIS — Y929 Unspecified place or not applicable: Secondary | ICD-10-CM | POA: Insufficient documentation

## 2019-02-25 DIAGNOSIS — Y939 Activity, unspecified: Secondary | ICD-10-CM | POA: Diagnosis not present

## 2019-02-25 DIAGNOSIS — Z7982 Long term (current) use of aspirin: Secondary | ICD-10-CM | POA: Diagnosis not present

## 2019-02-25 DIAGNOSIS — M542 Cervicalgia: Secondary | ICD-10-CM | POA: Diagnosis present

## 2019-02-25 DIAGNOSIS — Y999 Unspecified external cause status: Secondary | ICD-10-CM | POA: Diagnosis not present

## 2019-02-25 DIAGNOSIS — S161XXA Strain of muscle, fascia and tendon at neck level, initial encounter: Secondary | ICD-10-CM

## 2019-02-25 DIAGNOSIS — Z87891 Personal history of nicotine dependence: Secondary | ICD-10-CM | POA: Insufficient documentation

## 2019-02-25 MED ORDER — METHOCARBAMOL 500 MG PO TABS: 500.0000 mg | ORAL_TABLET | Freq: Two times a day (BID) | ORAL | 0 refills | Status: DC
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
## 2019-02-25 NOTE — Discharge Instructions (Addendum)
Please take Tylenol  and ibuprofen for your pain.  Please follow-up with your primary care doctor for evaluation your frequent urination that you have noticed.  Please return to ED if you have any new or concerning symptoms.  Please use warm compresses gentle stretches and massage.

## 2019-02-25 NOTE — ED Notes (Signed)
Patient verbalizes understanding of discharge instructions. Opportunity for questioning and answers were provided. Armband removed by staff, pt discharged from ED.  

## 2019-02-26 MED FILL — METHOCARBAMOL 500 MG TABS: 500 | 10 days supply | Qty: 20 | Fill #0

## 2019-02-27 ENCOUNTER — Emergency Department (HOSPITAL_COMMUNITY): Payer: Medicare Other

## 2019-02-27 ENCOUNTER — Emergency Department (HOSPITAL_COMMUNITY)
Admission: EM | Admit: 2019-02-27 | Discharge: 2019-02-27 | Disposition: A | Discharge status: Home / Self Care | Payer: Medicare Other | Attending: Emergency Medicine | Admitting: Emergency Medicine

## 2019-02-27 DIAGNOSIS — M545 Low back pain, unspecified: Secondary | ICD-10-CM

## 2019-02-27 DIAGNOSIS — E119 Type 2 diabetes mellitus without complications: Secondary | ICD-10-CM | POA: Diagnosis not present

## 2019-02-27 DIAGNOSIS — Y939 Activity, unspecified: Secondary | ICD-10-CM | POA: Insufficient documentation

## 2019-02-27 DIAGNOSIS — Z794 Long term (current) use of insulin: Secondary | ICD-10-CM | POA: Diagnosis not present

## 2019-02-27 DIAGNOSIS — M542 Cervicalgia: Secondary | ICD-10-CM | POA: Insufficient documentation

## 2019-02-27 DIAGNOSIS — I10 Essential (primary) hypertension: Secondary | ICD-10-CM | POA: Insufficient documentation

## 2019-02-27 DIAGNOSIS — Z87891 Personal history of nicotine dependence: Secondary | ICD-10-CM | POA: Insufficient documentation

## 2019-02-27 DIAGNOSIS — Y999 Unspecified external cause status: Secondary | ICD-10-CM | POA: Diagnosis not present

## 2019-02-27 DIAGNOSIS — Z7982 Long term (current) use of aspirin: Secondary | ICD-10-CM | POA: Insufficient documentation

## 2019-02-27 DIAGNOSIS — Y92410 Unspecified street and highway as the place of occurrence of the external cause: Secondary | ICD-10-CM | POA: Diagnosis not present

## 2019-02-27 DIAGNOSIS — Z79899 Other long term (current) drug therapy: Secondary | ICD-10-CM | POA: Diagnosis not present

## 2019-02-27 MED ORDER — ACETAMINOPHEN 500 MG PO TABS
1000.0000 mg | ORAL_TABLET | Freq: Once | ORAL | Status: AC
  Administered 2019-02-27: 16:00:00 1000 mg via ORAL
  Filled 2019-02-27: qty 2

## 2019-02-27 MED ORDER — LIDOCAINE 5 % EX PTCH
2.0000 | MEDICATED_PATCH | CUTANEOUS | Status: DC
  Administered 2019-02-27: 2 via TRANSDERMAL
  Filled 2019-02-27: qty 2
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
## 2019-02-27 NOTE — Discharge Instructions (Signed)
Please pick up the Robaxin that was prescribed to you a few days ago and begin taking this, it can cause drowsiness and do not take before driving.  You can also use Tylenol 1000 mg every 6 hours, as well as over-the-counter salon pas lidocaine patches, which can be worn for 12 hours and then remove for 12 hours.  Please follow-up with your primary care doctor if symptoms or not improving.

## 2019-03-04 ENCOUNTER — Other Ambulatory Visit: Payer: Self-pay

## 2019-03-04 ENCOUNTER — Encounter: Payer: Self-pay | Admitting: Family Medicine

## 2019-03-04 ENCOUNTER — Ambulatory Visit: Payer: Medicare Other | Attending: Family Medicine | Admitting: Family Medicine

## 2019-03-04 DIAGNOSIS — M549 Dorsalgia, unspecified: Secondary | ICD-10-CM

## 2019-03-04 MED ORDER — METHOCARBAMOL 500 MG PO TABS: 1000.0000 mg | ORAL_TABLET | Freq: Two times a day (BID) | ORAL | 2 refills | Status: DC

## 2019-03-04 MED FILL — METHOCARBAMOL 500 MG TABS: 500 | 30 days supply | Qty: 120 | Fill #0
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
## 2019-03-09 ENCOUNTER — Ambulatory Visit: Payer: Medicare Other | Admitting: Physical Therapy

## 2019-03-10 ENCOUNTER — Ambulatory Visit: Payer: Medicare Other | Attending: Family Medicine | Admitting: Physical Therapy

## 2019-03-10 ENCOUNTER — Encounter: Payer: Self-pay | Admitting: Physical Therapy

## 2019-03-10 ENCOUNTER — Other Ambulatory Visit: Payer: Self-pay

## 2019-03-10 DIAGNOSIS — R2689 Other abnormalities of gait and mobility: Secondary | ICD-10-CM | POA: Diagnosis not present

## 2019-03-10 DIAGNOSIS — R293 Abnormal posture: Secondary | ICD-10-CM | POA: Insufficient documentation

## 2019-03-10 DIAGNOSIS — M62830 Muscle spasm of back: Secondary | ICD-10-CM | POA: Insufficient documentation

## 2019-03-10 DIAGNOSIS — M545 Low back pain: Secondary | ICD-10-CM | POA: Insufficient documentation

## 2019-03-10 DIAGNOSIS — G8929 Other chronic pain: Secondary | ICD-10-CM | POA: Insufficient documentation
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
## 2019-03-12 ENCOUNTER — Emergency Department (HOSPITAL_COMMUNITY)
Admission: EM | Admit: 2019-03-12 | Discharge: 2019-03-12 | Disposition: A | Discharge status: Home / Self Care | Payer: Medicare Other | Attending: Emergency Medicine | Admitting: Emergency Medicine

## 2019-03-12 ENCOUNTER — Encounter (HOSPITAL_COMMUNITY): Payer: Self-pay | Admitting: Emergency Medicine

## 2019-03-12 ENCOUNTER — Emergency Department (HOSPITAL_COMMUNITY): Payer: Medicare Other

## 2019-03-12 ENCOUNTER — Ambulatory Visit: Payer: Medicare Other | Admitting: Physical Therapy

## 2019-03-12 ENCOUNTER — Other Ambulatory Visit: Payer: Self-pay

## 2019-03-12 DIAGNOSIS — M1612 Unilateral primary osteoarthritis, left hip: Secondary | ICD-10-CM | POA: Diagnosis not present

## 2019-03-12 DIAGNOSIS — M25552 Pain in left hip: Secondary | ICD-10-CM | POA: Insufficient documentation

## 2019-03-12 MED ORDER — LIDOCAINE 5 % EX PTCH: 1.0000 | MEDICATED_PATCH | CUTANEOUS | 0 refills | Status: DC

## 2019-03-12 MED ORDER — KETOROLAC TROMETHAMINE 15 MG/ML IJ SOLN
15.0000 mg | Freq: Once | INTRAMUSCULAR | Status: AC
  Administered 2019-03-12: 15 mg via INTRAMUSCULAR
  Filled 2019-03-12: qty 1

## 2019-03-12 MED FILL — LIDOCAINE 5 % PTCH: 5 | 30 days supply | Qty: 30 | Fill #0

## 2019-03-12 NOTE — Discharge Instructions (Addendum)
Continue taking home medications as prescribed. Use the Lidoderm patch to help with pain control or as needed for severe breakthrough pain. It is important you continue to move your hip/leg.  Continue to follow-up with the physical therapist as scheduled. Follow-up with your orthopedic doctor as needed for continued symptoms. Take Tylenol as needed for breakthrough pain. Return to the emergency room if you develop fevers, numbness in your leg, or any new, worsening, concerning symptoms.

## 2019-03-12 NOTE — ED Triage Notes (Signed)
Pt in for L hip pain, worsened x 1 wk. States he has been more unsteady in L leg d/t pain, tripped and fell last night, hit R forehead, no LOC. Ambulated with cane upon triage

## 2019-03-12 NOTE — ED Notes (Signed)
Pt transported to xray 
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
## 2019-03-18 ENCOUNTER — Ambulatory Visit: Payer: Medicare Other | Admitting: Orthopaedic Surgery

## 2019-03-20 ENCOUNTER — Ambulatory Visit: Payer: No Typology Code available for payment source | Attending: Family Medicine | Admitting: Physical Therapy

## 2019-03-20 ENCOUNTER — Encounter: Payer: Self-pay | Admitting: Physical Therapy

## 2019-03-20 ENCOUNTER — Other Ambulatory Visit: Payer: Self-pay

## 2019-03-20 DIAGNOSIS — M25651 Stiffness of right hip, not elsewhere classified: Secondary | ICD-10-CM | POA: Diagnosis not present

## 2019-03-20 DIAGNOSIS — R293 Abnormal posture: Secondary | ICD-10-CM | POA: Diagnosis not present

## 2019-03-20 DIAGNOSIS — M25652 Stiffness of left hip, not elsewhere classified: Secondary | ICD-10-CM | POA: Diagnosis not present

## 2019-03-20 DIAGNOSIS — M62830 Muscle spasm of back: Secondary | ICD-10-CM | POA: Diagnosis not present

## 2019-03-20 DIAGNOSIS — G8929 Other chronic pain: Secondary | ICD-10-CM | POA: Diagnosis not present

## 2019-03-20 DIAGNOSIS — R2689 Other abnormalities of gait and mobility: Secondary | ICD-10-CM | POA: Insufficient documentation

## 2019-03-20 DIAGNOSIS — M545 Low back pain: Secondary | ICD-10-CM | POA: Diagnosis not present

## 2019-03-20 NOTE — Therapy (Signed)
[place], [place], [address] Phone: [phone]   Fax:  [phone]  Physical Therapy Treatment  Patient Details  Name: Brandon Finley MRN: [id number] Date of Birth: 1960/12/21 Referring Provider (PT): Charlott Rakes, MD    Encounter Date: 03/20/2019  PT End of Session - 03/20/19 0747    Visit Number  2    Number of Visits  12    Date for PT Re-Evaluation  04/21/19    Authorization Type  Med pay with MCR secondary - KX mod at 15th visit, progress note at 10th visit    PT Start Time  0747    PT Stop Time  0830    PT Time Calculation (min)  43 min    Activity Tolerance  Patient tolerated treatment well    Behavior During Therapy  [facility] for tasks assessed/performed       Past Medical History:  Diagnosis Date  . Arthritis   . Hypertension     Past Surgical History:  Procedure Laterality Date  . TOTAL HIP ARTHROPLASTY Right 03/13/2016   Procedure: RIGHT TOTAL HIP ARTHROPLASTY ANTERIOR APPROACH;  Surgeon: Mcarthur Rossetti, MD;  Location: [place];  Service: Orthopedics;  Laterality: Right;    There were no vitals filed for this visit.  Subjective Assessment - 03/20/19 0750    Subjective  "I did go to the ED after the last session due to increased. Today I am doing better with pain at 5/10"    Currently in Pain?  Yes    Pain Score  5     Pain Location  Hip    Pain Orientation  Left    Pain Descriptors / Indicators  Aching;Tightness    Pain Type  Chronic pain    Pain Onset  More than a month ago    Pain Frequency  Intermittent    Aggravating Factors   getting out of bed, prolonged walking/ standing         [facility] PT Assessment - 03/20/19 0001      Assessment   Medical Diagnosis  Musculoskeletal back pain     Referring Provider (PT)  Charlott Rakes, MD                    [facility] Adult PT Treatment/Exercise - 03/20/19 0001      Self-Care   Self-Care  Posture    Posture   supine <> sit biomechanics wit proper form    demonstration and cues for proper form.     Exercises   Exercises  Lumbar;Knee/Hip      Lumbar Exercises: Stretches   Active Hamstring Stretch  3 reps;30 seconds;Left   PNF contract/ relax   Lower Trunk Rotation Limitations  2 x 10      Lumbar Exercises: Aerobic   Nustep  L5 x 5 min UE/LE       Lumbar Exercises: Supine   Bent Knee Raise  --    Bent Knee Raise Limitations  --      Knee/Hip Exercises: Standing   Hip Flexion  Stengthening;Both;2 sets;15 reps   with bil HHA from counter for stability   Hip Flexion Limitations  cues to avoid letting    Step Down  2 sets;10 reps;Both;Step Height: 4"      Knee/Hip Exercises: Supine   Straight Leg Raises  2 sets;Left;10 reps   fatigues quickly     Manual Therapy   Manual Therapy  Joint mobilization  Manual therapy comments  MTPR along the hamstrings x 2 and L lumbar paraspinals    Joint Mobilization  grade II-III LAD LLE only    Muscle Energy Technique  resisted R hip flexor MET 5 x 10 second hold             PT Education - 03/20/19 0807    Education Details  reviewed HEP. pt states he feels he would be more mobile with a rollator, discussed if he would like to get his ins to cover he needs to get script from his MD.    Terence Lux) Educated  Patient    Methods  Explanation;Verbal cues;Handout    Comprehension  Verbalized understanding;Verbal cues required       PT Short Term Goals - 03/10/19 0946      PT SHORT TERM GOAL #1   Title  pt to be I with inital HEP    Time  3    Period  Weeks    Status  New    Target Date  03/31/19      PT SHORT TERM GOAL #2   Title  He will demo understanding of good sitting / standing posture to reduce and prevent back/ hip pain    Time  3    Period  Weeks    Status  New      PT SHORT TERM GOAL #3   Title  -        PT Long Term Goals - 03/10/19 0946      PT LONG TERM GOAL #1   Title  pt to be able to maintain functional trunk  mobility with </= 2/10 pain for functional mobility required for ADLs    Time  6    Period  Weeks    Status  New    Target Date  04/21/19      PT LONG TERM GOAL #2   Title  pt to be able to stand and walk for >/= 30 min with </= 2/10 pain with LRAD for in home and community ambulation    Time  6    Period  Weeks    Status  New    Target Date  04/21/19      PT LONG TERM GOAL #3   Title  pt to return to walking regimen per pt's perosnal goals to get back to walking for exercise    Time  6    Period  Weeks    Status  New    Target Date  04/21/19      PT LONG TERM GOAL #4   Title  improve FOTO score to </=38% limited to demo improvement in function    Time  6    Period  Weeks    Status  New    Target Date  04/21/19      PT LONG TERM GOAL #5   Title  pt to be I with all HEP given as of last visit to maintain and progress current level of function    Time  6    Period  Weeks    Status  New    Target Date  04/21/19            Plan - 03/20/19 0829    Clinical Impression Statement  pt reports going to the 1-2 days following his evaluation due to pain in the hip and back. reviewed HEP and continued hmstring strething and hp flexor strengthening/ METs. He was able to  do all exercise with intermittent cues for proper form/ rationale. practiced log rolling to reduce tension/ torque on the back. He reported decreased pain at end of session.    PT Treatment/Interventions  Manual techniques;Dry needling;Passive range of motion;Patient/family education;Therapeutic activities;Therapeutic exercise;Gait training;Moist Heat;Iontophoresis 102m/ml Dexamethasone;ADLs/Self Care Home Management;Cryotherapy;Electrical Stimulation;Traction;Ultrasound;Stair training;Balance training;Taping    PT Next Visit Plan  review/ update HEP PRN, hamstring stretching, hip flexor activation, hip gentle LAD grade I-II RLE. core strengthening, gait training with AD. low back stretch    PT Home Exercise Plan   [id number] - LTR, supine/ seated hamstring stretch, hip flexor MET, standing marching with bil HHA, log rolling    Consulted and Agree with Plan of Care  Patient       Patient will benefit from skilled therapeutic intervention in order to improve the following deficits and impairments:  Improper body mechanics, Increased muscle spasms, Decreased strength, Abnormal gait, Pain, Obesity, Postural dysfunction, Decreased balance, Decreased endurance, Decreased activity tolerance  Visit Diagnosis: Chronic right-sided low back pain without sciatica  Abnormal posture  Muscle spasm of back     Problem List Patient Active Problem List   Diagnosis Date Noted  . Hyperlipidemia 07/10/2018  . Hypertension 06/22/2016  . 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 Post-traumatic osteoarthritis of right hip 09/14/2015  . Right hip pain 09/14/2015  . Type 2 diabetes mellitus ([place] 07/25/2012  . Morbid obesity ([place] 07/25/2012   KStarr LakePT, DPT, LAT, ATC  03/20/19  8:33 AM      [facility] [address]Phone: [phone]  Fax:  [phone] Name: Brandon BUTCHERMRN: [id number] of Birth: [birth year]

## 2019-03-23 ENCOUNTER — Encounter: Payer: Self-pay | Admitting: Physical Therapy

## 2019-03-23 ENCOUNTER — Ambulatory Visit: Payer: No Typology Code available for payment source | Admitting: Physical Therapy

## 2019-03-23 ENCOUNTER — Other Ambulatory Visit: Payer: Self-pay

## 2019-03-23 DIAGNOSIS — R293 Abnormal posture: Secondary | ICD-10-CM | POA: Diagnosis not present

## 2019-03-23 DIAGNOSIS — G8929 Other chronic pain: Secondary | ICD-10-CM | POA: Diagnosis not present

## 2019-03-23 DIAGNOSIS — M62830 Muscle spasm of back: Secondary | ICD-10-CM | POA: Diagnosis not present

## 2019-03-23 DIAGNOSIS — M25652 Stiffness of left hip, not elsewhere classified: Secondary | ICD-10-CM | POA: Diagnosis not present

## 2019-03-23 DIAGNOSIS — R2689 Other abnormalities of gait and mobility: Secondary | ICD-10-CM | POA: Diagnosis not present

## 2019-03-23 DIAGNOSIS — M545 Low back pain: Secondary | ICD-10-CM | POA: Diagnosis not present

## 2019-03-23 DIAGNOSIS — M25651 Stiffness of right hip, not elsewhere classified: Secondary | ICD-10-CM | POA: Diagnosis not present

## 2019-03-23 NOTE — Therapy (Signed)
[place], [place], [address] Phone: [phone]   Fax:  [phone]  Physical Therapy Treatment  Patient Details  Name: Brandon Finley MRN: [id number] Date of Birth: [birth year] Referring Provider (PT): Charlott Rakes, MD    Encounter Date: 03/23/2019  PT End of Session - 03/23/19 0800    Visit Number  3    Number of Visits  12    Date for PT Re-Evaluation  04/21/19    Authorization Type  Med pay with MCR secondary - KX mod at 15th visit,    Progress Note Due on Visit  10    PT Start Time  0800    PT Stop Time  0845    PT Time Calculation (min)  45 min    Activity Tolerance  Patient tolerated treatment well    Behavior During Therapy  [facility] for tasks assessed/performed       Past Medical History:  Diagnosis Date  . Arthritis   . Hypertension     Past Surgical History:  Procedure Laterality Date  . TOTAL HIP ARTHROPLASTY Right 03/13/2016   Procedure: RIGHT TOTAL HIP ARTHROPLASTY ANTERIOR APPROACH;  Surgeon: Mcarthur Rossetti, MD;  Location: [place];  Service: Orthopedics;  Laterality: Right;    There were no vitals filed for this visit.  Subjective Assessment - 03/23/19 0803    Subjective  " I am doing better, I feel like I am able to get out of bed without as much difficulty    Currently in Pain?  Yes    Pain Score  4     Pain Location  Hip    Pain Orientation  Left    Pain Descriptors / Indicators  Aching;Sore    Pain Onset  More than a month ago    Pain Frequency  Intermittent                       [facility] Adult PT Treatment/Exercise - 03/23/19 0805      Therapeutic Activites    Therapeutic Activities  Other Therapeutic Activities    Other Therapeutic Activities  log rolling from supine<> sit to the R x  5 and to the L x 5   intermittent cues for proper form     Lumbar Exercises: Stretches   Passive Hamstring Stretch  3 reps;30 seconds   with strap   Lower Trunk  Rotation Limitations  1 x 10 holding 5 sec e[address]. way.      Lumbar Exercises: Aerobic   Nustep  L5 x 6 min UE/LE    keeping MET between 3.0-4.0     Knee/Hip Exercises: Seated   Sit to Sand  1 set;10 reps;without UE support   with 3 second isometric squeeze     Knee/Hip Exercises: Supine   Straight Leg Raises  Strengthening;2 sets;15 reps      Manual Therapy   Manual therapy comments  MTPR along  L lumbar paraspinals x 3    Joint Mobilization  grade II-III LAD LLE only             PT Education - 03/23/19 0848    Education Details  updated HEP for sit to stand, reviewed log rolling techniques. discussed benefit of practicing walking with [facility] working on endurance training gradually increasing time.    Person(s) Educated  Patient    Methods  Explanation;Verbal cues;Handout    Comprehension  Verbalized understanding;Verbal cues required  PT Short Term Goals - 03/10/19 0946      PT SHORT TERM GOAL #1   Title  pt to be I with inital HEP    Time  3    Period  Weeks    Status  New    Target Date  03/31/19      PT SHORT TERM GOAL #2   Title  He will demo understanding of good sitting / standing posture to reduce and prevent back/ hip pain    Time  3    Period  Weeks    Status  New      PT SHORT TERM GOAL #3   Title  -        PT Long Term Goals - 03/10/19 0946      PT LONG TERM GOAL #1   Title  pt to be able to maintain functional trunk mobility with </= 2/10 pain for functional mobility required for ADLs    Time  6    Period  Weeks    Status  New    Target Date  04/21/19      PT LONG TERM GOAL #2   Title  pt to be able to stand and walk for >/= 30 min with </= 2/10 pain with LRAD for in home and community ambulation    Time  6    Period  Weeks    Status  New    Target Date  04/21/19      PT LONG TERM GOAL #3   Title  pt to return to walking regimen per pt's perosnal goals to get back to walking for exercise    Time  6    Period  Weeks    Status  New     Target Date  04/21/19      PT LONG TERM GOAL #4   Title  improve FOTO score to </=38% limited to demo improvement in function    Time  6    Period  Weeks    Status  New    Target Date  04/21/19      PT LONG TERM GOAL #5   Title  pt to be I with all HEP given as of last visit to maintain and progress current level of function    Time  6    Period  Weeks    Status  New    Target Date  04/21/19            Plan - 03/23/19 0850    Clinical Impression Statement  pt reports continuing improvement with reduced pain today at 4/10. continued hamstring stretching and quad strengthening to help promote SIJ positioning. Continued hip strengthening functional bed mobility which he did well with intermittent verbal cues. discussed pros and cons of getting a rollator.    PT Treatment/Interventions  Manual techniques;Dry needling;Passive range of motion;Patient/family education;Therapeutic activities;Therapeutic exercise;Gait training;Moist Heat;Iontophoresis 30m/ml Dexamethasone;ADLs/Self Care Home Management;Cryotherapy;Electrical Stimulation;Traction;Ultrasound;Stair training;Balance training;Taping    PT Next Visit Plan  review/ update HEP PRN, hamstring stretching, hip flexor activation, hip gentle LAD grade I-II RLE. core strengthening, gait training with AD. low back stretch    PT Home Exercise Plan  [id number] - LTR, supine/ seated hamstring stretch, hip flexor MET, standing marching with bil HHA, log rolling, sit to stand with hands on knees.    Consulted and Agree with Plan of Care  Patient       Patient will benefit from skilled therapeutic intervention in order to  improve the following deficits and impairments:  Improper body mechanics, Increased muscle spasms, Decreased strength, Abnormal gait, Pain, Obesity, Postural dysfunction, Decreased balance, Decreased endurance, Decreased activity tolerance  Visit Diagnosis: Chronic right-sided low back pain without sciatica  Abnormal  posture  Muscle spasm of back  Other abnormalities of gait and mobility     Problem List Patient Active Problem List   Diagnosis Date Noted  . Hyperlipidemia 07/10/2018  . Hypertension 06/22/2016  . 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 Post-traumatic osteoarthritis of right hip 09/14/2015  . Right hip pain 09/14/2015  . Type 2 diabetes mellitus ([place]) 07/25/2012  . Morbid obesity ([place]) 07/25/2012   Starr Lake PT, DPT, LAT, ATC  03/23/19  8:56 AM      [facility] [address] [place], [place], [address] Phone: [phone]   Fax:  [phone]  Name: RUSTIN ERHART MRN: [id number] Date of Birth: 1960/07/10

## 2019-03-25 ENCOUNTER — Ambulatory Visit: Payer: No Typology Code available for payment source | Admitting: Physical Therapy

## 2019-03-25 ENCOUNTER — Encounter: Payer: Self-pay | Admitting: Physical Therapy

## 2019-03-25 ENCOUNTER — Other Ambulatory Visit: Payer: Self-pay

## 2019-03-25 DIAGNOSIS — M62830 Muscle spasm of back: Secondary | ICD-10-CM

## 2019-03-25 DIAGNOSIS — G8929 Other chronic pain: Secondary | ICD-10-CM

## 2019-03-25 DIAGNOSIS — R293 Abnormal posture: Secondary | ICD-10-CM | POA: Diagnosis not present

## 2019-03-25 DIAGNOSIS — R2689 Other abnormalities of gait and mobility: Secondary | ICD-10-CM

## 2019-03-25 DIAGNOSIS — M545 Low back pain, unspecified: Secondary | ICD-10-CM

## 2019-03-25 DIAGNOSIS — M25652 Stiffness of left hip, not elsewhere classified: Secondary | ICD-10-CM | POA: Diagnosis not present

## 2019-03-25 DIAGNOSIS — M25651 Stiffness of right hip, not elsewhere classified: Secondary | ICD-10-CM | POA: Diagnosis not present

## 2019-03-25 NOTE — Therapy (Signed)
[place], [place], [address] Phone: [phone]   Fax:  [phone]  Physical Therapy Treatment  Patient Details  Name: Brandon Finley MRN: [id number] Date of Birth: 1960/08/06 Referring Provider (PT): Charlott Rakes, MD    Encounter Date: 03/25/2019  PT End of Session - 03/25/19 0800    Visit Number  4    Number of Visits  12    Date for PT Re-Evaluation  04/21/19    Authorization Type  Med pay with MCR secondary - KX mod at 15th visit,    Progress Note Due on Visit  10    PT Start Time  0800    PT Stop Time  0840    PT Time Calculation (min)  40 min    Activity Tolerance  Patient tolerated treatment well    Behavior During Therapy  [facility] for tasks assessed/performed       Past Medical History:  Diagnosis Date  . Arthritis   . Hypertension     Past Surgical History:  Procedure Laterality Date  . TOTAL HIP ARTHROPLASTY Right 03/13/2016   Procedure: RIGHT TOTAL HIP ARTHROPLASTY ANTERIOR APPROACH;  Surgeon: Mcarthur Rossetti, MD;  Location: [place];  Service: Orthopedics;  Laterality: Right;    There were no vitals filed for this visit.  Subjective Assessment - 03/25/19 0802    Subjective  "I am doing better, I am having no pain in the back and only having soreness in the hips"    Currently in Pain?  Yes    Pain Score  0-No pain    Pain Location  Back         [facility] PT Assessment - 03/25/19 0001      Assessment   Medical Diagnosis  Musculoskeletal back pain     Referring Provider (PT)  Charlott Rakes, MD                    [facility] Adult PT Treatment/Exercise - 03/25/19 0806      Lumbar Exercises: Aerobic   Tread Mill  1.0 x 5 min   halted at 3 min due to diffuclty with heel strike/ toe off     Knee/Hip Exercises: Standing   Hip Abduction  2 sets;10 reps;Knee straight;Both;Stengthening    Hip Extension  Stengthening;2 sets;10 reps;Knee straight    Gait Training   heel strike and toe off with exaggerating 2 x 185 ft with [facility]   demonstration for proper form     Knee/Hip Exercises: Seated   Long Arc Quad  2 sets;10 reps;Weights    Long Arc Quad Weight  4 lbs.    Marching  2 sets;15 reps   4#   Sit to [employer]  2 sets;10 reps   with glute set             PT Education - 03/25/19 0830    Education Details  reviewed benefits of getting with a nutritionist to help with weight loss and proper eating habits.    Person(s) Educated  Patient    Methods  Explanation;Verbal cues    Comprehension  Verbalized understanding;Verbal cues required       PT Short Term Goals - 03/10/19 0946      PT SHORT TERM GOAL #1   Title  pt to be I with inital HEP    Time  3    Period  Weeks    Status  New    Target Date  03/31/19      PT SHORT TERM GOAL #2   Title  He will demo understanding of good sitting / standing posture to reduce and prevent back/ hip pain    Time  3    Period  Weeks    Status  New      PT SHORT TERM GOAL #3   Title  -        PT Long Term Goals - 03/10/19 0946      PT LONG TERM GOAL #1   Title  pt to be able to maintain functional trunk mobility with </= 2/10 pain for functional mobility required for ADLs    Time  6    Period  Weeks    Status  New    Target Date  04/21/19      PT LONG TERM GOAL #2   Title  pt to be able to stand and walk for >/= 30 min with </= 2/10 pain with LRAD for in home and community ambulation    Time  6    Period  Weeks    Status  New    Target Date  04/21/19      PT LONG TERM GOAL #3   Title  pt to return to walking regimen per pt's perosnal goals to get back to walking for exercise    Time  6    Period  Weeks    Status  New    Target Date  04/21/19      PT LONG TERM GOAL #4   Title  improve FOTO score to </=38% limited to demo improvement in function    Time  6    Period  Weeks    Status  New    Target Date  04/21/19      PT LONG TERM GOAL #5   Title  pt to be I with all HEP given as  of last visit to maintain and progress current level of function    Time  6    Period  Weeks    Status  New    Target Date  04/21/19            Plan - 03/25/19 0831    Clinical Impression Statement  pt rpeorts improvement inback pain today but reports soreness inthe hips. focused session on hip /knee strengthening and gait training. he required frequent cues for proper gait which he did better with practiced. He did well with all exercises with no increased in pain or issues today.    PT Treatment/Interventions  Manual techniques;Dry needling;Passive range of motion;Patient/family education;Therapeutic activities;Therapeutic exercise;Gait training;Moist Heat;Iontophoresis 42m/ml Dexamethasone;ADLs/Self Care Home Management;Cryotherapy;Electrical Stimulation;Traction;Ultrasound;Stair training;Balance training;Taping    PT Next Visit Plan  review/ update HEP PRN, hamstring stretching, hip flexor activation, hip gentle LAD grade I-II RLE. core strengthening, gait training with AD. low back stretch    PT Home Exercise Plan  [id number] - LTR, supine/ seated hamstring stretch, hip flexor MET, standing marching with bil HHA, log rolling, sit to stand with hands on knees.    Consulted and Agree with Plan of Care  Patient       Patient will benefit from skilled therapeutic intervention in order to improve the following deficits and impairments:  Improper body mechanics, Increased muscle spasms, Decreased strength, Abnormal gait, Pain, Obesity, Postural dysfunction, Decreased balance, Decreased endurance, Decreased activity tolerance  Visit Diagnosis: Chronic right-sided low back pain without sciatica  Abnormal posture  Muscle spasm of back  Other  abnormalities of gait and mobility     Problem List Patient Active Problem List   Diagnosis Date Noted  . Hyperlipidemia 07/10/2018  . Hypertension 06/22/2016  . 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 Post-traumatic  osteoarthritis of right hip 09/14/2015  . Right hip pain 09/14/2015  . Type 2 diabetes mellitus ([place]) 07/25/2012  . Morbid obesity ([place]) 07/25/2012   Starr Lake PT, DPT, LAT, ATC  03/25/19  8:42 AM      [facility] [address] [place], [place], [address] Phone: [phone]   Fax:  [phone]  Name: Brandon Finley MRN: [id number] Date of Birth: [birth year]

## 2019-03-30 ENCOUNTER — Ambulatory Visit: Payer: No Typology Code available for payment source | Admitting: Physical Therapy

## 2019-03-30 ENCOUNTER — Other Ambulatory Visit: Payer: Self-pay

## 2019-03-30 ENCOUNTER — Ambulatory Visit: Payer: Medicare Other | Admitting: Orthopaedic Surgery

## 2019-03-30 DIAGNOSIS — G8929 Other chronic pain: Secondary | ICD-10-CM | POA: Diagnosis not present

## 2019-03-30 DIAGNOSIS — R2689 Other abnormalities of gait and mobility: Secondary | ICD-10-CM

## 2019-03-30 DIAGNOSIS — M62830 Muscle spasm of back: Secondary | ICD-10-CM

## 2019-03-30 DIAGNOSIS — M545 Low back pain: Secondary | ICD-10-CM | POA: Diagnosis not present

## 2019-03-30 DIAGNOSIS — R293 Abnormal posture: Secondary | ICD-10-CM | POA: Diagnosis not present

## 2019-03-30 DIAGNOSIS — M25652 Stiffness of left hip, not elsewhere classified: Secondary | ICD-10-CM | POA: Diagnosis not present

## 2019-03-30 DIAGNOSIS — M25651 Stiffness of right hip, not elsewhere classified: Secondary | ICD-10-CM | POA: Diagnosis not present

## 2019-03-30 NOTE — Therapy (Signed)
[place], [place], [address] Phone: [phone]   Fax:  [phone]  Physical Therapy Treatment  Patient Details  Name: Brandon Finley MRN: [id number] Date of Birth: [birth year] Referring Provider (PT): Charlott Rakes, MD    Encounter Date: 03/30/2019  PT End of Session - 03/30/19 0801    Visit Number  5    Number of Visits  12    Date for PT Re-Evaluation  04/21/19    Authorization Type  Med pay with MCR secondary - KX mod at 15th visit,    PT Start Time  0801    PT Stop Time  0840    PT Time Calculation (min)  39 min    Activity Tolerance  Patient tolerated treatment well    Behavior During Therapy  [facility] for tasks assessed/performed       Past Medical History:  Diagnosis Date  . Arthritis   . Hypertension     Past Surgical History:  Procedure Laterality Date  . TOTAL HIP ARTHROPLASTY Right 03/13/2016   Procedure: RIGHT TOTAL HIP ARTHROPLASTY ANTERIOR APPROACH;  Surgeon: Mcarthur Rossetti, MD;  Location: [place];  Service: Orthopedics;  Laterality: Right;    There were no vitals filed for this visit.  Subjective Assessment - 03/30/19 0801    Subjective  "I am still feeling like I am making progress today"    Currently in Pain?  No/denies    Aggravating Factors   getting of bed in the AM         [facility] PT Assessment - 03/30/19 0001      Assessment   Medical Diagnosis  Musculoskeletal back pain     Referring Provider (PT)  Charlott Rakes, MD       6 Minute Walk- Baseline   6 Minute Walk- Baseline  yes      6 minute walk test results    Aerobic Endurance Distance Walked  854    Endurance additional comments  pt had to take 2 standing breaks    used [facility] throughout session                  [facility] Adult PT Treatment/Exercise - 03/30/19 0001      Lumbar Exercises: Stretches   Passive Hamstring Stretch  2 reps;30 seconds;Left    Hip Flexor Stretch  Left;Right;2  reps;30 seconds   standing     Lumbar Exercises: Aerobic   Nustep  L4 x 4 min LE only      Knee/Hip Exercises: Standing   Hip Abduction  2 sets;Knee straight;Both;Stengthening;15 reps    Hip Extension  Stengthening;2 sets      Knee/Hip Exercises: Seated   Long Arc Quad  2 sets;20 reps;Left;Right    Long Arc Quad Weight  4 lbs.    Marching  2 sets;Both   x 30 reps alternating L/R    Marching Weights  4 lbs.    Sit to Sand  1 set;20 reps   cues for controlled lowering              PT Short Term Goals - 03/10/19 0946      PT SHORT TERM GOAL #1   Title  pt to be I with inital HEP    Time  3    Period  Weeks    Status  New    Target Date  03/31/19      PT SHORT TERM GOAL #2   Title  He will demo understanding of good sitting / standing posture to reduce and prevent back/ hip pain    Time  3    Period  Weeks    Status  New      PT SHORT TERM GOAL #3   Title  -        PT Long Term Goals - 03/10/19 0946      PT LONG TERM GOAL #1   Title  pt to be able to maintain functional trunk mobility with </= 2/10 pain for functional mobility required for ADLs    Time  6    Period  Weeks    Status  New    Target Date  04/21/19      PT LONG TERM GOAL #2   Title  pt to be able to stand and walk for >/= 30 min with </= 2/10 pain with LRAD for in home and community ambulation    Time  6    Period  Weeks    Status  New    Target Date  04/21/19      PT LONG TERM GOAL #3   Title  pt to return to walking regimen per pt's perosnal goals to get back to walking for exercise    Time  6    Period  Weeks    Status  New    Target Date  04/21/19      PT LONG TERM GOAL #4   Title  improve FOTO score to </=38% limited to demo improvement in function    Time  6    Period  Weeks    Status  New    Target Date  04/21/19      PT LONG TERM GOAL #5   Title  pt to be I with all HEP given as of last visit to maintain and progress current level of function    Time  6    Period   Weeks    Status  New    Target Date  04/21/19            Plan - 03/30/19 0829    Clinical Impression Statement  pt reports no pain today but does not he is feeling increased stiffness in the low back/ hips. pt amb 854 ft during 6 min walk test but did have to take 2 standing breaks. continued working on hip/ knee strength with emphasis on endurance training. pt reported feeling better end of session and had decreased stiffness.    PT Treatment/Interventions  Manual techniques;Dry needling;Passive range of motion;Patient/family education;Therapeutic activities;Therapeutic exercise;Gait training;Moist Heat;Iontophoresis 31m/ml Dexamethasone;ADLs/Self Care Home Management;Cryotherapy;Electrical Stimulation;Traction;Ultrasound;Stair training;Balance training;Taping    PT Next Visit Plan  review/ update HEP PRN, hamstring stretching, hip flexor activation, hip gentle LAD grade I-II RLE. core strengthening, gait training with AD.    PT Home Exercise Plan  [id number] - LTR, supine/ seated hamstring stretch, hip flexor MET, standing marching with bil HHA, log rolling, sit to stand with hands on knees.    Consulted and Agree with Plan of Care  Patient       Patient will benefit from skilled therapeutic intervention in order to improve the following deficits and impairments:  Improper body mechanics, Increased muscle spasms, Decreased strength, Abnormal gait, Pain, Obesity, Postural dysfunction, Decreased balance, Decreased endurance, Decreased activity tolerance  Visit Diagnosis: Chronic right-sided low back pain without sciatica  Abnormal posture  Muscle spasm of back  Other abnormalities of gait and mobility  Problem List Patient Active Problem List   Diagnosis Date Noted  . Hyperlipidemia 07/10/2018  . Hypertension 06/22/2016  . 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 Post-traumatic osteoarthritis of right hip 09/14/2015  . Right hip pain 09/14/2015  . Type 2  diabetes mellitus ([place]) 07/25/2012  . Morbid obesity ([place]) 07/25/2012   Starr Lake PT, DPT, LAT, ATC  03/30/19  8:41 AM      [facility] [address] [place], [place], [address] Phone: [phone]   Fax:  [phone]  Name: Brandon Finley MRN: [id number] Date of Birth: [birth year]

## 2019-04-01 ENCOUNTER — Encounter: Payer: Self-pay | Admitting: Physical Therapy

## 2019-04-01 ENCOUNTER — Other Ambulatory Visit: Payer: Self-pay

## 2019-04-01 ENCOUNTER — Ambulatory Visit: Payer: No Typology Code available for payment source | Admitting: Physical Therapy

## 2019-04-01 DIAGNOSIS — G8929 Other chronic pain: Secondary | ICD-10-CM

## 2019-04-01 DIAGNOSIS — R293 Abnormal posture: Secondary | ICD-10-CM

## 2019-04-01 DIAGNOSIS — R2689 Other abnormalities of gait and mobility: Secondary | ICD-10-CM

## 2019-04-01 DIAGNOSIS — M62830 Muscle spasm of back: Secondary | ICD-10-CM

## 2019-04-01 NOTE — Therapy (Signed)
[place], [place], [address] Phone: [phone]   Fax:  [phone]  Physical Therapy Treatment  Patient Details  Name: Brandon Finley MRN: [id number] Date of Birth: 21-Nov-1960 Referring Provider (PT): Charlott Rakes, MD    Encounter Date: 04/01/2019  PT End of Session - 04/01/19 0800    Visit Number  6    Number of Visits  12    Date for PT Re-Evaluation  04/21/19    Authorization Type  Med pay with MCR secondary - KX mod at 15th visit,    PT Start Time  0800    PT Stop Time  0845    PT Time Calculation (min)  45 min    Activity Tolerance  Patient tolerated treatment well    Behavior During Therapy  [facility] for tasks assessed/performed       Past Medical History:  Diagnosis Date  . Arthritis   . Hypertension     Past Surgical History:  Procedure Laterality Date  . TOTAL HIP ARTHROPLASTY Right 03/13/2016   Procedure: RIGHT TOTAL HIP ARTHROPLASTY ANTERIOR APPROACH;  Surgeon: Mcarthur Rossetti, MD;  Location: [place];  Service: Orthopedics;  Laterality: Right;    There were no vitals filed for this visit.  Subjective Assessment - 04/01/19 0811    Subjective  "I feel like I am doing pretty good"    Currently in Pain?  No/denies    Aggravating Factors   N/A    Pain Relieving Factors  exercise         [facility] PT Assessment - 04/01/19 0001      Observation/Other Assessments   Focus on Therapeutic Outcomes (FOTO)   33% limited                   [facility] Adult PT Treatment/Exercise - 04/01/19 0001      Lumbar Exercises: Aerobic   Nustep  L5 x 6 min LE only   increased time for endurance     Knee/Hip Exercises: Stretches   Gastroc Stretch  2 reps;30 seconds;Left   slant board     Knee/Hip Exercises: Standing   Stairs  5 x up/ down 6 inch stairs with SPC     Other Standing Knee Exercises  dead lift 2 x 10 10# kettlebell from 8 inch step      Knee/Hip Exercises: Seated    Long Arc Quad  2 sets;20 reps;Left;Right    Long Arc [employer]  5 lbs.    Marching  Strengthening;2 sets;20 reps;Weights    Marching Weights  4 lbs.    Sit to [employer]  --               PT Short Term Goals - 03/10/19 0946      PT SHORT TERM GOAL #1   Title  pt to be I with inital HEP    Time  3    Period  Weeks    Status  New    Target Date  03/31/19      PT SHORT TERM GOAL #2   Title  He will demo understanding of good sitting / standing posture to reduce and prevent back/ hip pain    Time  3    Period  Weeks    Status  New      PT SHORT TERM GOAL #3   Title  -        PT Long Term Goals - 03/10/19 4888  PT LONG TERM GOAL #1   Title  pt to be able to maintain functional trunk mobility with </= 2/10 pain for functional mobility required for ADLs    Time  6    Period  Weeks    Status  New    Target Date  04/21/19      PT LONG TERM GOAL #2   Title  pt to be able to stand and walk for >/= 30 min with </= 2/10 pain with LRAD for in home and community ambulation    Time  6    Period  Weeks    Status  New    Target Date  04/21/19      PT LONG TERM GOAL #3   Title  pt to return to walking regimen per pt's perosnal goals to get back to walking for exercise    Time  6    Period  Weeks    Status  New    Target Date  04/21/19      PT LONG TERM GOAL #4   Title  improve FOTO score to </=38% limited to demo improvement in function    Time  6    Period  Weeks    Status  New    Target Date  04/21/19      PT LONG TERM GOAL #5   Title  pt to be I with all HEP given as of last visit to maintain and progress current level of function    Time  6    Period  Weeks    Status  New    Target Date  04/21/19            Plan - 04/01/19 0818    Clinical Impression Statement  Mr Pate continues to make good progress with physical therapy with no back pain today. He is demonstrating good progress with FOTO reducing limitation to 33%. focused session on hip/ core  strength with emphasis on endurance utilzing increased reps/ sets and reduce rest which he did well with. plan to reasses progress in the next 2 visits and determine if more PT is needed.    PT Treatment/Interventions  Manual techniques;Dry needling;Passive range of motion;Patient/family education;Therapeutic activities;Therapeutic exercise;Gait training;Moist Heat;Iontophoresis '4mg'$ /ml Dexamethasone;ADLs/Self Care Home Management;Cryotherapy;Electrical Stimulation;Traction;Ultrasound;Stair training;Balance training;Taping    PT Next Visit Plan  review/ update HEP PRN, hamstring stretching, endurance training, core strengthening, gait training without AD. stair training.    PT Home Exercise Plan  [id number] - LTR, supine/ seated hamstring stretch, hip flexor MET, standing marching with bil HHA, log rolling, sit to stand with hands on knees.    Consulted and Agree with Plan of Care  Patient       Patient will benefit from skilled therapeutic intervention in order to improve the following deficits and impairments:  Improper body mechanics, Increased muscle spasms, Decreased strength, Abnormal gait, Pain, Obesity, Postural dysfunction, Decreased balance, Decreased endurance, Decreased activity tolerance  Visit Diagnosis: No diagnosis found.     Problem List Patient Active Problem List   Diagnosis Date Noted  . Hyperlipidemia 07/10/2018  . Hypertension 06/22/2016  . 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 Post-traumatic osteoarthritis of right hip 09/14/2015  . Right hip pain 09/14/2015  . Type 2 diabetes mellitus ([place]) 07/25/2012  . Morbid obesity ([place]) 07/25/2012   Starr Lake PT, DPT, LAT, ATC  04/01/19  8:46 AM      [place]  [place], [place], [address] Phone: [phone]   Fax:  [phone]  Name: Brandon Finley MRN: [id number] Date of Birth: 11/09/1960

## 2019-04-06 ENCOUNTER — Other Ambulatory Visit: Payer: Self-pay

## 2019-04-06 ENCOUNTER — Encounter: Payer: Self-pay | Admitting: Physical Therapy

## 2019-04-06 ENCOUNTER — Ambulatory Visit: Payer: No Typology Code available for payment source | Admitting: Physical Therapy

## 2019-04-06 DIAGNOSIS — M25651 Stiffness of right hip, not elsewhere classified: Secondary | ICD-10-CM | POA: Diagnosis not present

## 2019-04-06 DIAGNOSIS — M62830 Muscle spasm of back: Secondary | ICD-10-CM | POA: Diagnosis not present

## 2019-04-06 DIAGNOSIS — R2689 Other abnormalities of gait and mobility: Secondary | ICD-10-CM | POA: Diagnosis not present

## 2019-04-06 DIAGNOSIS — R293 Abnormal posture: Secondary | ICD-10-CM

## 2019-04-06 DIAGNOSIS — G8929 Other chronic pain: Secondary | ICD-10-CM | POA: Diagnosis not present

## 2019-04-06 DIAGNOSIS — M25652 Stiffness of left hip, not elsewhere classified: Secondary | ICD-10-CM | POA: Diagnosis not present

## 2019-04-06 DIAGNOSIS — M545 Low back pain: Secondary | ICD-10-CM | POA: Diagnosis not present

## 2019-04-06 NOTE — Therapy (Signed)
[place], [place], [address] Phone: [phone]   Fax:  [phone]  Physical Therapy Treatment  Patient Details  Name: Brandon Finley MRN: [id number] Date of Birth: [birth year] Referring Provider (PT): Charlott Rakes, MD    Encounter Date: 04/06/2019  PT End of Session - 04/06/19 0755    Visit Number  7    Number of Visits  12    Date for PT Re-Evaluation  04/21/19    Authorization Type  Med pay with MCR secondary - KX mod at 15th visit,    PT Start Time  0750    PT Stop Time  0832    PT Time Calculation (min)  42 min       Past Medical History:  Diagnosis Date  . Arthritis   . Hypertension     Past Surgical History:  Procedure Laterality Date  . TOTAL HIP ARTHROPLASTY Right 03/13/2016   Procedure: RIGHT TOTAL HIP ARTHROPLASTY ANTERIOR APPROACH;  Surgeon: Mcarthur Rossetti, MD;  Location: [place];  Service: Orthopedics;  Laterality: Right;    There were no vitals filed for this visit.                    [facility] Adult PT Treatment/Exercise - 04/06/19 0001      Lumbar Exercises: Aerobic   Nustep  L5 x 7 min LE only   increased time for endurance     Knee/Hip Exercises: Stretches   Gastroc Stretch  2 reps;30 seconds;Left   slant board     Knee/Hip Exercises: Standing   Hip Flexion  2 sets    Hip Abduction  2 sets;Knee straight;Both;Stengthening;15 reps    Hip Extension  Stengthening;2 sets    Functional Squat  10 reps    Functional Squat Limitations  at sink     Stairs  8 inch step ups x 10 each     SLS  30 sec each with intermittent UE touch     Other Standing Knee Exercises  dead lift 2 x 10 10# kettlebell from 8 inch step      Knee/Hip Exercises: Seated   Long Arc Quad  2 sets;20 reps;Left;Right    Long Arc Quad Weight  4 lbs.    Marching  Strengthening;2 sets;20 reps;Weights    Marching Weights  4 lbs.    Sit to Sand  1 set;20 reps   cues for controlled  lowering              PT Short Term Goals - 04/06/19 0757      PT SHORT TERM GOAL #1   Title  pt to be I with inital HEP    Time  3    Period  Weeks    Status  Achieved      PT SHORT TERM GOAL #2   Title  He will demo understanding of good sitting / standing posture to reduce and prevent back/ hip pain    Baseline  being more mindful    Time  3    Period  Weeks    Status  Achieved        PT Long Term Goals - 03/10/19 0946      PT LONG TERM GOAL #1   Title  pt to be able to maintain functional trunk mobility with </= 2/10 pain for functional mobility required for ADLs    Time  6    Period  Weeks  Status  New    Target Date  04/21/19      PT LONG TERM GOAL #2   Title  pt to be able to stand and walk for >/= 30 min with </= 2/10 pain with LRAD for in home and community ambulation    Time  6    Period  Weeks    Status  New    Target Date  04/21/19      PT LONG TERM GOAL #3   Title  pt to return to walking regimen per pt's perosnal goals to get back to walking for exercise    Time  6    Period  Weeks    Status  New    Target Date  04/21/19      PT LONG TERM GOAL #4   Title  improve FOTO score to </=38% limited to demo improvement in function    Time  6    Period  Weeks    Status  New    Target Date  04/21/19      PT LONG TERM GOAL #5   Title  pt to be I with all HEP given as of last visit to maintain and progress current level of function    Time  6    Period  Weeks    Status  New    Target Date  04/21/19            Plan - 04/06/19 0754    Clinical Impression Statement  Pt arrives without pain. He [place] the mall to walk. He was able to complete 25 minutes prior to rest due to left hip tightness and 6/10 pain. Continued with hip and LE strengthening. No c/o pain during session. He is independent with his HEP and is mindful of posture. STG#1, #2 met.    PT Next Visit Plan  review/ update HEP PRN, hamstring stretching, endurance  training, core strengthening, gait training without AD. stair training.    PT Home Exercise Plan  [id number] - LTR, supine/ seated hamstring stretch, hip flexor MET, standing marching with bil HHA, log rolling, sit to stand with hands on knees.       Patient will benefit from skilled therapeutic intervention in order to improve the following deficits and impairments:  Improper body mechanics, Increased muscle spasms, Decreased strength, Abnormal gait, Pain, Obesity, Postural dysfunction, Decreased balance, Decreased endurance, Decreased activity tolerance  Visit Diagnosis: Chronic right-sided low back pain without sciatica  Abnormal posture  Muscle spasm of back  Other abnormalities of gait and mobility  Stiffness of right hip joint  Stiffness of left hip, not elsewhere classified     Problem List Patient Active Problem List   Diagnosis Date Noted  . Hyperlipidemia 07/10/2018  . Hypertension 06/22/2016  . 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 Post-traumatic osteoarthritis of right hip 09/14/2015  . Right hip pain 09/14/2015  . Type 2 diabetes mellitus ([place]) 07/25/2012  . Morbid obesity ([place]) 07/25/2012    Dorene Ar, PTA 04/06/2019, 8:50 AM  [facility] [address] [place], [place], [address] Phone: [phone]   Fax:  [phone]  Name: Brandon Finley MRN: [id number] Date of Birth: 1960-03-01

## 2019-04-07 ENCOUNTER — Ambulatory Visit: Payer: Medicare Other | Admitting: Orthopaedic Surgery

## 2019-04-07 DIAGNOSIS — H25813 Combined forms of age-related cataract, bilateral: Secondary | ICD-10-CM | POA: Diagnosis not present

## 2019-04-07 DIAGNOSIS — H524 Presbyopia: Secondary | ICD-10-CM | POA: Diagnosis not present

## 2019-04-07 DIAGNOSIS — H52223 Regular astigmatism, bilateral: Secondary | ICD-10-CM | POA: Diagnosis not present

## 2019-04-08 ENCOUNTER — Ambulatory Visit: Payer: No Typology Code available for payment source | Admitting: Physical Therapy

## 2019-04-08 ENCOUNTER — Other Ambulatory Visit: Payer: Self-pay

## 2019-04-08 ENCOUNTER — Encounter: Payer: Self-pay | Admitting: Physical Therapy

## 2019-04-08 DIAGNOSIS — R293 Abnormal posture: Secondary | ICD-10-CM | POA: Diagnosis not present

## 2019-04-08 DIAGNOSIS — M25652 Stiffness of left hip, not elsewhere classified: Secondary | ICD-10-CM | POA: Diagnosis not present

## 2019-04-08 DIAGNOSIS — R2689 Other abnormalities of gait and mobility: Secondary | ICD-10-CM | POA: Diagnosis not present

## 2019-04-08 DIAGNOSIS — M62830 Muscle spasm of back: Secondary | ICD-10-CM

## 2019-04-08 DIAGNOSIS — G8929 Other chronic pain: Secondary | ICD-10-CM

## 2019-04-08 DIAGNOSIS — M545 Low back pain: Secondary | ICD-10-CM | POA: Diagnosis not present

## 2019-04-08 DIAGNOSIS — M25651 Stiffness of right hip, not elsewhere classified: Secondary | ICD-10-CM | POA: Diagnosis not present

## 2019-04-08 NOTE — Therapy (Signed)
[place], [place], [address] Phone: [phone]   Fax:  [phone]  Physical Therapy Treatment / Discharge  Patient Details  Name: Brandon Finley MRN: [id number] Date of Birth: 1961/01/05 Referring Provider (PT): Brandon Rakes, MD    Encounter Date: 04/08/2019  PT End of Session - 04/08/19 0801    Visit Number  8    Number of Visits  12    Date for PT Re-Evaluation  04/21/19    Authorization Type  Med pay with MCR secondary - KX mod at 15th visit,    Progress Note Due on Visit  10    PT Start Time  0801    PT Stop Time  0832    PT Time Calculation (min)  31 min    Activity Tolerance  Patient tolerated treatment well    Behavior During Therapy  Brandon Finley for tasks assessed/performed       Past Medical History:  Diagnosis Date  . Arthritis   . Hypertension     Past Surgical History:  Procedure Laterality Date  . TOTAL HIP ARTHROPLASTY Right 03/13/2016   Procedure: RIGHT TOTAL HIP ARTHROPLASTY ANTERIOR APPROACH;  Surgeon: Brandon Rossetti, MD;  Location: [place];  Service: Orthopedics;  Laterality: Right;    There were no vitals filed for this visit.  Subjective Assessment - 04/08/19 0802    Subjective  "I am feeling alittle tight and sore this morning"    Currently in Pain?  No/denies    Pain Score  0-No pain    Aggravating Factors   N/A         [facility] PT Assessment - 04/08/19 0806      Assessment   Medical Diagnosis  Musculoskeletal back pain     Referring Provider (PT)  Brandon Rakes, MD       Observation/Other Assessments   Focus on Therapeutic Outcomes (FOTO)   18%      AROM   Lumbar Flexion  100    Lumbar Extension  15    Lumbar - Right Side Bend  18    Lumbar - Left Side Bend  18      6 minute walk test results    Aerobic Endurance Distance Walked  1110    Endurance additional comments  with no rest breaks, but did continue use of SPC to compare to initial test                             PT Education - 04/08/19 0840    Education Details  reviewed previously provided HEP and updated today. reviewed appropriate progression of strengthening / endurance training with increased reps/ sets and resistance.    Person(s) Educated  Patient    Methods  Explanation;Verbal cues;Handout    Comprehension  Verbalized understanding;Verbal cues required       PT Short Term Goals - 04/06/19 0757      PT SHORT TERM GOAL #1   Title  pt to be I with inital HEP    Time  3    Period  Weeks    Status  Achieved      PT SHORT TERM GOAL #2   Title  He will demo understanding of good sitting / standing posture to reduce and prevent back/ hip pain    Baseline  being more mindful    Time  3    Period  Weeks  Status  Achieved        PT Long Term Goals - 04/08/19 0037      PT LONG TERM GOAL #1   Title  pt to be able to maintain functional trunk mobility with </= 2/10 pain for functional mobility required for ADLs    Period  Weeks    Status  Achieved      PT LONG TERM GOAL #2   Title  pt to be able to stand and walk for >/= 30 min with </= 2/10 pain with LRAD for in home and community ambulation    Period  Weeks    Status  Achieved      PT LONG TERM GOAL #3   Title  pt to return to walking regimen per pt's perosnal goals to get back to walking for exercise    Period  Weeks    Status  Achieved      PT LONG TERM GOAL #4   Title  improve FOTO score to </=38% limited to demo improvement in function    Period  Weeks    Status  Achieved      PT LONG TERM GOAL #5   Title  pt to be I with all HEP given as of last visit to maintain and progress current level of function    Baseline  -    Period  Weeks    Status  On-going            Plan - 04/08/19 0801    Clinical Impression Statement  Brandon Finley has made great progress with physical therapy increasing trunk mobility, and addiitionally reports no pain. he increased his 6 min walk  distance from 854 to 1110 today and required no rest breaks. He met all goals today and is able to maintain and progress his current level of function and will be discharged from PT today.    Comorbidities  hx or arthritis, hx of THA on R    PT Treatment/Interventions  Manual techniques;Dry needling;Passive range of motion;Patient/family education;Therapeutic activities;Therapeutic exercise;Gait training;Moist Heat;Iontophoresis 32m/ml Dexamethasone;ADLs/Self Care Home Management;Cryotherapy;Electrical Stimulation;Traction;Ultrasound;Stair training;Balance training;Taping    PT Next Visit Plan  D/C    PT Home Exercise Plan  [id number] - LTR, supine/ seated hamstring stretch, hip flexor MET, standing marching with bil HHA, log rolling, sit to stand with hands on knees.       Patient will benefit from skilled therapeutic intervention in order to improve the following deficits and impairments:  Improper body mechanics, Increased muscle spasms, Decreased strength, Abnormal gait, Pain, Obesity, Postural dysfunction, Decreased balance, Decreased endurance, Decreased activity tolerance  Visit Diagnosis: Chronic right-sided low back pain without sciatica  Abnormal posture  Muscle spasm of back     Problem List Patient Active Problem List   Diagnosis Date Noted  . Hyperlipidemia 07/10/2018  . Hypertension 06/22/2016  . 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 Post-traumatic osteoarthritis of right hip 09/14/2015  . Right hip pain 09/14/2015  . [place] 2 diabetes mellitus ([place] 07/25/2012  . [place] 07/25/2012    LStarr Lake3/24/2021, 8:46 AM  [facility] [address]Phone: [phone]  Fax:  [phone] Name: Brandon MODESTOMRN: [id number] of Birth: [birth year]       PHYSICAL THERAPY DISCHARGE SUMMARY  Visits from Start of Care: 8   Current functional  level related to goals / functional outcomes: See goals, FOTO  18% limited   Remaining deficits: Limited walking endurance secondary to fatigue   Education / Equipment: HEP, theraband, posture, gait biomechanics.   Plan: Patient agrees to discharge.  Patient goals were met. Patient is being discharged due to meeting the stated rehab goals.  ?????          Brandon Finley PT, DPT, LAT, ATC  04/08/19  8:47 AM

## 2019-04-28 ENCOUNTER — Ambulatory Visit: Payer: Medicare Other | Attending: Family Medicine | Admitting: Family Medicine

## 2019-04-28 ENCOUNTER — Other Ambulatory Visit: Payer: Self-pay

## 2019-04-28 VITALS — BP 170/101 | HR 75 | Temp 98.0°F | Ht 73.0 in | Wt 320.0 lb

## 2019-04-28 DIAGNOSIS — K449 Diaphragmatic hernia without obstruction or gangrene: Secondary | ICD-10-CM | POA: Diagnosis not present

## 2019-04-28 DIAGNOSIS — B351 Tinea unguium: Secondary | ICD-10-CM

## 2019-04-28 DIAGNOSIS — I10 Essential (primary) hypertension: Secondary | ICD-10-CM

## 2019-04-28 DIAGNOSIS — Z6841 Body Mass Index (BMI) 40.0 and over, adult: Secondary | ICD-10-CM

## 2019-04-28 DIAGNOSIS — E1165 Type 2 diabetes mellitus with hyperglycemia: Secondary | ICD-10-CM | POA: Diagnosis not present

## 2019-04-28 DIAGNOSIS — M545 Low back pain, unspecified: Secondary | ICD-10-CM

## 2019-04-28 DIAGNOSIS — G8929 Other chronic pain: Secondary | ICD-10-CM

## 2019-04-28 LAB — POCT GLYCOSYLATED HEMOGLOBIN (HGB A1C): Hemoglobin A1C: 10.5 % — AB (ref 4.0–5.6)

## 2019-04-28 MED ORDER — LISINOPRIL-HYDROCHLOROTHIAZIDE 20-12.5 MG PO TABS: 2.0000 | ORAL_TABLET | Freq: Every day | ORAL | 1 refills | Status: DC

## 2019-04-28 MED ORDER — VICTOZA 18 MG/3ML ~~LOC~~ SOPN: 1.8000 mg | PEN_INJECTOR | Freq: Every day | SUBCUTANEOUS | 6 refills | Status: DC

## 2019-04-28 MED FILL — LISINOPRIL-HCTZ 20-12.5 MG: 20-12.5 | 90 days supply | Qty: 180 | Fill #0

## 2019-04-28 MED FILL — VICTOZA 18 MG/3 ML INJECT P: 18 | 30 days supply | Qty: 9 | Fill #0
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
## 2019-04-28 NOTE — Patient Instructions (Signed)

## 2019-04-29 ENCOUNTER — Other Ambulatory Visit: Payer: Self-pay | Admitting: Pharmacist

## 2019-04-29 ENCOUNTER — Ambulatory Visit: Payer: Medicare Other | Attending: Family Medicine

## 2019-04-29 DIAGNOSIS — E1165 Type 2 diabetes mellitus with hyperglycemia: Secondary | ICD-10-CM | POA: Diagnosis not present

## 2019-04-29 MED ORDER — TRUEPLUS PEN NEEDLES 32G X 4 MM MISC: 6 refills | Status: DC

## 2019-04-29 MED FILL — TRUEPLUS PEN NDL 32GX5/32: 32G X 4 MM | 25 days supply | Qty: 100 | Fill #0

## 2019-04-30 LAB — CMP14+EGFR
ALT: 55 IU/L — ABNORMAL HIGH (ref 0–44)
AST: 29 IU/L (ref 0–40)
Albumin/Globulin Ratio: 1.3 (ref 1.2–2.2)
Albumin: 4.5 g/dL (ref 3.8–4.9)
Alkaline Phosphatase: 117 IU/L (ref 39–117)
BUN/Creatinine Ratio: 9 (ref 9–20)
BUN: 6 mg/dL (ref 6–24)
Bilirubin Total: 0.5 mg/dL (ref 0.0–1.2)
CO2: 23 mmol/L (ref 20–29)
Calcium: 10.1 mg/dL (ref 8.7–10.2)
Chloride: 97 mmol/L (ref 96–106)
Creatinine, Ser: 0.68 mg/dL — ABNORMAL LOW (ref 0.76–1.27)
GFR calc Af Amer: 122 mL/min/{1.73_m2} (ref >59)
GFR calc non Af Amer: 105 mL/min/{1.73_m2} (ref >59)
Globulin, Total: 3.6 g/dL (ref 1.5–4.5)
Glucose: 348 mg/dL — ABNORMAL HIGH (ref 65–99)
Potassium: 4.3 mmol/L (ref 3.5–5.2)
Sodium: 135 mmol/L (ref 134–144)
Total Protein: 8.1 g/dL (ref 6.0–8.5)

## 2019-04-30 LAB — LIPID PANEL
Chol/HDL Ratio: 7 ratio — ABNORMAL HIGH (ref 0.0–5.0)
Cholesterol, Total: 204 mg/dL — ABNORMAL HIGH (ref 100–199)
HDL: 29 mg/dL — ABNORMAL LOW (ref >39)
LDL Chol Calc (NIH): 125 mg/dL — ABNORMAL HIGH (ref 0–99)
Triglycerides: 280 mg/dL — ABNORMAL HIGH (ref 0–149)
VLDL Cholesterol Cal: 50 mg/dL — ABNORMAL HIGH (ref 5–40)

## 2019-04-30 LAB — MICROALBUMIN / CREATININE URINE RATIO
Creatinine, Urine: 71.2 mg/dL
Microalb/Creat Ratio: 108 mg/g creat — ABNORMAL HIGH (ref 0–29)
Microalbumin, Urine: 76.6 ug/mL

## 2019-05-01 ENCOUNTER — Telehealth: Payer: Self-pay

## 2019-05-01 ENCOUNTER — Other Ambulatory Visit: Payer: Self-pay | Admitting: Family Medicine

## 2019-05-01 DIAGNOSIS — E782 Mixed hyperlipidemia: Secondary | ICD-10-CM

## 2019-05-01 MED ORDER — ATORVASTATIN CALCIUM 80 MG PO TABS: 80.0000 mg | ORAL_TABLET | Freq: Every day | ORAL | 1 refills | Status: DC

## 2019-05-01 MED FILL — ATORVASTATIN 80 MG TABLET: 80 | 90 days supply | Qty: 90 | Fill #0

## 2019-05-01 NOTE — Telephone Encounter (Signed)
-----   Message from Hoy Register, MD sent at 05/01/2019  9:04 AM EDT ----- Cholesterol is elevated and I have increased his Lipitor from 40 mg to 80 mg.  Please encourage compliance with a low-cholesterol diet.

## 2019-05-01 NOTE — Telephone Encounter (Signed)
Patient name and DOB has been verified Patient was informed of lab results. Patient had no questions.  

## 2019-05-12 MED FILL — ATORVASTATIN 80 MG TABLET: 80 | 90 days supply | Qty: 90 | Fill #0

## 2019-05-25 ENCOUNTER — Other Ambulatory Visit: Payer: Self-pay

## 2019-05-25 ENCOUNTER — Ambulatory Visit: Payer: Medicare Other | Attending: Family Medicine | Admitting: Family Medicine

## 2019-05-25 ENCOUNTER — Encounter: Payer: Self-pay | Admitting: Family Medicine

## 2019-05-25 VITALS — BP 145/82 | HR 81 | Ht 73.0 in | Wt 319.0 lb

## 2019-05-25 DIAGNOSIS — M545 Low back pain: Secondary | ICD-10-CM | POA: Diagnosis not present

## 2019-05-25 DIAGNOSIS — I10 Essential (primary) hypertension: Secondary | ICD-10-CM | POA: Diagnosis not present

## 2019-05-25 DIAGNOSIS — Z87891 Personal history of nicotine dependence: Secondary | ICD-10-CM | POA: Insufficient documentation

## 2019-05-25 DIAGNOSIS — E785 Hyperlipidemia, unspecified: Secondary | ICD-10-CM | POA: Diagnosis not present

## 2019-05-25 DIAGNOSIS — E119 Type 2 diabetes mellitus without complications: Secondary | ICD-10-CM | POA: Insufficient documentation

## 2019-05-25 DIAGNOSIS — M1990 Unspecified osteoarthritis, unspecified site: Secondary | ICD-10-CM | POA: Insufficient documentation

## 2019-05-25 DIAGNOSIS — Z79899 Other long term (current) drug therapy: Secondary | ICD-10-CM | POA: Insufficient documentation

## 2019-05-25 DIAGNOSIS — Z7901 Long term (current) use of anticoagulants: Secondary | ICD-10-CM | POA: Insufficient documentation

## 2019-05-25 DIAGNOSIS — Z794 Long term (current) use of insulin: Secondary | ICD-10-CM | POA: Insufficient documentation

## 2019-05-25 DIAGNOSIS — E1165 Type 2 diabetes mellitus with hyperglycemia: Secondary | ICD-10-CM

## 2019-05-25 DIAGNOSIS — R0789 Other chest pain: Secondary | ICD-10-CM

## 2019-05-25 DIAGNOSIS — Z7982 Long term (current) use of aspirin: Secondary | ICD-10-CM | POA: Diagnosis not present

## 2019-05-25 DIAGNOSIS — Z6841 Body Mass Index (BMI) 40.0 and over, adult: Secondary | ICD-10-CM | POA: Insufficient documentation

## 2019-05-25 LAB — GLUCOSE, POCT (MANUAL RESULT ENTRY): POC Glucose: 288 mg/dl — AB (ref 70–99)
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
## 2019-05-25 NOTE — Patient Instructions (Signed)

## 2019-06-24 ENCOUNTER — Encounter: Payer: Self-pay | Admitting: Podiatry

## 2019-06-24 ENCOUNTER — Ambulatory Visit (INDEPENDENT_AMBULATORY_CARE_PROVIDER_SITE_OTHER): Payer: Medicare Other | Admitting: Podiatry

## 2019-06-24 ENCOUNTER — Other Ambulatory Visit: Payer: Self-pay

## 2019-06-24 DIAGNOSIS — B351 Tinea unguium: Secondary | ICD-10-CM

## 2019-06-24 DIAGNOSIS — E119 Type 2 diabetes mellitus without complications: Secondary | ICD-10-CM

## 2019-06-24 DIAGNOSIS — M79675 Pain in left toe(s): Secondary | ICD-10-CM | POA: Diagnosis not present

## 2019-06-24 DIAGNOSIS — M79674 Pain in right toe(s): Secondary | ICD-10-CM | POA: Diagnosis not present

## 2019-06-24 NOTE — Progress Notes (Signed)
This patient returns to my office for at risk foot care.  This patient requires this care by a professional since this patient will be at risk due to having  Diabetes.   This patient is unable to cut nails himself since the patient cannot reach his nails.These nails are painful walking and wearing shoes.  This patient presents for at risk foot care today. Patient has not been treated since 7/20.  General Appearance  Alert, conversant and in no acute stress.  Vascular  Dorsalis pedis and posterior tibial  pulses are palpable  bilaterally.  Capillary return is within normal limits  bilaterally. Temperature is within normal limits  bilaterally.  Neurologic  Senn-Weinstein monofilament wire test within normal limits  bilaterally. Muscle power within normal limits bilaterally.  Nails Thick disfigured discolored nails with subungual debris  from hallux to fifth toes bilaterally. No evidence of bacterial infection or drainage bilaterally.  Orthopedic  No limitations of motion  feet .  No crepitus or effusions noted.  No bony pathology or digital deformities noted.  Skin  normotropic skin with no porokeratosis noted bilaterally.  No signs of infections or ulcers noted.     Onychomycosis  Pain in right toes  Pain in left toes  Consent was obtained for treatment procedures.   Mechanical debridement of nails 1-5  bilaterally performed with a nail nipper.  Filed with dremel without incident.    Return office visit  3 months                    Told patient to return for periodic foot care and evaluation due to potential at risk complications.   Helane Gunther DPM

## 2019-07-06 ENCOUNTER — Telehealth: Payer: Self-pay | Admitting: Family Medicine

## 2019-07-06 DIAGNOSIS — B3742 Candidal balanitis: Secondary | ICD-10-CM

## 2019-07-06 MED ORDER — CLOTRIMAZOLE 1 % EX CREA: 1.0000 "application " | TOPICAL_CREAM | Freq: Two times a day (BID) | CUTANEOUS | 0 refills | Status: DC

## 2019-07-06 NOTE — Telephone Encounter (Signed)
1) Medication(s) Requested (by name):  clotrimazole (LOTRIMIN) 1 % cream    2) Pharmacy of Choice:  [facility]   3) Special Requests:  Please, let him know when is ready to pick up  Thanks     Approved medications will be sent to the pharmacy, we will reach out if there is an issue.  Requests made after 3pm may not be addressed until the following business day!  If a patient is unsure of the name of the medication(s) please note and ask patient to call back when they are able to provide all info, do not send to responsible party until all information is available!

## 2019-07-06 NOTE — Telephone Encounter (Signed)
Rx sent 

## 2019-08-15 ENCOUNTER — Other Ambulatory Visit: Payer: Self-pay | Admitting: Family Medicine

## 2019-08-20 ENCOUNTER — Ambulatory Visit: Payer: Medicare Other | Admitting: Family Medicine

## 2019-09-01 ENCOUNTER — Other Ambulatory Visit: Payer: Self-pay | Admitting: Family Medicine

## 2019-09-01 DIAGNOSIS — B3742 Candidal balanitis: Secondary | ICD-10-CM

## 2019-09-01 NOTE — Telephone Encounter (Signed)
Requested medication (s) are due for refill today - no  Requested medication (s) are on the active medication list -yes  Future visit scheduled -yes  Last refill: 07/06/19  Notes to clinic: Patient request RF of medication not assigned to protocol. Per note- patient still having symptoms- requesting RF- Appt 9/7  Requested Prescriptions  Pending Prescriptions Disp Refills   clotrimazole (LOTRIMIN) 1 % cream 60 g 0    Sig: Apply 1 application topically 2 (two) times daily.      Off-Protocol Failed - 09/01/2019 12:58 PM      Failed - Medication not assigned to a protocol, review manually.      Passed - Valid encounter within last 12 months    Recent Outpatient Visits           3 months ago Type 2 diabetes mellitus with hyperglycemia, without long-term current use of insulin (HCC)   [facility] [facility] [place], Odette Horns, MD   4 months ago Onychomycosis   [place] [facility] Hoy Register, MD   6 months ago Musculoskeletal back pain   [place] [facility] [place], Odette Horns, MD   6 months ago Type 2 diabetes mellitus with hyperglycemia, without long-term current use of insulin (HCC)   [facility] [place], Odette Horns, MD   12 months ago Type 2 diabetes mellitus with other specified complication, unspecified whether long term insulin use [facility])   [place] [facility] [place], Cornelius Moras, RPH-CPP       Future Appointments             In 3 weeks Hoy Register, MD [facility]                Requested Prescriptions  Pending Prescriptions Disp Refills   clotrimazole (LOTRIMIN) 1 % cream 60 g 0    Sig: Apply 1 application topically 2 (two) times daily.      Off-Protocol Failed - 09/01/2019 12:58 PM      Failed - Medication not assigned to a protocol, review manually.      Passed - Valid encounter within last 12 months    Recent  Outpatient Visits           3 months ago Type 2 diabetes mellitus with hyperglycemia, without long-term current use of insulin (HCC)   [place] [facility] [place], Odette Horns, MD   4 months ago Onychomycosis   [place] [facility] Hoy Register, MD   6 months ago Musculoskeletal back pain   [place] [facility] [place], Odette Horns, MD   6 months ago Type 2 diabetes mellitus with hyperglycemia, without long-term current use of insulin [facility])   [place] [facility] [place], Odette Horns, MD   12 months ago Type 2 diabetes mellitus with other specified complication, unspecified whether long term insulin use [facility])   [place] [facility] Lois Huxley, Cornelius Moras, RPH-CPP       Future Appointments             In 3 weeks Hoy Register, MD [facility]

## 2019-09-01 NOTE — Telephone Encounter (Signed)
Pt last seen dr Alvis Lemmings in may 2021. Pt is still having itching around penis and would like another refill on clotrimazole. [facility]

## 2019-09-03 MED ORDER — CLOTRIMAZOLE 1 % EX CREA: 1.0000 "application " | TOPICAL_CREAM | Freq: Two times a day (BID) | CUTANEOUS | 0 refills | Status: DC

## 2019-09-09 ENCOUNTER — Telehealth: Payer: Self-pay | Admitting: Orthopaedic Surgery

## 2019-09-09 NOTE — Telephone Encounter (Signed)
Received call from pt wanting to know date of his last hip surgery with Dr. Magnus Ivan. I advised per notes

## 2019-09-18 ENCOUNTER — Telehealth: Payer: Self-pay | Admitting: Family Medicine

## 2019-09-18 DIAGNOSIS — R29818 Other symptoms and signs involving the nervous system: Secondary | ICD-10-CM

## 2019-09-18 NOTE — Telephone Encounter (Signed)
Patient called to ask for a referral for a sleep apnea test.  He would like this referral as soon as possible.  Please call patient to confirm at [phone]

## 2019-09-18 NOTE — Telephone Encounter (Signed)
Patient is requesting a sleep apnea test.

## 2019-09-22 ENCOUNTER — Encounter: Payer: Self-pay | Admitting: Family Medicine

## 2019-09-22 ENCOUNTER — Other Ambulatory Visit: Payer: Self-pay

## 2019-09-22 ENCOUNTER — Ambulatory Visit: Payer: Medicare Other | Attending: Family Medicine | Admitting: Family Medicine

## 2019-09-22 VITALS — BP 137/84 | HR 82 | Ht 73.0 in | Wt 316.0 lb

## 2019-09-22 DIAGNOSIS — I10 Essential (primary) hypertension: Secondary | ICD-10-CM | POA: Diagnosis not present

## 2019-09-22 DIAGNOSIS — B3742 Candidal balanitis: Secondary | ICD-10-CM

## 2019-09-22 DIAGNOSIS — E1165 Type 2 diabetes mellitus with hyperglycemia: Secondary | ICD-10-CM | POA: Diagnosis not present

## 2019-09-22 DIAGNOSIS — E782 Mixed hyperlipidemia: Secondary | ICD-10-CM

## 2019-09-22 DIAGNOSIS — Z23 Encounter for immunization: Secondary | ICD-10-CM | POA: Diagnosis not present

## 2019-09-22 DIAGNOSIS — R29818 Other symptoms and signs involving the nervous system: Secondary | ICD-10-CM

## 2019-09-22 LAB — POCT GLYCOSYLATED HEMOGLOBIN (HGB A1C): HbA1c, POC (controlled diabetic range): 10.3 % — AB (ref 0.0–7.0)

## 2019-09-22 LAB — GLUCOSE, POCT (MANUAL RESULT ENTRY): POC Glucose: 374 mg/dl — AB (ref 70–99)

## 2019-09-22 MED ORDER — LANTUS SOLOSTAR 100 UNIT/ML ~~LOC~~ SOPN: 10.0000 [IU] | PEN_INJECTOR | Freq: Every day | SUBCUTANEOUS | 6 refills | Status: DC

## 2019-09-22 MED ORDER — GLIPIZIDE 10 MG PO TABS: 10.0000 mg | ORAL_TABLET | Freq: Two times a day (BID) | ORAL | 1 refills | Status: DC

## 2019-09-22 MED ORDER — CETIRIZINE HCL 10 MG PO TABS: 10.0000 mg | ORAL_TABLET | Freq: Every day | ORAL | 1 refills | Status: DC

## 2019-09-22 MED ORDER — VICTOZA 18 MG/3ML ~~LOC~~ SOPN: 1.8000 mg | PEN_INJECTOR | Freq: Every day | SUBCUTANEOUS | 6 refills | Status: DC

## 2019-09-22 MED ORDER — LISINOPRIL-HYDROCHLOROTHIAZIDE 10-12.5 MG PO TABS: 1.0000 | ORAL_TABLET | Freq: Every day | ORAL | 1 refills | Status: DC

## 2019-09-22 MED ORDER — CLOTRIMAZOLE 1 % EX CREA: 1.0000 "application " | TOPICAL_CREAM | Freq: Two times a day (BID) | CUTANEOUS | 1 refills | Status: DC

## 2019-09-22 MED ORDER — TRUEPLUS PEN NEEDLES 32G X 4 MM MISC: 6 refills | Status: AC

## 2019-09-22 MED ORDER — ATORVASTATIN CALCIUM 80 MG PO TABS: 80.0000 mg | ORAL_TABLET | Freq: Every day | ORAL | 1 refills | Status: DC

## 2019-09-22 MED FILL — VICTOZA 18 MG/3 ML INJECT P: 18 | 30 days supply | Qty: 9 | Fill #0

## 2019-09-22 MED FILL — ATORVASTATIN CALCIUM 80 MG: 80 | 90 days supply | Qty: 90 | Fill #0

## 2019-09-22 MED FILL — glipiZIDE 10 MG TABS: 10 | 90 days supply | Qty: 180 | Fill #0

## 2019-09-22 MED FILL — TRUEplus 5-BEVEL PEN NEEDLE: 32G X 4 MM | 50 days supply | Qty: 100 | Fill #0

## 2019-09-22 MED FILL — LISINOPRIL-HCTZ 10-12.5 MG: 10-12.5 | 90 days supply | Qty: 90 | Fill #0

## 2019-09-22 MED FILL — LANTUS SOLOSTAR 100 UNITS/M: 100 | 30 days supply | Qty: 3 | Fill #0

## 2019-09-22 NOTE — Progress Notes (Signed)
Wants to be checked for sleep apnea.  Getting flu vaccine today.

## 2019-09-22 NOTE — Patient Instructions (Signed)

## 2019-09-22 NOTE — Progress Notes (Signed)
Subjective:  Patient ID: Brandon Finley, male    DOB: 02/26/1960  Age: 59 y.o. 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  He has not been taking Metformin; states Metformin was too strong and he was advised to discontinue with this but has been compliant with glipizide and Victoza. [facility] and [facility]. He denies presence of blurry vision, numbness in extremities.  He complains of snoring at night, lack of her restful sleep, daytime somnolence and fatigue and would like to be checked for sleep apnea. He had called requesting this and a referral had been placed already. Compliant with his antihypertensive and statin. He has no additional concerns today.  Past Medical History:  Diagnosis Date  . Arthritis   . Hypertension     Past Surgical History:  Procedure Laterality Date  . TOTAL HIP ARTHROPLASTY Right 03/13/2016   Procedure: RIGHT TOTAL HIP ARTHROPLASTY ANTERIOR APPROACH;  Surgeon: Mcarthur Rossetti, MD;  Location: [facility];  Service: Orthopedics;  Laterality: Right;    Family History  Problem Relation Age of Onset  . Hypertension Father     Allergies  Allergen Reactions  . No Known Allergies     Outpatient Medications Prior to Visit  Medication Sig Dispense Refill  . aspirin EC 81 MG tablet Take 1 tablet (81 mg total) by mouth daily. 90 tablet 1  . Blood Glucose Monitoring Suppl (ONETOUCH VERIO) w/Device KIT Use to check blood sugar up to 3 times daily. 1 kit 0  . glucose blood (ONETOUCH VERIO) test strip Use as instructed 100 each 11  . lidocaine (LIDODERM) 5 % Place 1 patch onto the skin daily. Remove & Discard patch within 12 hours or as directed by MD 30 patch 0  . methocarbamol (ROBAXIN) 500 MG tablet Take 2  tablets (1,000 mg total) by mouth 2 (two) times daily. 120 tablet 2  . nabumetone (RELAFEN) 750 MG tablet Take 1 tablet by mouth twice daily as needed 60 tablet 11  . OneTouch Delica Lancets 08X MISC Use to check blood sugar up to 3 times daily. 100 each 11  . PEG 3350-KCl-Na Bicarb-NaCl (GAVILYTE-N WITH FLAVOR PACK PO)     . atorvastatin (LIPITOR) 80 MG tablet Take 1 tablet (80 mg total) by mouth daily. 90 tablet 1  . cetirizine (ZYRTEC) 10 MG tablet Take 1 tablet (10 mg total) by mouth daily. 30 tablet 1  . clotrimazole (LOTRIMIN) 1 % cream Apply 1 application topically 2 (two) times daily. 60 g 0  . glipiZIDE (GLUCOTROL) 10 MG tablet Take 1 tablet (10 mg total) by mouth 2 (two) times daily before a meal. 180 tablet 1  . Insulin Pen Needle (TRUEPLUS PEN NEEDLES) 32G X 4 MM MISC Use to inject insulin daily. 100 each 6  . liraglutide (VICTOZA) 18 MG/3ML SOPN Inject 0.3 mLs (1.8 mg total) into the skin daily with breakfast. 30 mL 6  . lisinopril-hydrochlorothiazide (ZESTORETIC) 10-12.5 MG tablet Take 1 tablet by mouth every day 90 tablet 0  . pravastatin (PRAVACHOL) 20 MG tablet Take 1 tablet by mouth every day 90 tablet 2  . metFORMIN (GLUCOPHAGE) 500 MG tablet Take 2 tablets by mouth twice daily with meals (Patient not taking: Reported on 09/22/2019) 360 tablet 0   No facility-administered  medications prior to visit.     ROS Review of Systems  Constitutional: Negative for activity change and appetite change.  HENT: Negative for sinus pressure and sore throat.   Eyes: Negative for visual disturbance.  Respiratory: Negative for cough, chest tightness and shortness of breath.   Cardiovascular: Negative for chest pain and leg swelling.  Gastrointestinal: Negative for abdominal distention, abdominal pain, constipation and diarrhea.  Endocrine: Negative.   Genitourinary: Negative for dysuria.  Musculoskeletal: Negative for joint swelling and myalgias.  Skin: Negative for rash.    Allergic/Immunologic: Negative.   Neurological: Negative for weakness, light-headedness and numbness.  Psychiatric/Behavioral: Negative for dysphoric mood and suicidal ideas.    Objective:  BP 137/84   Pulse 82   Ht '6\' 1"'  (1.854 m)   Wt (!) 316 lb (143.3 kg)   SpO2 96%   BMI 41.69 kg/m   BP/Weight 09/22/2019 05/25/2019 3/84/6659  Systolic BP 935 701 779  Diastolic BP 84 82 390  Wt. (Lbs) 316 319 320  BMI 41.69 42.09 42.22      Physical Exam Constitutional:      Appearance: He is well-developed. He is obese.  Neck:     Vascular: No JVD.  Cardiovascular:     Rate and Rhythm: Normal rate.     Heart sounds: Normal heart sounds. No murmur heard.   Pulmonary:     Effort: Pulmonary effort is normal.     Breath sounds: Normal breath sounds. No wheezing or rales.  Chest:     Chest wall: No tenderness.  Abdominal:     General: Bowel sounds are normal. There is no distension.     Palpations: Abdomen is soft. There is no mass.     Tenderness: There is no abdominal tenderness.  Musculoskeletal:        General: Normal range of motion.     Right lower leg: No edema.     Left lower leg: No edema.  Neurological:     Mental Status: He is alert and oriented to person, place, and time.  Psychiatric:        Mood and Affect: Mood normal.     CMP Latest Ref Rng & Units 04/29/2019 07/10/2018 06/05/2017  Glucose 65 - 99 mg/dL 348(H) 258(H) 84  BUN 6 - 24 mg/dL '6 12 7  ' Creatinine 0.76 - 1.27 mg/dL 0.68(L) 0.90 0.66(L)  Sodium 134 - 144 mmol/L 135 136 140  Potassium 3.5 - 5.2 mmol/L 4.3 4.4 4.3  Chloride 96 - 106 mmol/L 97 101 100  CO2 20 - 29 mmol/L '23 23 26  ' Calcium 8.7 - 10.2 mg/dL 10.1 9.7 9.4  Total Protein 6.0 - 8.5 g/dL 8.1 7.9 7.6  Total Bilirubin 0.0 - 1.2 mg/dL 0.5 0.5 0.4  Alkaline Phos 39 - 117 IU/L 117 88 86  AST 0 - 40 IU/L 29 41(H) 21  ALT 0 - 44 IU/L 55(H) 59(H) 24    Lipid Panel     Component Value Date/Time   CHOL 204 (H) 04/29/2019 0950   TRIG 280 (H)  04/29/2019 0950   HDL 29 (L) 04/29/2019 0950   CHOLHDL 7.0 (H) 04/29/2019 0950   CHOLHDL 6.5 07/25/2012 1047   VLDL 16 07/25/2012 1047   LDLCALC 125 (H) 04/29/2019 0950    CBC    Component Value Date/Time   WBC 11.2 (H) 03/14/2016 0756   RBC 3.98 (L) 03/14/2016 0756   HGB 12.6 (L) 03/14/2016 0756   HCT 38.2 (L) 03/14/2016 0756   PLT  212 03/14/2016 0756   MCV 96.0 03/14/2016 0756   MCH 31.7 03/14/2016 0756   MCHC 33.0 03/14/2016 0756   RDW 12.0 03/14/2016 0756    Lab Results  Component Value Date   HGBA1C 10.3 (A) 09/22/2019    Assessment & Plan:  1. Type 2 diabetes mellitus with hyperglycemia, without long-term current use of insulin (HCC) Uncontrolled with A1c of 10.3 Goal is less than 7.0 Discontinue Metformin as per patient request Lantus initiated, continue Victoza and glipizide Counseled on Diabetic diet, my plate method, 244 minutes of moderate intensity exercise/week Blood sugar logs with fasting goals of 80-120 mg/dl, random of less than 180 and in the event of sugars less than 60 mg/dl or greater than 400 mg/dl encouraged to notify the clinic. Advised on the need for annual eye exams, annual foot exams, Pneumonia vaccine. - POCT glucose (manual entry) - POCT glycosylated hemoglobin (Hb A1C) - glipiZIDE (GLUCOTROL) 10 MG tablet; Take 1 tablet (10 mg total) by mouth 2 (two) times daily before a meal.  Dispense: 180 tablet; Refill: 1 - insulin glargine (LANTUS SOLOSTAR) 100 UNIT/ML Solostar Pen; Inject 10 Units into the skin daily.  Dispense: 30 mL; Refill: 6 - liraglutide (VICTOZA) 18 MG/3ML SOPN; Inject 0.3 mLs (1.8 mg total) into the skin daily with breakfast.  Dispense: 30 mL; Refill: 6 - Insulin Pen Needle (TRUEPLUS PEN NEEDLES) 32G X 4 MM MISC; Use to inject insulin and Victoza twice daily  Dispense: 100 each; Refill: 6  2. Mixed hyperlipidemia Uncontrolled Regimen change performed after last lipid panel obtained Low-cholesterol diet and continue  Lipitor Lipid panel at next visit - atorvastatin (LIPITOR) 80 MG tablet; Take 1 tablet (80 mg total) by mouth daily.  Dispense: 90 tablet; Refill: 1  3. Candidal balanitis Previous history of candidal balanitis Requests refill of clotrimazole which has been performed - clotrimazole (LOTRIMIN) 1 % cream; Apply 1 application topically 2 (two) times daily.  Dispense: 60 g; Refill: 1  4. Essential hypertension Controlled Counseled on blood pressure goal of less than 130/80, low-sodium, DASH diet, medication compliance, 150 minutes of moderate intensity exercise per week. Discussed medication compliance, adverse effects. - lisinopril-hydrochlorothiazide (ZESTORETIC) 10-12.5 MG tablet; Take 1 tablet by mouth daily.  Dispense: 90 tablet; Refill: 1  5. Need for immunization against influenza - Flu Vaccine QUAD 36+ mos IM  6.  Suspected sleep apnea Referral has been previously placed Meds ordered this encounter  Medications  . atorvastatin (LIPITOR) 80 MG tablet    Sig: Take 1 tablet (80 mg total) by mouth daily.    Dispense:  90 tablet    Refill:  1    Dose increase  . cetirizine (ZYRTEC) 10 MG tablet    Sig: Take 1 tablet (10 mg total) by mouth daily.    Dispense:  30 tablet    Refill:  1  . clotrimazole (LOTRIMIN) 1 % cream    Sig: Apply 1 application topically 2 (two) times daily.    Dispense:  60 g    Refill:  1  . glipiZIDE (GLUCOTROL) 10 MG tablet    Sig: Take 1 tablet (10 mg total) by mouth 2 (two) times daily before a meal.    Dispense:  180 tablet    Refill:  1    Discontinue Metformin  . insulin glargine (LANTUS SOLOSTAR) 100 UNIT/ML Solostar Pen    Sig: Inject 10 Units into the skin daily.    Dispense:  30 mL    Refill:  6  .  liraglutide (VICTOZA) 18 MG/3ML SOPN    Sig: Inject 0.3 mLs (1.8 mg total) into the skin daily with breakfast.    Dispense:  30 mL    Refill:  6  . lisinopril-hydrochlorothiazide (ZESTORETIC) 10-12.5 MG tablet    Sig: Take 1 tablet by mouth  daily.    Dispense:  90 tablet    Refill:  1  . Insulin Pen Needle (TRUEPLUS PEN NEEDLES) 32G X 4 MM MISC    Sig: Use to inject insulin and Victoza twice daily    Dispense:  100 each    Refill:  6    Follow-up: Return in about 3 months (around 12/22/2019) for Chronic medical conditions.       Charlott Rakes, MD, FAAFP. [facility] and [place], [place]   09/22/2019, 10:10 AM

## 2019-09-22 NOTE — Telephone Encounter (Signed)
Done

## 2019-09-30 ENCOUNTER — Encounter: Payer: Self-pay | Admitting: Podiatry

## 2019-09-30 ENCOUNTER — Ambulatory Visit (INDEPENDENT_AMBULATORY_CARE_PROVIDER_SITE_OTHER): Payer: Medicare Other | Admitting: Podiatry

## 2019-09-30 ENCOUNTER — Other Ambulatory Visit: Payer: Self-pay

## 2019-09-30 DIAGNOSIS — M79674 Pain in right toe(s): Secondary | ICD-10-CM | POA: Diagnosis not present

## 2019-09-30 DIAGNOSIS — M79675 Pain in left toe(s): Secondary | ICD-10-CM | POA: Diagnosis not present

## 2019-09-30 DIAGNOSIS — B351 Tinea unguium: Secondary | ICD-10-CM | POA: Diagnosis not present

## 2019-09-30 DIAGNOSIS — E119 Type 2 diabetes mellitus without complications: Secondary | ICD-10-CM

## 2019-09-30 NOTE — Progress Notes (Signed)
This patient returns to my office for at risk foot care.  This patient requires this care by a professional since this patient will be at risk due to having  Diabetes.  This patient is unable to cut nails himself since the patient cannot reach his nails.These nails are painful walking and wearing shoes.  This patient presents for at risk foot care today.  General Appearance  Alert, conversant and in no acute stress.  Vascular  Dorsalis pedis and posterior tibial  pulses are palpable  bilaterally.  Capillary return is within normal limits  bilaterally. Temperature is within normal limits  bilaterally.  Neurologic  Senn-Weinstein monofilament wire test within normal limits  bilaterally. Muscle power within normal limits bilaterally.  Nails Thick disfigured discolored nails with subungual debris  from hallux to fifth toes bilaterally. No evidence of bacterial infection or drainage bilaterally.  Orthopedic  No limitations of motion  feet .  No crepitus or effusions noted.  No bony pathology or digital deformities noted.  Skin  normotropic skin with no porokeratosis noted bilaterally.  No signs of infections or ulcers noted.     Onychomycosis  Pain in right toes  Pain in left toes  Consent was obtained for treatment procedures.   Mechanical debridement of nails 1-5  bilaterally performed with a nail nipper.  Filed with dremel without incident.    Return office visit  3 months                    Told patient to return for periodic foot care and evaluation due to potential at risk complications.   Kenneth Lax DPM  

## 2019-10-27 ENCOUNTER — Other Ambulatory Visit: Payer: Self-pay | Admitting: Family Medicine

## 2019-11-06 ENCOUNTER — Telehealth: Payer: Self-pay | Admitting: Family Medicine

## 2019-11-06 NOTE — Telephone Encounter (Signed)
Requesting xray for left hip due to him having pain.  He states that he had an operation on his right hip years ago.

## 2019-11-06 NOTE — Telephone Encounter (Signed)
Copied from CRM [id number]. Topic: General - Other >> Nov 06, 2019 12:52 PM Brandon Finley wrote: Reason for CRM: Pt requests an order for an x-ray of left hip. Cb# [phone]

## 2019-11-10 NOTE — Telephone Encounter (Signed)
Will route to PCP for review.  Does patient need to come in for visit or virtual visit.

## 2019-11-10 NOTE — Telephone Encounter (Signed)
Either virtual or in person will work depending on his preference.

## 2019-11-11 ENCOUNTER — Ambulatory Visit (HOSPITAL_BASED_OUTPATIENT_CLINIC_OR_DEPARTMENT_OTHER): Payer: Medicare Other | Attending: Family Medicine | Admitting: Internal Medicine

## 2019-11-11 NOTE — Telephone Encounter (Signed)
Patient ws called and given appointment to discuss matter.

## 2019-11-20 ENCOUNTER — Other Ambulatory Visit: Payer: Self-pay | Admitting: Orthopaedic Surgery

## 2019-11-20 ENCOUNTER — Other Ambulatory Visit: Payer: Self-pay | Admitting: Family Medicine

## 2019-11-23 ENCOUNTER — Ambulatory Visit: Payer: Medicare Other | Attending: Family Medicine | Admitting: Family Medicine

## 2019-11-23 ENCOUNTER — Encounter: Payer: Self-pay | Admitting: Family Medicine

## 2019-11-23 ENCOUNTER — Other Ambulatory Visit: Payer: Self-pay

## 2019-11-23 DIAGNOSIS — M25552 Pain in left hip: Secondary | ICD-10-CM

## 2019-11-23 NOTE — Progress Notes (Signed)
Virtual Visit via Telephone Note  I connected with Brandon Finley, on 11/23/2019 at 8:32 AM by telephone due to the COVID-19 pandemic and verified that I am speaking with the correct person using two identifiers.   Consent: I discussed the limitations, risks, security and privacy concerns of performing an evaluation and management service by telephone and the availability of in person appointments. I also discussed with the patient that there may be a patient responsible charge related to this service. The patient expressed understanding and agreed to proceed.   Location of Patient: Home  Location of Provider: Clinic   Persons participating in Telemedicine visit: Atley Neubert [facility] Dr. 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 L hip has been painful intermittently for the last month and a hot shower provides relief.  Pain is rated as 8/10 and he uses methocarbamol and nabumetone for his pain.  He denies recent falls, numbness in his left lower extremity.   Past Medical History:  Diagnosis Date  . Arthritis   . Hypertension    Allergies  Allergen Reactions  . No Known Allergies     Current Outpatient Medications on File Prior to Visit  Medication Sig Dispense Refill  . aspirin EC 81 MG tablet Take 1 tablet (81 mg total) by mouth daily. 90 tablet 1  . atorvastatin (LIPITOR) 80 MG tablet Take 1 tablet (80 mg total) by mouth daily. 90 tablet 1  . Blood Glucose Monitoring Suppl (ONETOUCH VERIO) w/Device KIT Use to check blood sugar up to 3 times daily. 1 kit 0  . cetirizine (ZYRTEC) 10 MG tablet Take 1 tablet (10 mg total) by mouth daily. 30 tablet 1  . clotrimazole (LOTRIMIN) 1 % cream Apply 1 application topically 2 (two) times daily. 60 g 1   . glipiZIDE (GLUCOTROL) 10 MG tablet Take 1 tablet (10 mg total) by mouth 2 (two) times daily before a meal. 180 tablet 1  . glucose blood (ONETOUCH VERIO) test strip Use as instructed 100 each 11  . insulin glargine (LANTUS SOLOSTAR) 100 UNIT/ML Solostar Pen Inject 10 Units into the skin daily. 30 mL 6  . Insulin Pen Needle (TRUEPLUS PEN NEEDLES) 32G X 4 MM MISC Use to inject insulin and Victoza twice daily 100 each 6  . lidocaine (LIDODERM) 5 % Place 1 patch onto the skin daily. Remove & Discard patch within 12 hours or as directed by MD 30 patch 0  . liraglutide (VICTOZA) 18 MG/3ML SOPN Inject 0.3 mLs (1.8 mg total) into the skin daily with breakfast. 30 mL 6  . lisinopril-hydrochlorothiazide (ZESTORETIC) 10-12.5 MG tablet Take 1 tablet by mouth daily. 90 tablet 1  . metFORMIN (GLUCOPHAGE) 500 MG tablet Take 2 tablets by mouth twice daily with meals 120 tablet 0  . methocarbamol (ROBAXIN) 500 MG tablet Take 2 tablets (1,000 mg total) by mouth 2 (two) times daily. 120 tablet 2  . nabumetone (RELAFEN) 750 MG tablet Take 1 tablet by mouth twice daily as needed 60 tablet 11  . OneTouch Delica Lancets 38V MISC Use to check blood sugar up to 3 times daily. 100 each 11  . PEG 3350-KCl-Na Bicarb-NaCl (GAVILYTE-N WITH FLAVOR PACK PO)      No current facility-administered medications on file prior to visit.    Observations/Objective: Awake,  alert, and 2x3 Not in acute distress  Assessment and Plan: 1. Left hip pain Symptoms are suspicious for osteoarthritis We will order imaging to evaluate further He is currently under the care of orthopedics-Dr. Ninfa Linden I will refer him back to his orthopedic once x-ray result is obtained Continue nabumetone which he receives from orthopedics and continue methocarbamol which I have been prescribing. - XR HIPS BILAT W OR W/O PELVIS 3-4 VIEWS   Follow Up Instructions: Keep previously scheduled appointment   I discussed the assessment and treatment plan  with the patient. The patient was provided an opportunity to ask questions and all were answered. The patient agreed with the plan and demonstrated an understanding of the instructions.   The patient was advised to call back or seek an in-person evaluation if the symptoms worsen or if the condition fails to improve as anticipated.     I provided 11 minutes total of non-face-to-face time during this encounter including median intraservice time, reviewing previous notes, investigations, ordering medications, medical decision making, coordinating care and patient verbalized understanding at the end of the visit.     Charlott Rakes, MD, FAAFP. [facility] and [place], [place]   11/23/2019, 8:32 AM

## 2019-11-23 NOTE — Progress Notes (Signed)
Patient is having pain in left hip. Would like to get imaging done.

## 2019-11-24 ENCOUNTER — Emergency Department (HOSPITAL_COMMUNITY): Payer: Medicare Other

## 2019-11-24 ENCOUNTER — Other Ambulatory Visit: Payer: Self-pay

## 2019-11-24 ENCOUNTER — Encounter (HOSPITAL_COMMUNITY): Payer: Self-pay | Admitting: Emergency Medicine

## 2019-11-24 ENCOUNTER — Emergency Department (HOSPITAL_COMMUNITY)
Admission: EM | Admit: 2019-11-24 | Discharge: 2019-11-24 | Disposition: A | Discharge status: Home / Self Care | Payer: Medicare Other | Attending: Emergency Medicine | Admitting: Emergency Medicine

## 2019-11-24 DIAGNOSIS — Z79899 Other long term (current) drug therapy: Secondary | ICD-10-CM | POA: Diagnosis not present

## 2019-11-24 DIAGNOSIS — Z794 Long term (current) use of insulin: Secondary | ICD-10-CM | POA: Diagnosis not present

## 2019-11-24 DIAGNOSIS — Z7984 Long term (current) use of oral hypoglycemic drugs: Secondary | ICD-10-CM | POA: Insufficient documentation

## 2019-11-24 DIAGNOSIS — E119 Type 2 diabetes mellitus without complications: Secondary | ICD-10-CM | POA: Diagnosis not present

## 2019-11-24 DIAGNOSIS — Z96641 Presence of right artificial hip joint: Secondary | ICD-10-CM | POA: Insufficient documentation

## 2019-11-24 DIAGNOSIS — M25552 Pain in left hip: Secondary | ICD-10-CM | POA: Diagnosis not present

## 2019-11-24 DIAGNOSIS — I10 Essential (primary) hypertension: Secondary | ICD-10-CM | POA: Diagnosis not present

## 2019-11-24 DIAGNOSIS — Z87891 Personal history of nicotine dependence: Secondary | ICD-10-CM | POA: Insufficient documentation

## 2019-11-24 DIAGNOSIS — Z7982 Long term (current) use of aspirin: Secondary | ICD-10-CM | POA: Diagnosis not present

## 2019-11-24 DIAGNOSIS — M533 Sacrococcygeal disorders, not elsewhere classified: Secondary | ICD-10-CM | POA: Diagnosis not present

## 2019-11-24 DIAGNOSIS — M1612 Unilateral primary osteoarthritis, left hip: Secondary | ICD-10-CM | POA: Diagnosis not present

## 2019-11-24 DIAGNOSIS — M47816 Spondylosis without myelopathy or radiculopathy, lumbar region: Secondary | ICD-10-CM | POA: Diagnosis not present

## 2019-11-24 DIAGNOSIS — R030 Elevated blood-pressure reading, without diagnosis of hypertension: Secondary | ICD-10-CM

## 2019-11-24 DIAGNOSIS — I878 Other specified disorders of veins: Secondary | ICD-10-CM | POA: Diagnosis not present

## 2019-11-24 MED ORDER — KETOROLAC TROMETHAMINE 30 MG/ML IJ SOLN
30.0000 mg | Freq: Once | INTRAMUSCULAR | Status: AC
  Administered 2019-11-24: 30 mg via INTRAMUSCULAR
  Filled 2019-11-24: qty 1

## 2019-11-24 NOTE — ED Triage Notes (Signed)
Pt coming from home. Pt complaint of left hip pain for several weeks. VSS. NAD.

## 2019-11-24 NOTE — Discharge Instructions (Addendum)
At this time there does not appear to be the presence of an emergent medical condition, however there is always the potential for conditions to change. Please read and follow the below instructions.  Please return to the Emergency Department immediately for any new or worsening symptoms. Please be sure to follow up with your Primary Care Provider within one week regarding your visit today; please call their office to schedule an appointment even if you are feeling better for a follow-up visit. Please call your orthopedic specialist Dr. Magnus Ivan today to schedule a follow-up appointment for further evaluation and treatment of your hip pain. You have been given an NSAID-containing medication called Toradol today.  Do not take the medications including ibuprofen, Aleve, Advil, naproxen or other NSAID-containing medications for the next 24 hours.  Please be sure to drink plenty of water over the next few days. You may continue taking your NSAID nabumetone starting tomorrow afternoon.  You may continue taking your muscle relaxer methocarbamol as prescribed.  Methocarbamol may make you drowsy so do not drive, drink alcohol or perform any dangerous activities after taking this muscle relaxer. Your blood pressure was high in the emergency department, please have it rechecked by your primary care provider at your follow-up visit and discuss medication management if indicated at that time.  Go to the nearest Emergency Department immediately if: You have fever or chills You fall. You have a sudden increase in pain and swelling in your hip. Your hip is red or swollen or very tender to touch. You have abdominal pain, nausea or vomiting You have numbness or weakness You lose control of your bladder or bowels (You urinate or poop on yourself) You have any new/concerning or worsening of symptoms  Please read the additional information packets attached to your discharge summary.  Do not take your medicine if   develop an itchy rash, swelling in your mouth or lips, or difficulty breathing; call 911 and seek immediate emergency medical attention if this occurs.  You may review your lab tests and imaging results in their entirety on your MyChart account.  Please discuss all results of fully with your primary care provider and other specialist at your follow-up visit.  Note: Portions of this text may have been transcribed using voice recognition software. Every effort was made to ensure accuracy; however, inadvertent computerized transcription errors may still be present.

## 2019-11-24 NOTE — ED Provider Notes (Signed)
Brandon Finley   CSN: [id number] Arrival date & time: 11/24/19  1059     History Chief Complaint  Patient presents with  . Hip Pain    Brandon Finley is a 59 y.o. male history of arthritis, status post right hip replacement, diabetes, morbid obesity, hypertension, hyperlipidemia.  Patient reports around 1 month ago he developed a left hip pain which feels similar to his prior right hip pain before his replacement.  He describes a sharp pain to the left hip worse with ambulation improved with rest and with hot baths, pain is moderate intensity nonradiating and intermittent only with ambulation.  Denies fall/injury, fever/chills, numbness/weakness, tingling, swelling/color change, back pain, incontinence, urinary retention, saddle or paresthesias, abdominal pain or any additional concerns.  Patient reports that he was seen via televisit yesterday and had x-ray ordered but did not want to wait due to pain which is why came in today.  HPI     Past Medical History:  Diagnosis Date  . Arthritis   . Hypertension     Patient Active Problem List   Diagnosis Date Noted  . Hyperlipidemia 07/10/2018  . Hypertension 06/22/2016  . 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 Post-traumatic osteoarthritis of right hip 09/14/2015  . Right hip pain 09/14/2015  . Type 2 diabetes mellitus ([facility]) 07/25/2012  . Morbid obesity ([facility]) 07/25/2012    Past Surgical History:  Procedure Laterality Date  . TOTAL HIP ARTHROPLASTY Right 03/13/2016   Procedure: RIGHT TOTAL HIP ARTHROPLASTY ANTERIOR APPROACH;  Surgeon: Mcarthur Rossetti, MD;  Location: [facility];  Service: Orthopedics;  Laterality: Right;       Family History  Problem Relation Age of Onset  . Hypertension Father     Social History   Tobacco Use  . Smoking status: Former Smoker    Quit date: 01/16/1995    Years since quitting: 24.8  . Smokeless tobacco: Never  Used  Substance Use Topics  . Alcohol use: No  . Drug use: No    Home Medications Prior to Admission medications   Medication Sig Start Date End Date Taking? Authorizing Provider  aspirin EC 81 MG tablet Take 1 tablet (81 mg total) by mouth daily. 06/05/17   Charlott Rakes, MD  atorvastatin (LIPITOR) 80 MG tablet Take 1 tablet (80 mg total) by mouth daily. 09/22/19   Charlott Rakes, MD  Blood Glucose Monitoring Suppl (ONETOUCH VERIO) w/Device KIT Use to check blood sugar up to 3 times daily. 08/11/18   Charlott Rakes, MD  cetirizine (ZYRTEC) 10 MG tablet Take 1 tablet (10 mg total) by mouth daily. 09/22/19   Charlott Rakes, MD  clotrimazole (LOTRIMIN) 1 % cream Apply 1 application topically 2 (two) times daily. 09/22/19   Charlott Rakes, MD  glipiZIDE (GLUCOTROL) 10 MG tablet Take 1 tablet (10 mg total) by mouth 2 (two) times daily before a meal. 09/22/19   Charlott Rakes, MD  glucose blood (ONETOUCH VERIO) test strip Use as instructed 08/11/18   Charlott Rakes, MD  insulin glargine (LANTUS SOLOSTAR) 100 UNIT/ML Solostar Pen Inject 10 Units into the skin daily. 09/22/19   Charlott Rakes, MD  Insulin Pen Needle (TRUEPLUS PEN NEEDLES) 32G X 4 MM MISC Use to inject insulin and Victoza twice daily 09/22/19   Charlott Rakes, MD  lidocaine (LIDODERM) 5 % Place 1 patch onto the skin daily. Remove & Discard patch within 12 hours or as directed by MD 03/12/19   Caccavale, Sophia,  PA-C  liraglutide (VICTOZA) 18 MG/3ML SOPN Inject 0.3 mLs (1.8 mg total) into the skin daily with breakfast. 09/22/19   Charlott Rakes, MD  lisinopril-hydrochlorothiazide (ZESTORETIC) 10-12.5 MG tablet Take 1 tablet by mouth daily. 09/22/19   Charlott Rakes, MD  metFORMIN (GLUCOPHAGE) 500 MG tablet Take 2 tablets by mouth twice daily with meals 11/20/19   Charlott Rakes, MD  methocarbamol (ROBAXIN) 500 MG tablet Take 2 tablets (1,000 mg total) by mouth 2 (two) times daily. 03/04/19   Charlott Rakes, MD  nabumetone (RELAFEN) 750 MG  tablet Take 1 tablet by mouth twice daily as needed 11/20/19   Mcarthur Rossetti, MD  OneTouch Delica Lancets 08M MISC Use to check blood sugar up to 3 times daily. 08/11/18   Charlott Rakes, MD  PEG 3350-KCl-Na Bicarb-NaCl (GAVILYTE-N WITH FLAVOR PACK PO)  06/19/18   [provider]    Allergies    No known allergies  Review of Systems   Review of Systems  Constitutional: Negative.  Negative for chills and fever.  Cardiovascular: Negative.   Gastrointestinal: Negative for abdominal pain.  Musculoskeletal: Positive for arthralgias (Left hip). Negative for back pain and neck pain.  Skin: Negative.  Negative for color change and wound.  Neurological: Negative.  Negative for weakness and numbness.       Denies saddle area paresthesias. Denies bowel/bladder incontinence. Denies urinary retention.     Physical Exam Updated Vital Signs BP (!) 159/78 (BP Location: Right Arm)   Pulse 86   Temp 99 F (37.2 C) (Oral)   Resp 18   Ht '6\' 1"'  (1.854 m)   Wt 136.1 kg   SpO2 97%   BMI 39.58 kg/m   Physical Exam Constitutional:      General: He is not in acute distress.    Appearance: Normal appearance. He is well-developed. He is not ill-appearing or diaphoretic.  HENT:     Head: Normocephalic and atraumatic.  Eyes:     General: Vision grossly intact. Gaze aligned appropriately.     Pupils: Pupils are equal, round, and reactive to light.  Neck:     Trachea: Trachea and phonation normal.  Cardiovascular:     Pulses:          Dorsalis pedis pulses are 2+ on the right side and 2+ on the left side.  Pulmonary:     Effort: Pulmonary effort is normal. No respiratory distress.  Abdominal:     General: There is no distension.     Palpations: Abdomen is soft.     Tenderness: There is no abdominal tenderness. There is no guarding or rebound.  Musculoskeletal:        General: Normal range of motion.     Cervical back: Normal range of motion.       Legs:     Comments: No  midline C/T/L spinal tenderness to palpation, no paraspinal muscle tenderness, no deformity, crepitus, or step-off noted. No sign of injury to the neck or back. - Left gluteal tenderness to palpation without overlying skin change.  Range of motion at the left hip intact with mild increase in pain, appropriate range of motion and strength at the knee ankle and foot without pain.  Feet:     Right foot:     Protective Sensation: 3 sites tested. 3 sites sensed.     Left foot:     Protective Sensation: 3 sites tested. 3 sites sensed.  Skin:    General: Skin is warm and dry.  Neurological:  Mental Status: He is alert.     GCS: GCS eye subscore is 4. GCS verbal subscore is 5. GCS motor subscore is 6.     Comments: Speech is clear and goal oriented, follows commands Major Cranial nerves without deficit, no facial droop Moves extremities without ataxia, coordination intact  Psychiatric:        Behavior: Behavior normal.     ED Results / Procedures / Treatments   Labs (all labs ordered are listed, but only abnormal results are displayed) Labs Reviewed - No data to display  EKG None  Radiology DG Hip Unilat W or Wo Pelvis 2-3 Views Left  Result Date: 11/24/2019 CLINICAL DATA:  LEFT hip pain, history of RIGHT hip replacement EXAM: DG HIP (WITH OR WITHOUT PELVIS) 2-3V LEFT COMPARISON:  03/12/2019 FINDINGS: Osseous mineralization low normal. RIGHT hip prosthesis seen. Joint space narrowing LEFT hip joint. SI joints preserved. No acute fracture, dislocation, or bone destruction. Degenerative changes at visualized lumbar spine. Scattered pelvic phleboliths. IMPRESSION: Degenerative changes of LEFT hip joint. Prior RIGHT hip arthroplasty. Electronically Signed   By: Lavonia Dana M.D.   On: 11/24/2019 13:05    Procedures Procedures (including critical care time)  Medications Ordered in ED Medications  ketorolac (TORADOL) 30 MG/ML injection 30 mg (has no administration in time range)    ED  Course  I have reviewed the triage vital signs and the nursing notes.  Pertinent labs & imaging results that were available during my care of the patient were reviewed by me and considered in my medical decision making (see chart for details).    MDM Rules/Calculators/A&P                         Additional history obtained from: 1. Nursing notes from this visit. 2. Review of electronic medical records.  Patient had telemedicine visit yesterday.  Diagnosis left hip pain.  X-ray of the left hip and pelvis was ordered.  They plan referral back to Dr. Ninfa Linden who performed patient's right hip arthroplasty.  They were having patient continue nabumetone and methocarbamol. ---------------------- 59 year old male presented for left hip pain, no fall or injury.  No neurologic complaint or infectious symptoms.  On exam he has mild tenderness of the gluteal muscles without overlying skin change.  X-ray of the pelvis and left hip was obtained, radiologist report below,  IMPRESSION:  Degenerative changes of LEFT hip joint.    Prior RIGHT hip arthroplasty.   History and examination today is consistent with osteoarthritis of the left hip.  No evidence of septic arthritis, DVT, compartment syndrome, ischemia or other emergent pathologies at this time.  Additionally no back pain or neurologic complaint to suggest cauda equina.  No abdominal pain to suggest AAA or urinary symptoms to suggest kidney stone disease.  Patient has been prescribed muscle relaxers and NSAIDs by his primary care provider which he will continue.  Encouraged patient to call Dr. Trevor Mace office today to schedule follow-up appointment for further management.  I offered patient crutches today for comfort which he refused.  Patient seen and evaluated by Dr. Gilford Raid who agrees with discharge and orthopedic follow-up. Additionally patient received Toradol in the ER today advised to hold NSAID for 24 hours.  Incidentally patient noted to  have's elevated blood pressure today, he is asymptomatic regarding blood pressure reading.  Encourage PCP follow-up for blood pressure recheck and medication management.  At this time there does not appear to be any evidence of  an acute emergency medical condition and the patient appears stable for discharge with appropriate outpatient follow up. Diagnosis was discussed with patient who verbalizes understanding of care plan and is agreeable to discharge. I have discussed return precautions with patient who verbalizes understanding. Patient encouraged to follow-up with their PCP and Dr. Ninfa Linden. All questions answered.   Finley: Portions of this report may have been transcribed using voice recognition software. Every effort was made to ensure accuracy; however, inadvertent computerized transcription errors may still be present. Final Clinical Impression(s) / ED Diagnoses Final diagnoses:  Left hip pain  Elevated blood pressure reading    Rx / DC Orders ED Discharge Orders    None       Gari Crown 11/24/19 1336    Isla Pence, MD 11/24/19 1505

## 2019-11-25 ENCOUNTER — Ambulatory Visit (INDEPENDENT_AMBULATORY_CARE_PROVIDER_SITE_OTHER): Payer: Medicare Other | Admitting: Orthopaedic Surgery

## 2019-11-25 ENCOUNTER — Other Ambulatory Visit: Payer: Self-pay

## 2019-11-25 ENCOUNTER — Encounter: Payer: Self-pay | Admitting: Orthopaedic Surgery

## 2019-11-25 VITALS — Ht 73.0 in | Wt 313.0 lb

## 2019-11-25 DIAGNOSIS — M5442 Lumbago with sciatica, left side: Secondary | ICD-10-CM | POA: Diagnosis not present

## 2019-11-25 DIAGNOSIS — M4807 Spinal stenosis, lumbosacral region: Secondary | ICD-10-CM

## 2019-11-25 DIAGNOSIS — M25552 Pain in left hip: Secondary | ICD-10-CM | POA: Diagnosis not present

## 2019-11-25 NOTE — Progress Notes (Signed)
Office Visit Note   Patient: Brandon Finley           Date of Birth: 03/06/1960           MRN: [id number] Visit Date: 11/25/2019              Requested by: Hoy Register, MD [address] [place],  [place] [address] PCP: Hoy Register, MD   Assessment & Plan: Visit Diagnoses:  1. Pain in left hip   2. Acute left-sided low back pain with left-sided sciatica     Plan: Based on my exam today, we need to obtain a MRI of the lumbar spine to rule out any nerve compression that is affecting his left side.  I do not feel that right now a hip replacement is needed.  Also, he is not a candidate for hip replacement surgery given his BMI of 41.3 but mainly due to his poorly controlled blood glucose.  His hemoglobin A1c would need to be 7.5 or below before we even consider joint replacement surgery.  Right now, we will obtain an MRI of the lumbar spine and develop treatment plan from there.  All question concerns were answered and addressed.  He is to call for follow-up once we have a MRI of his lumbar spine.  Follow-Up Instructions: No follow-ups on file.   Orders:  No orders of the defined types were placed in this encounter.  No orders of the defined types were placed in this encounter.     Procedures: No procedures performed   Clinical Data: No additional findings.   Subjective: Chief Complaint  Patient presents with  . Left Hip - Pain  The patient is someone I have seen before.  He actually went to the emergency room yesterday due to left hip pain but he points to the low and upper pelvis the left side is the source of his pain.  He reports minimal left groin pain.  We actually replaced his right hip in 2018 due to severe end-stage arthritis.  He is someone who is BMI is 41.3.  His last hemoglobin A1c was over 10.  His blood glucose control has been not good at all.  He was told from the emergency room that his pain is mainly from his left hip arthritis.  However, his  signs and symptoms today point to this being more of a back issue.  He does report poor blood glucose control.  HPI  Review of Systems He currently denies any headache, chest pain, shortness of breath, fever, chills, nausea, vomiting  Objective: Vital Signs: Ht 6\' 1"  (1.854 m)   Wt (!) 313 lb (142 kg)   BMI 41.30 kg/m   Physical Exam He is alert and orient x3 and in no acute distress Ortho Exam He is morbidly obese on exam.  I can actually easily put his left hip through internal and external rotation with minimal discomfort.  There is no pain to palpation over the trochanteric area of his hip but his pain seems to be in the facet joints at the lower lumbar spine into the SI joint and pelvis to the left. Specialty Comments:  No specialty comments available.  Imaging: No results found. X-rays independently reviewed of his lumbar spine from earlier this year show facet joint arthritic changes in the lower facet joints at L4 for L5 and L5-S1.  Plain films yesterday of his pelvis and left hip does show superior lateral joint space narrowing and osteoarthritis in that  hip that is moderate.  PMFS History: Patient Active Problem List   Diagnosis Date Noted  . Hyperlipidemia 07/10/2018  . Hypertension 06/22/2016  . 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 Post-traumatic osteoarthritis of right hip 09/14/2015  . Right hip pain 09/14/2015  . Type 2 diabetes mellitus ([facility]) 07/25/2012  . Morbid obesity ([facility]) 07/25/2012   Past Medical History:  Diagnosis Date  . Arthritis   . Hypertension     Family History  Problem Relation Age of Onset  . Hypertension Father     Past Surgical History:  Procedure Laterality Date  . TOTAL HIP ARTHROPLASTY Right 03/13/2016   Procedure: RIGHT TOTAL HIP ARTHROPLASTY ANTERIOR APPROACH;  Surgeon: Kathryne Hitch, MD;  Location: [facility];  Service: Orthopedics;  Laterality: Right;   Social History   Occupational History  .  Occupation: Company secretary   Tobacco Use  . Smoking status: Former Smoker    Quit date: 01/16/1995    Years since quitting: 24.8  . Smokeless tobacco: Never Used  Substance and Sexual Activity  . Alcohol use: No  . Drug use: No  . Sexual activity: Never

## 2019-12-22 ENCOUNTER — Other Ambulatory Visit: Payer: Medicare Other

## 2019-12-22 ENCOUNTER — Ambulatory Visit: Payer: Medicare Other | Admitting: Family Medicine

## 2019-12-26 ENCOUNTER — Other Ambulatory Visit: Payer: Self-pay | Admitting: Family Medicine

## 2019-12-28 ENCOUNTER — Encounter: Payer: Self-pay | Admitting: Orthopaedic Surgery

## 2019-12-28 ENCOUNTER — Ambulatory Visit (INDEPENDENT_AMBULATORY_CARE_PROVIDER_SITE_OTHER): Payer: Medicare Other | Admitting: Orthopaedic Surgery

## 2019-12-28 VITALS — Ht 73.0 in | Wt 317.0 lb

## 2019-12-28 DIAGNOSIS — M4807 Spinal stenosis, lumbosacral region: Secondary | ICD-10-CM

## 2019-12-28 DIAGNOSIS — M5442 Lumbago with sciatica, left side: Secondary | ICD-10-CM | POA: Diagnosis not present

## 2019-12-28 NOTE — Progress Notes (Signed)
The patient is coming in for continued follow-up as relates to low back pain with left-sided sciatica.  We will work on obtaining an MRI of his lumbar spine but that did not happen.  [facility]  He is morbidly obese with a BMI today of almost 42.  He does report that his symptoms have subsided quite a bit and now he only has low back pain and no sciatic component.  He still work on activity modification and is a [employer]  He is trying to lose more weight.  His last hemoglobin A1c in April was over 10.  He does not check his blood glucose regularly and has not had his hemoglobin A1c checked since then.  I spoke to him in detail about medical compliance and needing to get his health in better shape.  I advocated still weight loss and him getting back to the [facility] as soon as he could.  Right now, I would hold off on MRI of the lumbar spine since he is clinically doing much better.  On exam he has a negative straight leg raise and his only pain is in the lumbar spine to the left side.  There is no radicular component today.  He has significant truncal obesity.  I talked to him in detail about his health status and working on weight loss and getting in shape.  For now, follow-up can be as needed.

## 2019-12-30 ENCOUNTER — Encounter: Payer: Self-pay | Admitting: Podiatry

## 2019-12-30 ENCOUNTER — Other Ambulatory Visit: Payer: Self-pay

## 2019-12-30 ENCOUNTER — Ambulatory Visit (INDEPENDENT_AMBULATORY_CARE_PROVIDER_SITE_OTHER): Payer: Medicare Other | Admitting: Podiatry

## 2019-12-30 DIAGNOSIS — E119 Type 2 diabetes mellitus without complications: Secondary | ICD-10-CM | POA: Diagnosis not present

## 2019-12-30 DIAGNOSIS — M79675 Pain in left toe(s): Secondary | ICD-10-CM | POA: Diagnosis not present

## 2019-12-30 DIAGNOSIS — B351 Tinea unguium: Secondary | ICD-10-CM | POA: Diagnosis not present

## 2019-12-30 DIAGNOSIS — M79674 Pain in right toe(s): Secondary | ICD-10-CM

## 2019-12-30 NOTE — Progress Notes (Signed)
This patient returns to my office for at risk foot care.  This patient requires this care by a professional since this patient will be at risk due to having  Diabetes.  This patient is unable to cut nails himself since the patient cannot reach his nails.These nails are painful walking and wearing shoes.  This patient presents for at risk foot care today.  General Appearance  Alert, conversant and in no acute stress.  Vascular  Dorsalis pedis and posterior tibial  pulses are palpable  bilaterally.  Capillary return is within normal limits  bilaterally. Temperature is within normal limits  bilaterally.  Neurologic  Senn-Weinstein monofilament wire test within normal limits  bilaterally. Muscle power within normal limits bilaterally.  Nails Thick disfigured discolored nails with subungual debris  from hallux to fifth toes bilaterally. No evidence of bacterial infection or drainage bilaterally.  Orthopedic  No limitations of motion  feet .  No crepitus or effusions noted.  No bony pathology or digital deformities noted.  Skin  normotropic skin with no porokeratosis noted bilaterally.  No signs of infections or ulcers noted.     Onychomycosis  Pain in right toes  Pain in left toes  Consent was obtained for treatment procedures.   Mechanical debridement of nails 1-5  bilaterally performed with a nail nipper.  Filed with dremel without incident.    Return office visit  3 months                    Told patient to return for periodic foot care and evaluation due to potential at risk complications.   Modest Draeger DPM  

## 2020-01-25 ENCOUNTER — Other Ambulatory Visit: Payer: Self-pay | Admitting: Family Medicine

## 2020-02-09 ENCOUNTER — Ambulatory Visit: Payer: Medicare Other | Admitting: Family Medicine

## 2020-03-29 ENCOUNTER — Ambulatory Visit: Payer: Medicare Other | Admitting: Podiatry

## 2020-03-29 ENCOUNTER — Telehealth: Payer: Self-pay | Admitting: Family Medicine

## 2020-03-29 NOTE — Telephone Encounter (Signed)
Pt dropped off application for [employer] [place]. Pt wants PCP To know his Hip is hurting and his car is not working so it is important for him to have PCP fill page 2 and 3 (PART B), signed on page 3 and faxed to [phone] asap please. Pt has an appt w/PCP on 04/06/2020. Please advise and thank you

## 2020-03-31 NOTE — Telephone Encounter (Signed)
Paperwork has been placed on your desk.

## 2020-04-04 ENCOUNTER — Telehealth: Payer: Self-pay | Admitting: Licensed Clinical Social Worker

## 2020-04-04 NOTE — Telephone Encounter (Signed)
A completed [employer] [place] application submitted to Eligibility Department via fax at [phone]. LCSW left message informing pt of submission of application.

## 2020-04-05 ENCOUNTER — Ambulatory Visit: Payer: Medicare Other | Admitting: Podiatry

## 2020-04-06 ENCOUNTER — Other Ambulatory Visit: Payer: Self-pay

## 2020-04-06 ENCOUNTER — Other Ambulatory Visit: Payer: Self-pay | Admitting: Family Medicine

## 2020-04-06 ENCOUNTER — Encounter: Payer: Self-pay | Admitting: Family Medicine

## 2020-04-06 ENCOUNTER — Ambulatory Visit: Payer: No Typology Code available for payment source | Attending: Family Medicine | Admitting: Family Medicine

## 2020-04-06 VITALS — BP 142/82 | HR 79 | Ht 73.0 in | Wt 310.2 lb

## 2020-04-06 DIAGNOSIS — E782 Mixed hyperlipidemia: Secondary | ICD-10-CM | POA: Diagnosis not present

## 2020-04-06 DIAGNOSIS — B3742 Candidal balanitis: Secondary | ICD-10-CM | POA: Diagnosis not present

## 2020-04-06 DIAGNOSIS — E1165 Type 2 diabetes mellitus with hyperglycemia: Secondary | ICD-10-CM

## 2020-04-06 DIAGNOSIS — I10 Essential (primary) hypertension: Secondary | ICD-10-CM

## 2020-04-06 DIAGNOSIS — Z6841 Body Mass Index (BMI) 40.0 and over, adult: Secondary | ICD-10-CM

## 2020-04-06 LAB — GLUCOSE, POCT (MANUAL RESULT ENTRY): POC Glucose: 172 mg/dl — AB (ref 70–99)

## 2020-04-06 LAB — POCT GLYCOSYLATED HEMOGLOBIN (HGB A1C): HbA1c, POC (controlled diabetic range): 8.7 % — AB (ref 0.0–7.0)

## 2020-04-06 MED ORDER — LANTUS SOLOSTAR 100 UNIT/ML ~~LOC~~ SOPN: 10.0000 [IU] | PEN_INJECTOR | Freq: Every day | SUBCUTANEOUS | 6 refills | Status: DC

## 2020-04-06 MED ORDER — CLOTRIMAZOLE 1 % EX CREA: 1.0000 "application " | TOPICAL_CREAM | Freq: Two times a day (BID) | CUTANEOUS | 1 refills | Status: DC

## 2020-04-06 MED ORDER — GLIPIZIDE 10 MG PO TABS: 10.0000 mg | ORAL_TABLET | Freq: Two times a day (BID) | ORAL | 1 refills | Status: DC

## 2020-04-06 MED ORDER — VICTOZA 18 MG/3ML ~~LOC~~ SOPN: 1.8000 mg | PEN_INJECTOR | Freq: Every day | SUBCUTANEOUS | 6 refills | Status: DC

## 2020-04-06 MED ORDER — ATORVASTATIN CALCIUM 80 MG PO TABS: 80.0000 mg | ORAL_TABLET | Freq: Every day | ORAL | 1 refills | Status: DC

## 2020-04-06 MED ORDER — DAPAGLIFLOZIN PROPANEDIOL 10 MG PO TABS: 10.0000 mg | ORAL_TABLET | Freq: Every day | ORAL | 6 refills | Status: DC

## 2020-04-06 MED ORDER — LISINOPRIL-HYDROCHLOROTHIAZIDE 10-12.5 MG PO TABS: 1.0000 | ORAL_TABLET | Freq: Every day | ORAL | 1 refills | Status: DC

## 2020-04-06 MED FILL — FARXIGA 10 MG TABLET: 10 | 90 days supply | Qty: 90 | Fill #0

## 2020-04-06 MED FILL — LANTUS SOLOSTAR 100 UNITS/M: 100 | 90 days supply | Qty: 9 | Fill #0

## 2020-04-06 MED FILL — LISINOPRIL-HCTZ 10-12.5 MG: 10-12.5 | 90 days supply | Qty: 90 | Fill #0

## 2020-04-06 MED FILL — ATORVASTATIN CALCIUM 80 MG: 80 | 90 days supply | Qty: 90 | Fill #0

## 2020-04-06 MED FILL — VICTOZA 18 MG/3 ML INJECT P: 18 | 90 days supply | Qty: 27 | Fill #0

## 2020-04-06 MED FILL — glipiZIDE 10 MG TABS: 10 | 90 days supply | Qty: 180 | Fill #0

## 2020-04-06 NOTE — Progress Notes (Signed)
Needs refill on Lotrimin cream.

## 2020-04-06 NOTE — Progress Notes (Signed)
Subjective:  Patient ID: JASIYAH PAULDING, male    DOB: [birth year]  Age: 60 y.o. 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 Has been going to the Gym regularly. A1c is 8.7 down from 10.3 previously and he has been compliant with his diabetic medications.  Doing well on his antihypertensive and his statin. Metformin gives him a headache and makes him drowsy so he discontinued it. Denies presence of visual abnormalities or numbness in extremities.  His car broke down and he is trying to obtain assistance with transportation to [employer]. He is requesting refill of clotrimazole for his balanitis.  Past Medical History:  Diagnosis Date  . Arthritis   . Hypertension     Past Surgical History:  Procedure Laterality Date  . TOTAL HIP ARTHROPLASTY Right 03/13/2016   Procedure: RIGHT TOTAL HIP ARTHROPLASTY ANTERIOR APPROACH;  Surgeon: Mcarthur Rossetti, MD;  Location: [facility];  Service: Orthopedics;  Laterality: Right;    Family History  Problem Relation Age of Onset  . Hypertension Father     Allergies  Allergen Reactions  . No Known Allergies     Outpatient Medications Prior to Visit  Medication Sig Dispense Refill  . aspirin EC 81 MG tablet Take 1 tablet (81 mg total) by mouth daily. 90 tablet 1  . atorvastatin (LIPITOR) 80 MG tablet Take 1 tablet (80 mg total) by mouth daily. 90 tablet 1  . Blood Glucose Monitoring Suppl (ONETOUCH VERIO) w/Device KIT Use to check blood sugar up to 3 times daily. 1 kit 0  . cetirizine (ZYRTEC) 10 MG tablet Take 1 tablet (10 mg total) by mouth daily. 30 tablet 1  . clotrimazole (LOTRIMIN) 1 % cream Apply 1 application topically 2 (two) times daily. 60 g 1  . glipiZIDE  (GLUCOTROL) 10 MG tablet Take 1 tablet (10 mg total) by mouth 2 (two) times daily before a meal. 180 tablet 1  . glucose blood (ONETOUCH VERIO) test strip Use as instructed 100 each 11  . insulin glargine (LANTUS SOLOSTAR) 100 UNIT/ML Solostar Pen Inject 10 Units into the skin daily. 30 mL 6  . Insulin Pen Needle (TRUEPLUS PEN NEEDLES) 32G X 4 MM MISC Use to inject insulin and Victoza twice daily 100 each 6  . lidocaine (LIDODERM) 5 % Place 1 patch onto the skin daily. Remove & Discard patch within 12 hours or as directed by MD 30 patch 0  . liraglutide (VICTOZA) 18 MG/3ML SOPN Inject 0.3 mLs (1.8 mg total) into the skin daily with breakfast. 30 mL 6  . lisinopril-hydrochlorothiazide (ZESTORETIC) 10-12.5 MG tablet Take 1 tablet by mouth daily. 90 tablet 1  . methocarbamol (ROBAXIN) 500 MG tablet Take 2 tablets (1,000 mg total) by mouth 2 (two) times daily. 120 tablet 2  . nabumetone (RELAFEN) 750 MG tablet Take 1 tablet by mouth twice daily as needed 60 tablet 11  . OneTouch Delica Lancets 70V MISC Use to check blood sugar up to 3 times daily. 100 each 11  . PEG 3350-KCl-Na Bicarb-NaCl (GAVILYTE-N WITH FLAVOR PACK PO)     . metFORMIN (GLUCOPHAGE) 500 MG tablet Take 2 tablets by mouth twice daily with meals (Patient not taking: Reported on 04/06/2020) 120 tablet 0   No facility-administered medications  prior to visit.     ROS Review of Systems  Constitutional: Negative for activity change and appetite change.  HENT: Negative for sinus pressure and sore throat.   Eyes: Negative for visual disturbance.  Respiratory: Negative for cough, chest tightness and shortness of breath.   Cardiovascular: Negative for chest pain and leg swelling.  Gastrointestinal: Negative for abdominal distention, abdominal pain, constipation and diarrhea.  Endocrine: Negative.   Genitourinary: Negative for dysuria.  Musculoskeletal: Negative for joint swelling and myalgias.  Skin: Negative for rash.   Allergic/Immunologic: Negative.   Neurological: Negative for weakness, light-headedness and numbness.  Psychiatric/Behavioral: Negative for dysphoric mood and suicidal ideas.    Objective:  BP (!) 185/66   Pulse 79   Ht '6\' 1"'  (1.854 m)   Wt (!) 310 lb 3.2 oz (140.7 kg)   SpO2 96%   BMI 40.93 kg/m   BP/Weight 04/06/2020 12/28/2019 33/54/5625  Systolic BP 638 - -  Diastolic BP 66 - -  Wt. (Lbs) 310.2 317 313  BMI 40.93 41.82 41.3      Physical Exam Constitutional:      Appearance: He is well-developed. He is obese.  Neck:     Vascular: No JVD.  Cardiovascular:     Rate and Rhythm: Normal rate.     Heart sounds: Normal heart sounds. No murmur heard.   Pulmonary:     Effort: Pulmonary effort is normal.     Breath sounds: Normal breath sounds. No wheezing or rales.  Chest:     Chest wall: No tenderness.  Abdominal:     General: Bowel sounds are normal. There is no distension.     Palpations: Abdomen is soft. There is no mass.     Tenderness: There is no abdominal tenderness.  Musculoskeletal:        General: Normal range of motion.     Right lower leg: No edema.     Left lower leg: No edema.  Neurological:     Mental Status: He is alert and oriented to person, place, and time.  Psychiatric:        Mood and Affect: Mood normal.     CMP Latest Ref Rng & Units 04/29/2019 07/10/2018 06/05/2017  Glucose 65 - 99 mg/dL 348(H) 258(H) 84  BUN 6 - 24 mg/dL '6 12 7  ' Creatinine 0.76 - 1.27 mg/dL 0.68(L) 0.90 0.66(L)  Sodium 134 - 144 mmol/L 135 136 140  Potassium 3.5 - 5.2 mmol/L 4.3 4.4 4.3  Chloride 96 - 106 mmol/L 97 101 100  CO2 20 - 29 mmol/L '23 23 26  ' Calcium 8.7 - 10.2 mg/dL 10.1 9.7 9.4  Total Protein 6.0 - 8.5 g/dL 8.1 7.9 7.6  Total Bilirubin 0.0 - 1.2 mg/dL 0.5 0.5 0.4  Alkaline Phos 39 - 117 IU/L 117 88 86  AST 0 - 40 IU/L 29 41(H) 21  ALT 0 - 44 IU/L 55(H) 59(H) 24    Lipid Panel     Component Value Date/Time   CHOL 204 (H) 04/29/2019 0950   TRIG 280  (H) 04/29/2019 0950   HDL 29 (L) 04/29/2019 0950   CHOLHDL 7.0 (H) 04/29/2019 0950   CHOLHDL 6.5 07/25/2012 1047   VLDL 16 07/25/2012 1047   LDLCALC 125 (H) 04/29/2019 0950    CBC    Component Value Date/Time   WBC 11.2 (H) 03/14/2016 0756   RBC 3.98 (L) 03/14/2016 0756   HGB 12.6 (L) 03/14/2016 0756   HCT 38.2 (L) 03/14/2016 0756  PLT 212 03/14/2016 0756   MCV 96.0 03/14/2016 0756   MCH 31.7 03/14/2016 0756   MCHC 33.0 03/14/2016 0756   RDW 12.0 03/14/2016 0756    Lab Results  Component Value Date   HGBA1C 8.7 (A) 04/06/2020    Assessment & Plan:  1. Type 2 diabetes mellitus with hyperglycemia, without long-term current use of insulin (HCC) Uncontrolled with A1c of 8.7 but this has improved compared to previous labs; goal is less than 7.0 Farxiga added to regimen Counseled on Diabetic diet, my plate method, 578 minutes of moderate intensity exercise/week Blood sugar logs with fasting goals of 80-120 mg/dl, random of less than 180 and in the event of sugars less than 60 mg/dl or greater than 400 mg/dl encouraged to notify the clinic. Advised on the need for annual eye exams, annual foot exams, Pneumonia vaccine. - POCT glucose (manual entry) - POCT glycosylated hemoglobin (Hb A1C) - dapagliflozin propanediol (FARXIGA) 10 MG TABS tablet; Take 1 tablet (10 mg total) by mouth daily before breakfast.  Dispense: 30 tablet; Refill: 6 - glipiZIDE (GLUCOTROL) 10 MG tablet; Take 1 tablet (10 mg total) by mouth 2 (two) times daily before a meal.  Dispense: 180 tablet; Refill: 1 - insulin glargine (LANTUS SOLOSTAR) 100 UNIT/ML Solostar Pen; Inject 10 Units into the skin daily.  Dispense: 30 mL; Refill: 6 - liraglutide (VICTOZA) 18 MG/3ML SOPN; Inject 1.8 mg into the skin daily with breakfast.  Dispense: 30 mL; Refill: 6 - CMP14+EGFR - Lipid panel - Microalbumin / creatinine urine ratio  2. Candidal balanitis - clotrimazole (LOTRIMIN) 1 % cream; Apply 1 application topically 2 (two)  times daily.  Dispense: 60 g; Refill: 1  3. Essential hypertension Slightly above goal No regimen change today but will consider increasing his dose if still above goal at next visit Counseled on blood pressure goal of less than 130/80, low-sodium, DASH diet, medication compliance, 150 minutes of moderate intensity exercise per week. Discussed medication compliance, adverse effects. - lisinopril-hydrochlorothiazide (ZESTORETIC) 10-12.5 MG tablet; Take 1 tablet by mouth daily.  Dispense: 90 tablet; Refill: 1  4. Mixed hyperlipidemia Uncontrolled We will order lipid panel and adjust regimen accordingly Low-cholesterol diet  5. Morbid obesity with BMI of 40.0-44.9, adult ([place]) Commended on 7 pound weight loss Counseled on continuing to work on exercising, caloric restriction and avoiding late meals   No orders of the defined types were placed in this encounter.   Return in about 3 months (around 07/07/2020) for chronic disease management.         Charlott Rakes, MD, FAAFP. [facility] [address] [facility], [place]   04/06/2020, 1:58 PM

## 2020-04-06 NOTE — Patient Instructions (Signed)

## 2020-04-07 ENCOUNTER — Other Ambulatory Visit: Payer: Self-pay | Admitting: Family Medicine

## 2020-04-07 LAB — CMP14+EGFR
ALT: 56 IU/L — ABNORMAL HIGH (ref 0–44)
AST: 50 IU/L — ABNORMAL HIGH (ref 0–40)
Albumin/Globulin Ratio: 1.2 (ref 1.2–2.2)
Albumin: 4.4 g/dL (ref 3.8–4.9)
Alkaline Phosphatase: 93 IU/L (ref 44–121)
BUN/Creatinine Ratio: 11 (ref 9–20)
BUN: 8 mg/dL (ref 6–24)
Bilirubin Total: 0.4 mg/dL (ref 0.0–1.2)
CO2: 24 mmol/L (ref 20–29)
Calcium: 9.7 mg/dL (ref 8.7–10.2)
Chloride: 99 mmol/L (ref 96–106)
Creatinine, Ser: 0.72 mg/dL — ABNORMAL LOW (ref 0.76–1.27)
Globulin, Total: 3.7 g/dL (ref 1.5–4.5)
Glucose: 170 mg/dL — ABNORMAL HIGH (ref 65–99)
Potassium: 4.3 mmol/L (ref 3.5–5.2)
Sodium: 138 mmol/L (ref 134–144)
Total Protein: 8.1 g/dL (ref 6.0–8.5)
eGFR: 105 mL/min/{1.73_m2} (ref >59)

## 2020-04-07 LAB — LIPID PANEL
Chol/HDL Ratio: 6.3 ratio — ABNORMAL HIGH (ref 0.0–5.0)
Cholesterol, Total: 196 mg/dL (ref 100–199)
HDL: 31 mg/dL — ABNORMAL LOW (ref >39)
LDL Chol Calc (NIH): 147 mg/dL — ABNORMAL HIGH (ref 0–99)
Triglycerides: 98 mg/dL (ref 0–149)
VLDL Cholesterol Cal: 18 mg/dL (ref 5–40)

## 2020-04-07 LAB — MICROALBUMIN / CREATININE URINE RATIO
Creatinine, Urine: 111.4 mg/dL
Microalb/Creat Ratio: 10 mg/g creat (ref 0–29)
Microalbumin, Urine: 11.2 ug/mL

## 2020-04-07 MED ORDER — ROSUVASTATIN CALCIUM 20 MG PO TABS: 20.0000 mg | ORAL_TABLET | Freq: Every day | ORAL | 1 refills | Status: DC

## 2020-04-07 MED FILL — ROSUVASTATIN CALCIUM 20 MG: 20 | 90 days supply | Qty: 90 | Fill #0

## 2020-04-08 ENCOUNTER — Telehealth: Payer: Self-pay

## 2020-04-08 NOTE — Telephone Encounter (Signed)
-----   Message from Hoy Register, MD sent at 04/07/2020  9:01 AM EDT ----- Cholesterol is elevated.  I have switched his Lipitor to Crestor and a new prescription sent to the pharmacy.  Please encourage to work on a low-cholesterol diet and exercise.

## 2020-04-08 NOTE — Telephone Encounter (Signed)
Patient name and DOB has been verified Patient was informed of lab results. Patient had no questions.  

## 2020-04-13 ENCOUNTER — Other Ambulatory Visit: Payer: Self-pay | Admitting: Family Medicine

## 2020-04-13 NOTE — Telephone Encounter (Signed)
Per chart note on 04/06/20 Patient is no longer taking Metformin and is not on his profile. Refusing for that reason.

## 2020-04-24 ENCOUNTER — Other Ambulatory Visit: Payer: Self-pay | Admitting: Family Medicine

## 2020-04-24 DIAGNOSIS — I10 Essential (primary) hypertension: Secondary | ICD-10-CM

## 2020-04-24 NOTE — Telephone Encounter (Signed)
Requested Prescriptions  Pending Prescriptions Disp Refills  . pravastatin (PRAVACHOL) 20 MG tablet [Pharmacy Med Name: Pravastatin Sodium 20mg  Tablet] 30 tablet     Sig: Take 1 tablet by mouth every day     Cardiovascular:  Antilipid - Statins Failed - 04/24/2020  2:05 AM      Failed - LDL in normal range and within 360 days    LDL Chol Calc (NIH)  Date Value Ref Range Status  04/06/2020 147 (H) 0 - 99 mg/dL Final         Failed - HDL in normal range and within 360 days    HDL  Date Value Ref Range Status  04/06/2020 31 (L) >39 mg/dL Final         Passed - Total Cholesterol in normal range and within 360 days    Cholesterol, Total  Date Value Ref Range Status  04/06/2020 196 100 - 199 mg/dL Final         Passed - Triglycerides in normal range and within 360 days    Triglycerides  Date Value Ref Range Status  04/06/2020 98 0 - 149 mg/dL Final         Passed - Patient is not pregnant      Passed - Valid encounter within last 12 months    Recent Outpatient Visits          2 weeks ago Type 2 diabetes mellitus with hyperglycemia, without long-term current use of insulin (HCC)   [facility] [place], [place], MD   5 months ago Left hip pain   [facility] [place], [place], MD   7 months ago Type 2 diabetes mellitus with hyperglycemia, without long-term current use of insulin (HCC)   [facility] [place], [place], MD   11 months ago Type 2 diabetes mellitus with hyperglycemia, without long-term current use of insulin (HCC)   [facility] [place], [place], MD   12 months ago Onychomycosis   [facility] [place], [place], MD             . lisinopril-hydrochlorothiazide (ZESTORETIC) 10-12.5 MG tablet [Pharmacy Med Name: Lisinopril/Hctz 10-12.5mg  Tablet] 90 tablet 1    Sig: Take 1 tablet by mouth every day     Cardiovascular:  ACEI +  Diuretic Combos Failed - 04/24/2020  2:05 AM      Failed - Cr in normal range and within 180 days    Creat  Date Value Ref Range Status  07/25/2012 0.82 0.50 - 1.35 mg/dL Final   Creatinine, Ser  Date Value Ref Range Status  04/06/2020 0.72 (L) 0.76 - 1.27 mg/dL Final         Failed - Last BP in normal range    BP Readings from Last 1 Encounters:  04/06/20 (!) 142/82         Passed - Na in normal range and within 180 days    Sodium  Date Value Ref Range Status  04/06/2020 138 134 - 144 mmol/L Final         Passed - K in normal range and within 180 days    Potassium  Date Value Ref Range Status  04/06/2020 4.3 3.5 - 5.2 mmol/L Final         Passed - Ca in normal range and within 180 days    Calcium  Date Value Ref Range Status  04/06/2020 9.7 8.7 - 10.2  mg/dL Final         Passed - Patient is not pregnant      Passed - Valid encounter within last 6 months    Recent Outpatient Visits          2 weeks ago Type 2 diabetes mellitus with hyperglycemia, without long-term current use of insulin (HCC)   [facility] [place], [place], MD   5 months ago Left hip pain   [facility] [place], [place], MD   7 months ago Type 2 diabetes mellitus with hyperglycemia, without long-term current use of insulin (HCC)   [facility] [place], [place], MD   11 months ago Type 2 diabetes mellitus with hyperglycemia, without long-term current use of insulin (HCC)   [facility] Hoy Register, MD   12 months ago Onychomycosis   [facility] Hoy Register, MD

## 2020-05-09 ENCOUNTER — Other Ambulatory Visit: Payer: Self-pay

## 2020-05-11 ENCOUNTER — Telehealth: Payer: Self-pay | Admitting: *Deleted

## 2020-05-11 NOTE — Telephone Encounter (Signed)
Patient calling for the length of time of his appointment on Friday. Returned call to patient and gave information , verbalized understanding.

## 2020-05-13 ENCOUNTER — Ambulatory Visit (INDEPENDENT_AMBULATORY_CARE_PROVIDER_SITE_OTHER): Payer: Medicare Other | Admitting: Podiatry

## 2020-05-13 ENCOUNTER — Encounter: Payer: Self-pay | Admitting: Podiatry

## 2020-05-13 ENCOUNTER — Other Ambulatory Visit: Payer: Self-pay

## 2020-05-13 DIAGNOSIS — M79674 Pain in right toe(s): Secondary | ICD-10-CM

## 2020-05-13 DIAGNOSIS — E119 Type 2 diabetes mellitus without complications: Secondary | ICD-10-CM

## 2020-05-13 DIAGNOSIS — B351 Tinea unguium: Secondary | ICD-10-CM

## 2020-05-13 DIAGNOSIS — M79675 Pain in left toe(s): Secondary | ICD-10-CM

## 2020-05-13 NOTE — Progress Notes (Signed)
This patient returns to my office for at risk foot care.  This patient requires this care by a professional since this patient will be at risk due to having  Diabetes.  This patient is unable to cut nails himself since the patient cannot reach his nails.These nails are painful walking and wearing shoes.  This patient presents for at risk foot care today.  General Appearance  Alert, conversant and in no acute stress.  Vascular  Dorsalis pedis and posterior tibial  pulses are palpable  bilaterally.  Capillary return is within normal limits  bilaterally. Temperature is within normal limits  bilaterally.  Neurologic  Senn-Weinstein monofilament wire test within normal limits  bilaterally. Muscle power within normal limits bilaterally.  Nails Thick disfigured discolored nails with subungual debris  from hallux to fifth toes bilaterally. No evidence of bacterial infection or drainage bilaterally.  Orthopedic  No limitations of motion  feet .  No crepitus or effusions noted.  No bony pathology or digital deformities noted.  Skin  normotropic skin with no porokeratosis noted bilaterally.  No signs of infections or ulcers noted.     Onychomycosis  Pain in right toes  Pain in left toes  Consent was obtained for treatment procedures.   Mechanical debridement of nails 1-5  bilaterally performed with a nail nipper.  Filed with dremel without incident.    Return office visit  3 months                    Told patient to return for periodic foot care and evaluation due to potential at risk complications.   Orlena Garmon DPM  

## 2020-05-24 ENCOUNTER — Other Ambulatory Visit: Payer: Self-pay | Admitting: Family Medicine

## 2020-06-23 ENCOUNTER — Other Ambulatory Visit: Payer: Self-pay | Admitting: Family Medicine

## 2020-06-26 ENCOUNTER — Other Ambulatory Visit: Payer: Self-pay | Admitting: Family Medicine

## 2020-06-26 NOTE — Telephone Encounter (Signed)
dc'd 10/02/19 change in therapy Dr Alvis Lemmings

## 2020-07-11 ENCOUNTER — Other Ambulatory Visit: Payer: Self-pay

## 2020-07-11 ENCOUNTER — Encounter: Payer: Self-pay | Admitting: Family Medicine

## 2020-07-11 ENCOUNTER — Ambulatory Visit: Payer: Medicare Other | Attending: Family Medicine | Admitting: Family Medicine

## 2020-07-11 VITALS — BP 146/84 | HR 88 | Ht 73.0 in | Wt 310.0 lb

## 2020-07-11 DIAGNOSIS — I152 Hypertension secondary to endocrine disorders: Secondary | ICD-10-CM

## 2020-07-11 DIAGNOSIS — B3742 Candidal balanitis: Secondary | ICD-10-CM

## 2020-07-11 DIAGNOSIS — E1165 Type 2 diabetes mellitus with hyperglycemia: Secondary | ICD-10-CM | POA: Diagnosis not present

## 2020-07-11 DIAGNOSIS — E1159 Type 2 diabetes mellitus with other circulatory complications: Secondary | ICD-10-CM

## 2020-07-11 DIAGNOSIS — Z6841 Body Mass Index (BMI) 40.0 and over, adult: Secondary | ICD-10-CM

## 2020-07-11 LAB — POCT GLYCOSYLATED HEMOGLOBIN (HGB A1C): HbA1c, POC (controlled diabetic range): 9.9 % — AB (ref 0.0–7.0)

## 2020-07-11 LAB — GLUCOSE, POCT (MANUAL RESULT ENTRY): POC Glucose: 317 mg/dl — AB (ref 70–99)

## 2020-07-11 MED ORDER — ACCU-CHEK GUIDE W/DEVICE KIT
PACK | 0 refills | Status: DC
  Filled 2020-07-11: qty 1, 30d supply, fill #0

## 2020-07-11 MED ORDER — ACCU-CHEK GUIDE VI STRP
ORAL_STRIP | 12 refills | Status: DC
  Filled 2020-07-11: qty 100, 33d supply, fill #0

## 2020-07-11 MED ORDER — ACCU-CHEK SOFTCLIX LANCETS MISC
12 refills | Status: AC
  Filled 2020-07-11: qty 100, 33d supply, fill #0

## 2020-07-11 MED ORDER — CLOTRIMAZOLE 1 % EX CREA
TOPICAL_CREAM | Freq: Two times a day (BID) | CUTANEOUS | 1 refills | Status: AC
  Filled 2020-07-11: qty 60, fill #0

## 2020-07-11 MED ORDER — INSULIN GLARGINE 100 UNIT/ML SOLOSTAR PEN
15.0000 [IU] | PEN_INJECTOR | Freq: Every day | SUBCUTANEOUS | 6 refills | Status: DC
  Filled 2020-07-11: qty 6, 40d supply, fill #0

## 2020-07-11 NOTE — Patient Instructions (Signed)
Diabetes Mellitus and Nutrition, Adult When you have diabetes, or diabetes mellitus, it is very important to have healthy eating habits because your blood sugar (glucose) levels are greatly affected by what you eat and drink. Eating healthy foods in the right amounts, at about the same times every day, can help you:  Control your blood glucose.  Lower your risk of heart disease.  Improve your blood pressure.  Reach or maintain a healthy weight. What can affect my meal plan? Every person with diabetes is different, and each person has different needs for a meal plan. Your health care provider may recommend that you work with a dietitian to make a meal plan that is best for you. Your meal plan may vary depending on factors such as:  The calories you need.  The medicines you take.  Your weight.  Your blood glucose, blood pressure, and cholesterol levels.  Your activity level.  Other health conditions you have, such as heart or kidney disease. How do carbohydrates affect me? Carbohydrates, also called carbs, affect your blood glucose level more than any other type of food. Eating carbs naturally raises the amount of glucose in your blood. Carb counting is a method for keeping track of how many carbs you eat. Counting carbs is important to keep your blood glucose at a healthy level, especially if you use insulin or take certain oral diabetes medicines. It is important to know how many carbs you can safely have in each meal. This is different for every person. Your dietitian can help you calculate how many carbs you should have at each meal and for each snack. How does alcohol affect me? Alcohol can cause a sudden decrease in blood glucose (hypoglycemia), especially if you use insulin or take certain oral diabetes medicines. Hypoglycemia can be a life-threatening condition. Symptoms of hypoglycemia, such as sleepiness, dizziness, and confusion, are similar to symptoms of having too much  alcohol.  Do not drink alcohol if: ? Your health care provider tells you not to drink. ? You are pregnant, may be pregnant, or are planning to become pregnant.  If you drink alcohol: ? Do not drink on an empty stomach. ? Limit how much you use to:  0-1 drink a day for women.  0-2 drinks a day for men. ? Be aware of how much alcohol is in your drink. In the [place] one drink equals one 12 oz bottle of beer (355 mL), one 5 oz glass of wine (148 mL), or one 1 oz glass of hard liquor (44 mL). ? Keep yourself hydrated with water, diet soda, or unsweetened iced tea.  Keep in mind that regular soda, juice, and other mixers may contain a lot of sugar and must be counted as carbs. What are tips for following this plan? Reading food labels  Start by checking the serving size on the "Nutrition Facts" label of packaged foods and drinks. The amount of calories, carbs, fats, and other nutrients listed on the label is based on one serving of the item. Many items contain more than one serving per package.  Check the total grams (g) of carbs in one serving. You can calculate the number of servings of carbs in one serving by dividing the total carbs by 15. For example, if a food has 30 g of total carbs per serving, it would be equal to 2 servings of carbs.  Check the number of grams (g) of saturated fats and trans fats in one serving. Choose foods that have   a low amount or none of these fats.  Check the number of milligrams (mg) of salt (sodium) in one serving. Most people should limit total sodium intake to less than 2,300 mg per day.  Always check the nutrition information of foods labeled as "low-fat" or "nonfat." These foods may be higher in added sugar or refined carbs and should be avoided.  Talk to your dietitian to identify your daily goals for nutrients listed on the label. Shopping  Avoid buying canned, pre-made, or processed foods. These foods tend to be high in fat, sodium, and added  sugar.  Shop around the outside edge of the grocery store. This is where you will most often find fresh fruits and vegetables, bulk grains, fresh meats, and fresh dairy. Cooking  Use low-heat cooking methods, such as baking, instead of high-heat cooking methods like deep frying.  Cook using healthy oils, such as olive, canola, or sunflower oil.  Avoid cooking with butter, cream, or high-fat meats. Meal planning  Eat meals and snacks regularly, preferably at the same times every day. Avoid going long periods of time without eating.  Eat foods that are high in fiber, such as fresh fruits, vegetables, beans, and whole grains. Talk with your dietitian about how many servings of carbs you can eat at each meal.  Eat 4-6 oz (112-168 g) of lean protein each day, such as lean meat, chicken, fish, eggs, or tofu. One ounce (oz) of lean protein is equal to: ? 1 oz (28 g) of meat, chicken, or fish. ? 1 egg. ?  cup (62 g) of tofu.  Eat some foods each day that contain healthy fats, such as avocado, nuts, seeds, and fish.   What foods should I eat? Fruits Berries. Apples. Oranges. Peaches. Apricots. Plums. Grapes. Mango. Papaya. Pomegranate. Kiwi. Cherries. Vegetables Lettuce. Spinach. Leafy greens, including kale, chard, collard greens, and mustard greens. Beets. Cauliflower. Cabbage. Broccoli. Carrots. Green beans. Tomatoes. Peppers. Onions. Cucumbers. Brussels sprouts. Grains Whole grains, such as whole-wheat or whole-grain bread, crackers, tortillas, cereal, and pasta. Unsweetened oatmeal. Quinoa. Brown or wild rice. Meats and other proteins Seafood. Poultry without skin. Lean cuts of poultry and beef. Tofu. Nuts. Seeds. Dairy Low-fat or fat-free dairy products such as milk, yogurt, and cheese. The items listed above may not be a complete list of foods and beverages you can eat. Contact a dietitian for more information. What foods should I avoid? Fruits Fruits canned with  syrup. Vegetables Canned vegetables. Frozen vegetables with butter or cream sauce. Grains Refined white flour and flour products such as bread, pasta, snack foods, and cereals. Avoid all processed foods. Meats and other proteins Fatty cuts of meat. Poultry with skin. Breaded or fried meats. Processed meat. Avoid saturated fats. Dairy Full-fat yogurt, cheese, or milk. Beverages Sweetened drinks, such as soda or iced tea. The items listed above may not be a complete list of foods and beverages you should avoid. Contact a dietitian for more information. Questions to ask a health care provider  Do I need to meet with a diabetes educator?  Do I need to meet with a dietitian?  What number can I call if I have questions?  When are the best times to check my blood glucose? Where to find more information:  American Diabetes Association: diabetes.org  Academy of Nutrition and Dietetics: www.eatright.org  National Institute of Diabetes and Digestive and Kidney Diseases: www.niddk.nih.gov  Association of Diabetes Care and Education Specialists: www.diabeteseducator.org Summary  It is important to have healthy eating   habits because your blood sugar (glucose) levels are greatly affected by what you eat and drink.  A healthy meal plan will help you control your blood glucose and maintain a healthy lifestyle.  Your health care provider may recommend that you work with a dietitian to make a meal plan that is best for you.  Keep in mind that carbohydrates (carbs) and alcohol have immediate effects on your blood glucose levels. It is important to count carbs and to use alcohol carefully. This information is not intended to replace advice given to you by your health care provider. Make sure you discuss any questions you have with your health care provider. Document Revised: 12/09/2018 Document Reviewed: 12/09/2018 Elsevier Patient Education  2021 [employer].  

## 2020-07-11 NOTE — Progress Notes (Signed)
Subjective:  Patient ID: Brandon Finley, male    DOB: [birth year]  Age: 60 y.o. 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  Interval History: His A1c is 9.9 up from 8.7 previously. Blood sugars at home are not been checked as he states he has no glucometer. With regards to hypoglycemia, denies episodes of hypoglycemia, visual symptoms are absent, neuropathy is controlled. Not up-to-date on annual eye exam. Endorses exercising and goes to the gym every day.  Also working on eating a healthy diet and has been compliant with his medications. He would like referral for Diabetic shoes.  His podiatrist is [facility].  Compliant with his antihypertensive and his statin. He has had recurrent candidal balanitis for which clotrimazole had been prescribed.  This worked initially the first time but subsequently has been ineffective.  He is not circumcised.  Past Medical History:  Diagnosis Date   Arthritis    Hypertension     Past Surgical History:  Procedure Laterality Date   TOTAL HIP ARTHROPLASTY Right 03/13/2016   Procedure: RIGHT TOTAL HIP ARTHROPLASTY ANTERIOR APPROACH;  Surgeon: Mcarthur Rossetti, MD;  Location: [place];  Service: Orthopedics;  Laterality: Right;    Family History  Problem Relation Age of Onset   Hypertension Father     Allergies  Allergen Reactions   No Known Allergies     Outpatient Medications Prior to Visit  Medication Sig Dispense Refill   aspirin EC 81 MG tablet Take 1 tablet (81 mg total) by mouth daily. 90 tablet 1   Blood Glucose Monitoring Suppl (ONETOUCH VERIO) w/Device KIT Use to check blood sugar up to 3 times daily. 1 kit 0   cetirizine (ZYRTEC) 10 MG tablet Take 1 tablet (10 mg total) by mouth daily. 30 tablet 1    dapagliflozin propanediol (FARXIGA) 10 MG TABS tablet TAKE 1 TABLET (10 MG TOTAL) BY MOUTH DAILY BEFORE BREAKFAST. 30 tablet 6   glipiZIDE (GLUCOTROL) 10 MG tablet TAKE 1 TABLET (10 MG TOTAL) BY MOUTH 2 (TWO) TIMES DAILY BEFORE A MEAL. 180 tablet 1   glucose blood (ONETOUCH VERIO) test strip Use as instructed 100 each 11   Insulin Pen Needle (TRUEPLUS PEN NEEDLES) 32G X 4 MM MISC Use to inject insulin and Victoza twice daily 100 each 6   lidocaine (LIDODERM) 5 % Place 1 patch onto the skin daily. Remove & Discard patch within 12 hours or as directed by MD 30 patch 0   liraglutide (VICTOZA) 18 MG/3ML SOPN INJECT 1.8 MG INTO THE SKIN DAILY WITH BREAKFAST. 30 mL 6   lisinopril-hydrochlorothiazide (ZESTORETIC) 10-12.5 MG tablet Take 1 tablet by mouth every day 90 tablet 1   methocarbamol (ROBAXIN) 500 MG tablet Take 2 tablets (1,000 mg total) by mouth 2 (two) times daily. 120 tablet 2   nabumetone (RELAFEN) 750 MG tablet Take 1 tablet by mouth twice daily as needed 60 tablet 11   OneTouch Delica Lancets 93J MISC Use to check blood sugar up to 3 times daily. 100 each 11   PEG 3350-KCl-Na Bicarb-NaCl (GAVILYTE-N WITH FLAVOR PACK PO)      rosuvastatin (CRESTOR) 20 MG tablet TAKE 1 TABLET (20 MG TOTAL) BY MOUTH DAILY. 90 tablet 1   clotrimazole (LOTRIMIN) 1 % cream APPLY 1 APPLICATION TOPICALLY 2 (TWO) TIMES  DAILY. 60 g 1   insulin glargine (LANTUS) 100 UNIT/ML Solostar Pen INJECT 10 UNITS INTO THE SKIN DAILY. 30 mL 6   No facility-administered medications prior to visit.     ROS Review of Systems  Constitutional:  Negative for activity change and appetite change.  HENT:  Negative for sinus pressure and sore throat.   Eyes:  Negative for visual disturbance.  Respiratory:  Negative for cough, chest tightness and shortness of breath.   Cardiovascular:  Negative for chest pain and leg swelling.  Gastrointestinal:  Negative for abdominal distention, abdominal pain, constipation and diarrhea.   Endocrine: Negative.   Genitourinary:  Negative for dysuria.  Musculoskeletal:  Negative for joint swelling and myalgias.  Skin:  Negative for rash.  Allergic/Immunologic: Negative.   Neurological:  Negative for weakness, light-headedness and numbness.  Psychiatric/Behavioral:  Negative for dysphoric mood and suicidal ideas.    Objective:  BP (!) 146/84   Pulse 88   Ht '6\' 1"'  (1.854 m)   Wt (!) 310 lb (140.6 kg)   SpO2 96%   BMI 40.90 kg/m   BP/Weight 07/11/2020 04/06/2020 30/09/2328  Systolic BP 076 226 -  Diastolic BP 84 82 -  Wt. (Lbs) 310 310.2 317  BMI 40.9 40.93 41.82      Physical Exam Constitutional:      Appearance: He is well-developed. He is obese.  Neck:     Vascular: No JVD.  Cardiovascular:     Rate and Rhythm: Normal rate.     Heart sounds: Normal heart sounds. No murmur heard. Pulmonary:     Effort: Pulmonary effort is normal.     Breath sounds: Normal breath sounds. No wheezing or rales.  Chest:     Chest wall: No tenderness.  Abdominal:     General: Bowel sounds are normal. There is no distension.     Palpations: Abdomen is soft. There is no mass.     Tenderness: There is no abdominal tenderness.  Musculoskeletal:        General: Normal range of motion.     Right lower leg: No edema.     Left lower leg: No edema.  Neurological:     Mental Status: He is alert and oriented to person, place, and time.  Psychiatric:        Mood and Affect: Mood normal.    CMP Latest Ref Rng & Units 04/06/2020 04/29/2019 07/10/2018  Glucose 65 - 99 mg/dL 170(H) 348(H) 258(H)  BUN 6 - 24 mg/dL '8 6 12  ' Creatinine 0.76 - 1.27 mg/dL 0.72(L) 0.68(L) 0.90  Sodium 134 - 144 mmol/L 138 135 136  Potassium 3.5 - 5.2 mmol/L 4.3 4.3 4.4  Chloride 96 - 106 mmol/L 99 97 101  CO2 20 - 29 mmol/L '24 23 23  ' Calcium 8.7 - 10.2 mg/dL 9.7 10.1 9.7  Total Protein 6.0 - 8.5 g/dL 8.1 8.1 7.9  Total Bilirubin 0.0 - 1.2 mg/dL 0.4 0.5 0.5  Alkaline Phos 44 - 121 IU/L 93 117 88  AST 0 -  40 IU/L 50(H) 29 41(H)  ALT 0 - 44 IU/L 56(H) 55(H) 59(H)    Lipid Panel     Component Value Date/Time   CHOL 196 04/06/2020 1422   TRIG 98 04/06/2020 1422   HDL 31 (L) 04/06/2020 1422   CHOLHDL 6.3 (H) 04/06/2020 1422   CHOLHDL 6.5 07/25/2012 1047   VLDL 16 07/25/2012 1047   LDLCALC 147 (H) 04/06/2020 1422    CBC    Component  Value Date/Time   WBC 11.2 (H) 03/14/2016 0756   RBC 3.98 (L) 03/14/2016 0756   HGB 12.6 (L) 03/14/2016 0756   HCT 38.2 (L) 03/14/2016 0756   PLT 212 03/14/2016 0756   MCV 96.0 03/14/2016 0756   MCH 31.7 03/14/2016 0756   MCHC 33.0 03/14/2016 0756   RDW 12.0 03/14/2016 0756    Lab Results  Component Value Date   HGBA1C 9.9 (A) 07/11/2020    Assessment & Plan:  1. Type 2 diabetes mellitus with hyperglycemia, without long-term current use of insulin (HCC) Uncontrolled with A1c of 9.9 Increased Lantus from 9 units to 15 units nightly He will follow-up with the clinical pharmacist to titrate up his Lantus regimen Prescription for glucometer has been written He has been commended on his lifestyle modifications and advised to continue working on them Counseled on Diabetic diet, my plate method, 546 minutes of moderate intensity exercise/week Blood sugar logs with fasting goals of 80-120 mg/dl, random of less than 180 and in the event of sugars less than 60 mg/dl or greater than 400 mg/dl encouraged to notify the clinic. Advised on the need for annual eye exams, annual foot exams, Pneumonia vaccine. - POCT glucose (manual entry) - POCT glycosylated hemoglobin (Hb A1C) - insulin glargine (LANTUS) 100 UNIT/ML Solostar Pen; Inject 15 Units into the skin daily.  Dispense: 30 mL; Refill: 6 - Blood Glucose Monitoring Suppl (ACCU-CHEK GUIDE) w/Device KIT; use to Test blood sugar  3 times daily  Dispense: 1 kit; Refill: 0 - glucose blood (ACCU-CHEK GUIDE) test strip; Use as instructed  Dispense: 100 each; Refill: 12 - Accu-Chek Softclix Lancets lancets; Use  as instructed  Dispense: 100 each; Refill: 12  2. Candidal balanitis Uncontrolled, recurrent - clotrimazole (LOTRIMIN) 1 % cream; APPLY 1 APPLICATION TOPICALLY 2 (TWO) TIMES DAILY.  Dispense: 60 g; Refill: 1 - Ambulatory referral to Urology  3. Hypertension associated with diabetes ([place]) Slightly above goal No regimen change.  Advised to continue to work on lifestyle modification Counseled on blood pressure goal of less than 130/80, low-sodium, DASH diet, medication compliance, 150 minutes of moderate intensity exercise per week. Discussed medication compliance, adverse effects.   4. Morbid obesity with BMI of 40.0-44.9, adult ([place]) Counseled on reducing portion sizes, avoiding late meals, caloric restriction   Meds ordered this encounter  Medications   insulin glargine (LANTUS) 100 UNIT/ML Solostar Pen    Sig: Inject 15 Units into the skin daily.    Dispense:  30 mL    Refill:  6    Dose change   clotrimazole (LOTRIMIN) 1 % cream    Sig: APPLY 1 APPLICATION TOPICALLY 2 (TWO) TIMES DAILY.    Dispense:  60 g    Refill:  1   Blood Glucose Monitoring Suppl (ACCU-CHEK GUIDE) w/Device KIT    Sig: use to Test blood sugar  3 times daily    Dispense:  1 kit    Refill:  0   glucose blood (ACCU-CHEK GUIDE) test strip    Sig: Use as instructed    Dispense:  100 each    Refill:  12   Accu-Chek Softclix Lancets lancets    Sig: Use as instructed    Dispense:  100 each    Refill:  12    Follow-up: Return in about 1 week (around 07/18/2020) for [facility] for blood sugar evaluation; PCP 3 months.       Charlott Rakes, MD, FAAFP. [facility] and [facility] [place], [place]  07/11/2020, 5:43 PM

## 2020-07-22 ENCOUNTER — Other Ambulatory Visit: Payer: Self-pay

## 2020-08-11 DIAGNOSIS — Z20822 Contact with and (suspected) exposure to covid-19: Secondary | ICD-10-CM | POA: Diagnosis not present

## 2020-08-12 ENCOUNTER — Ambulatory Visit: Payer: Medicare Other | Admitting: Podiatry

## 2020-08-24 ENCOUNTER — Ambulatory Visit (INDEPENDENT_AMBULATORY_CARE_PROVIDER_SITE_OTHER): Payer: Medicare Other | Admitting: Podiatry

## 2020-08-24 ENCOUNTER — Encounter: Payer: Self-pay | Admitting: Podiatry

## 2020-08-24 ENCOUNTER — Other Ambulatory Visit: Payer: Self-pay

## 2020-08-24 DIAGNOSIS — E119 Type 2 diabetes mellitus without complications: Secondary | ICD-10-CM

## 2020-08-24 DIAGNOSIS — B351 Tinea unguium: Secondary | ICD-10-CM

## 2020-08-24 DIAGNOSIS — M79675 Pain in left toe(s): Secondary | ICD-10-CM

## 2020-08-24 DIAGNOSIS — M79674 Pain in right toe(s): Secondary | ICD-10-CM | POA: Diagnosis not present

## 2020-08-24 NOTE — Progress Notes (Signed)
This patient returns to my office for at risk foot care.  This patient requires this care by a professional since this patient will be at risk due to having  Diabetes.   This patient is unable to cut nails himself since the patient cannot reach his nails.These nails are painful walking and wearing shoes.  This patient presents for at risk foot care today.   General Appearance  Alert, conversant and in no acute stress.  Vascular  Dorsalis pedis and posterior tibial  pulses are palpable  bilaterally.  Capillary return is within normal limits  bilaterally. Temperature is within normal limits  bilaterally.  Neurologic  Senn-Weinstein monofilament wire test diminished bilaterally. Muscle power within normal limits bilaterally.  Nails Thick disfigured discolored nails with subungual debris  from hallux to fifth toes bilaterally. No evidence of bacterial infection or drainage bilaterally.  Orthopedic  No limitations of motion  feet .  No crepitus or effusions noted.  No bony pathology or digital deformities noted.  HAV  B/L.  Skin  normotropic skin with no porokeratosis noted bilaterally.  No signs of infections or ulcers noted.     Onychomycosis  Pain in right toes  Pain in left toes  Consent was obtained for treatment procedures.   Mechanical debridement of nails 1-5  bilaterally performed with a nail nipper.  Filed with dremel without incident.  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   Return office visit  3 months                    Told patient to return for periodic foot care and evaluation due to potential at risk complications.   Raysha Tilmon DPM  

## 2020-09-05 DIAGNOSIS — Z20822 Contact with and (suspected) exposure to covid-19: Secondary | ICD-10-CM | POA: Diagnosis not present

## 2020-09-12 ENCOUNTER — Other Ambulatory Visit: Payer: Medicare Other

## 2020-09-20 ENCOUNTER — Other Ambulatory Visit: Payer: Self-pay | Admitting: Orthopaedic Surgery

## 2020-09-27 ENCOUNTER — Ambulatory Visit (INDEPENDENT_AMBULATORY_CARE_PROVIDER_SITE_OTHER): Payer: Medicare Other | Admitting: *Deleted

## 2020-09-27 ENCOUNTER — Other Ambulatory Visit: Payer: Self-pay

## 2020-09-27 DIAGNOSIS — E119 Type 2 diabetes mellitus without complications: Secondary | ICD-10-CM

## 2020-09-27 NOTE — Progress Notes (Signed)
Patient presents to the office today for diabetic shoe and insole measuring.  Patient was measured with brannock device to determine size and width for 1 pair of extra depth shoes and foam casted for 3 pair of insoles.   Documentation of medical necessity will be sent to patient's treating diabetic doctor to verify and sign.   Patient's diabetic provider: Dr. Hoy Register  Shoes and insoles will be ordered at that time and patient will be notified for an appointment for fitting when they arrive.   Shoe size (per patient): 11-12   Brannock measurement: RIGHT - 12 E, LEFT - 12.5 D  Patient shoe selection-   1st choice:   Apex X801  2nd choice:  Orthofeet 674  Shoe size ordered: Men's 12.5 Wide

## 2020-09-28 ENCOUNTER — Telehealth: Payer: Self-pay | Admitting: Family Medicine

## 2020-09-28 NOTE — Telephone Encounter (Signed)
Copied from CRM [id number]. Topic: General - Other >> Sep 27, 2020 12:09 PM Jaquita Rector A wrote: Reason for CRM: Patient called in to inform Dr Alvis Lemmings that [facility] will send over paper work to be completed. Ph# [phone]

## 2020-09-29 NOTE — Telephone Encounter (Signed)
Noted  

## 2020-10-17 ENCOUNTER — Ambulatory Visit: Payer: Medicare Other

## 2020-11-02 ENCOUNTER — Telehealth: Payer: Self-pay | Admitting: Podiatry

## 2020-11-02 NOTE — Telephone Encounter (Signed)
Pt left 2 messages checking to see if his diabetic shoes were back yet.  Upon checking we have not received the documents needed from pcp and it has been faxed multiple times. I called pt and told him that is what is holding up his diabetic shoes/inserts and that he may want to call the pcp office. I did give him my direct fax number to fax me the documents.

## 2020-11-03 ENCOUNTER — Telehealth: Payer: Self-pay | Admitting: Family Medicine

## 2020-11-03 NOTE — Telephone Encounter (Signed)
Paperwork has been received and patient will be called once paperwork is faxed over.

## 2020-11-03 NOTE — Telephone Encounter (Signed)
Copied from CRM [id number]. Topic: General - Inquiry >> Nov 03, 2020 10:17 AM Daphine Deutscher D wrote: Reason for CRM: Pt called to give the fax number to his podiatrist to get his diabetic shoed ordered.  They need orders for them .  FAX  [phone]

## 2020-11-07 ENCOUNTER — Ambulatory Visit: Payer: Medicare Other

## 2020-11-29 ENCOUNTER — Ambulatory Visit: Payer: Medicare Other | Admitting: Podiatry

## 2020-12-01 ENCOUNTER — Telehealth: Payer: Self-pay | Admitting: Podiatry

## 2020-12-01 NOTE — Telephone Encounter (Signed)
Pt left message asking about diabetic shoe status.  I returned call and left message that they habve just came in and to call and I can schedule an appt next week to pick them up.

## 2020-12-05 ENCOUNTER — Ambulatory Visit: Payer: Medicare Other

## 2020-12-06 ENCOUNTER — Ambulatory Visit: Payer: Medicare Other

## 2020-12-07 ENCOUNTER — Telehealth: Payer: Self-pay | Admitting: Podiatry

## 2020-12-07 NOTE — Telephone Encounter (Signed)
Pt left message yesterday afternoon asking for me to call him back and wanted to know if his brother could pick up the diabetic shoe.  I returned call and left message that his brother is not able due to we have to make sure the shoes fit and that is a medicare policy that we have to see pt to dispense the shoes.  But pt has an appt on 12.19 with Dr Stacie Acres and if he wanted he could just pick them up at that time.

## 2020-12-12 ENCOUNTER — Other Ambulatory Visit: Payer: Self-pay

## 2020-12-12 ENCOUNTER — Ambulatory Visit: Payer: Medicare Other

## 2020-12-12 ENCOUNTER — Telehealth: Payer: Self-pay | Admitting: Podiatry

## 2020-12-12 DIAGNOSIS — M2011 Hallux valgus (acquired), right foot: Secondary | ICD-10-CM | POA: Diagnosis not present

## 2020-12-12 DIAGNOSIS — E119 Type 2 diabetes mellitus without complications: Secondary | ICD-10-CM | POA: Diagnosis not present

## 2020-12-12 DIAGNOSIS — M2012 Hallux valgus (acquired), left foot: Secondary | ICD-10-CM | POA: Diagnosis not present

## 2020-12-12 NOTE — Telephone Encounter (Signed)
Pt left message asking for an appt to pick up his diabetic shoes and he was hoping to come in today.  I returned call and left message for pt to call me back to see what we could do to get him in today if possible.

## 2020-12-12 NOTE — Progress Notes (Signed)
SITUATION Reason for Visit: Fitting of Diabetic Shoes & Insoles Patient / Caregiver Report:  Patient reports comfort and is satisfied  OBJECTIVE DATA: Patient History / Diagnosis:  Diabetes Mellitus Change in Status:   None  ACTIONS PERFORMED: In-Person Delivery, patient was fit with: - 1x pair A5500 PDAC approved prefabricated Diabetic Shoes: Apex X801M 12.5W - 3x pair [id number] PDAC approved CAM milled custom diabetic insoles  Shoes and insoles were verified for structural integrity and safety. Patient wore shoes and insoles in office. Skin was inspected and free of areas of concern after wearing shoes and inserts. Shoes and inserts fit properly. Patient / Caregiver provided with ferbal instruction and demonstration regarding donning, doffing, wear, care, proper fit, function, purpose, cleaning, and use of shoes and insoles ' and in all related precautions and risks and benefits regarding shoes and insoles. Patient / Caregiver was instructed to wear properly fitting socks with shoes at all times. Patient was also provided with verbal instruction regarding how to report any failures or malfunctions of shoes or inserts, and necessary follow up care. Patient / Caregiver was also instructed to contact physician regarding change in status that may affect function of shoes and inserts.   Patient / Caregiver verbalized undersatnding of instruction provided. Patient / Caregiver demonstrated independence with proper donning and doffing of shoes and inserts.  PLAN Patient to follow up as needed. Plan of care was discussed with and agreed upon by patient and/or caregiver. All questions were answered and concerns addressed.

## 2021-01-02 ENCOUNTER — Other Ambulatory Visit: Payer: Self-pay

## 2021-01-02 ENCOUNTER — Encounter: Payer: Self-pay | Admitting: Podiatry

## 2021-01-02 ENCOUNTER — Ambulatory Visit (INDEPENDENT_AMBULATORY_CARE_PROVIDER_SITE_OTHER): Payer: Medicare Other | Admitting: Podiatry

## 2021-01-02 DIAGNOSIS — B351 Tinea unguium: Secondary | ICD-10-CM

## 2021-01-02 DIAGNOSIS — E119 Type 2 diabetes mellitus without complications: Secondary | ICD-10-CM | POA: Diagnosis not present

## 2021-01-02 DIAGNOSIS — M79675 Pain in left toe(s): Secondary | ICD-10-CM

## 2021-01-02 DIAGNOSIS — M79674 Pain in right toe(s): Secondary | ICD-10-CM

## 2021-01-02 NOTE — Progress Notes (Signed)
This patient returns to my office for at risk foot care.  This patient requires this care by a professional since this patient will be at risk due to having  Diabetes.   This patient is unable to cut nails himself since the patient cannot reach his nails.These nails are painful walking and wearing shoes.  This patient presents for at risk foot care today.   General Appearance  Alert, conversant and in no acute stress.  Vascular  Dorsalis pedis and posterior tibial  pulses are palpable  bilaterally.  Capillary return is within normal limits  bilaterally. Temperature is within normal limits  bilaterally.  Neurologic  Senn-Weinstein monofilament wire test diminished bilaterally. Muscle power within normal limits bilaterally.  Nails Thick disfigured discolored nails with subungual debris  from hallux to fifth toes bilaterally. No evidence of bacterial infection or drainage bilaterally.  Orthopedic  No limitations of motion  feet .  No crepitus or effusions noted.  No bony pathology or digital deformities noted.  HAV  B/L.  Skin  normotropic skin with no porokeratosis noted bilaterally.  No signs of infections or ulcers noted.     Onychomycosis  Pain in right toes  Pain in left toes  Consent was obtained for treatment procedures.   Mechanical debridement of nails 1-5  bilaterally performed with a nail nipper.  Filed with dremel without incident.     Return office visit  3 months                    Told patient to return for periodic foot care and evaluation due to potential at risk complications.   Jakelyn Squyres DPM  

## 2021-02-23 ENCOUNTER — Other Ambulatory Visit: Payer: Self-pay | Admitting: Family Medicine

## 2021-02-23 ENCOUNTER — Telehealth: Payer: Self-pay | Admitting: *Deleted

## 2021-02-23 DIAGNOSIS — I10 Essential (primary) hypertension: Secondary | ICD-10-CM

## 2021-02-23 NOTE — Telephone Encounter (Signed)
Called patient to schedule apt for refills.  Last OV 06/2020. Needs an appt prior to 6 mos.  A1C >7.   Patient schedule for an apt on March 22, 2021 for f/u DM. Verbalized understanding.

## 2021-02-23 NOTE — Telephone Encounter (Signed)
Requested medication (s) are due for refill today:   Yes  Requested medication (s) are on the active medication list:   Yes  Future visit scheduled:   No    Attempted to call to make him an appt but the line keeps disconnecting when he answers.   Last ordered: 04/24/2020 #90, 1 refill  Returned because protocol criteria not met.   Labs due and needs an OV.      Requested Prescriptions  Pending Prescriptions Disp Refills   lisinopril-hydrochlorothiazide (ZESTORETIC) 10-12.5 MG tablet [Pharmacy Med Name: Lisinopril/Hctz 10-12.82m Tablet] 30 tablet 11    Sig: Take 1 tablet by mouth every day     Cardiovascular:  ACEI + Diuretic Combos Failed - 02/23/2021  1:03 AM      Failed - Na in normal range and within 180 days    Sodium  Date Value Ref Range Status  04/06/2020 138 134 - 144 mmol/L Final          Failed - K in normal range and within 180 days    Potassium  Date Value Ref Range Status  04/06/2020 4.3 3.5 - 5.2 mmol/L Final          Failed - Cr in normal range and within 180 days    Creat  Date Value Ref Range Status  07/25/2012 0.82 0.50 - 1.35 mg/dL Final   Creatinine, Ser  Date Value Ref Range Status  04/06/2020 0.72 (L) 0.76 - 1.27 mg/dL Final          Failed - eGFR is 30 or above and within 180 days    GFR, Est African American  Date Value Ref Range Status  07/25/2012 >89 mL/min Final   GFR calc Af Amer  Date Value Ref Range Status  04/29/2019 122 >59 mL/min/1.73 Final   GFR, Est Non African American  Date Value Ref Range Status  07/25/2012 >89 mL/min Final    Comment:      The estimated GFR is a calculation valid for adults (>=142years old) that uses the CKD-EPI algorithm to adjust for age and sex. It is   not to be used for children, pregnant women, hospitalized patients,    patients on dialysis, or with rapidly changing kidney function. According to the NKDEP, eGFR >89 is normal, 60-89 shows mild impairment, 30-59 shows moderate impairment, 15-29  shows severe impairment and <15 is ESRD.     GFR calc non Af Amer  Date Value Ref Range Status  04/29/2019 105 >59 mL/min/1.73 Final   eGFR  Date Value Ref Range Status  04/06/2020 105 >59 mL/min/1.73 Final          Failed - Last BP in normal range    BP Readings from Last 1 Encounters:  07/11/20 (!) 146/84          Failed - Valid encounter within last 6 months    Recent Outpatient Visits           7 months ago Type 2 diabetes mellitus with hyperglycemia, without long-term current use of insulin ([place]   [place]   10 months ago Type 2 diabetes mellitus with hyperglycemia, without long-term current use of insulin ([address]   [place]   1 year ago Left hip pain   [place]   1 year ago Type 2 diabetes mellitus with hyperglycemia, without long-term current use of  insulin [facility])   [place], [place], MD   1 year ago Type 2 diabetes mellitus with hyperglycemia, without long-term current use of insulin [facility])   [place], MD              Passed - Patient is not pregnant

## 2021-02-23 NOTE — Telephone Encounter (Signed)
Pt needs an appt for his 6 month check/refills.   Attempted to call twice but the line keeps disconnecting when he answers.

## 2021-03-22 ENCOUNTER — Ambulatory Visit: Payer: Medicare Other | Admitting: Family Medicine

## 2021-04-03 DIAGNOSIS — H25813 Combined forms of age-related cataract, bilateral: Secondary | ICD-10-CM | POA: Diagnosis not present

## 2021-04-03 DIAGNOSIS — H52223 Regular astigmatism, bilateral: Secondary | ICD-10-CM | POA: Diagnosis not present

## 2021-04-03 DIAGNOSIS — H524 Presbyopia: Secondary | ICD-10-CM | POA: Diagnosis not present

## 2021-04-04 ENCOUNTER — Telehealth: Payer: Self-pay | Admitting: Family Medicine

## 2021-04-04 ENCOUNTER — Other Ambulatory Visit: Payer: Self-pay

## 2021-04-04 ENCOUNTER — Ambulatory Visit (INDEPENDENT_AMBULATORY_CARE_PROVIDER_SITE_OTHER): Payer: Medicare Other | Admitting: Podiatry

## 2021-04-04 ENCOUNTER — Encounter: Payer: Self-pay | Admitting: Podiatry

## 2021-04-04 DIAGNOSIS — E119 Type 2 diabetes mellitus without complications: Secondary | ICD-10-CM | POA: Diagnosis not present

## 2021-04-04 DIAGNOSIS — B351 Tinea unguium: Secondary | ICD-10-CM | POA: Diagnosis not present

## 2021-04-04 DIAGNOSIS — M79674 Pain in right toe(s): Secondary | ICD-10-CM | POA: Diagnosis not present

## 2021-04-04 DIAGNOSIS — K0889 Other specified disorders of teeth and supporting structures: Secondary | ICD-10-CM

## 2021-04-04 DIAGNOSIS — M79675 Pain in left toe(s): Secondary | ICD-10-CM | POA: Diagnosis not present

## 2021-04-04 NOTE — Telephone Encounter (Signed)
Called pt made aware of referral.  ?

## 2021-04-04 NOTE — Telephone Encounter (Signed)
Done

## 2021-04-04 NOTE — Progress Notes (Signed)
This patient returns to my office for at risk foot care.  This patient requires this care by a professional since this patient will be at risk due to having  Diabetes.   This patient is unable to cut nails himself since the patient cannot reach his nails.These nails are painful walking and wearing shoes.  This patient presents for at risk foot care today.   General Appearance  Alert, conversant and in no acute stress.  Vascular  Dorsalis pedis and posterior tibial  pulses are palpable  bilaterally.  Capillary return is within normal limits  bilaterally. Temperature is within normal limits  bilaterally.  Neurologic  Senn-Weinstein monofilament wire test diminished bilaterally. Muscle power within normal limits bilaterally.  Nails Thick disfigured discolored nails with subungual debris  from hallux to fifth toes bilaterally. No evidence of bacterial infection or drainage bilaterally.  Orthopedic  No limitations of motion  feet .  No crepitus or effusions noted.  No bony pathology or digital deformities noted.  HAV  B/L.  Skin  normotropic skin with no porokeratosis noted bilaterally.  No signs of infections or ulcers noted.     Onychomycosis  Pain in right toes  Pain in left toes  Consent was obtained for treatment procedures.   Mechanical debridement of nails 1-5  bilaterally performed with a nail nipper.  Filed with dremel without incident.     Return office visit  3 months                    Told patient to return for periodic foot care and evaluation due to potential at risk complications.   Almina Schul DPM  

## 2021-04-04 NOTE — Telephone Encounter (Signed)
Copied from CRM [id number]. Topic: Referral - Request for Referral ?>> Apr 04, 2021  3:14 PM Jaquita Rector A wrote: ?Has patient seen PCP for this complaint? Yes.   ?*If NO, is insurance requiring patient see PCP for this issue before PCP can refer them? ?Referral for which specialty: Dentist ?Preferred provider/office: None  ?Reason for referral: Need teeth cleaned and cared for ?

## 2021-05-01 ENCOUNTER — Ambulatory Visit: Payer: Medicare Other | Admitting: Family Medicine

## 2021-05-17 ENCOUNTER — Ambulatory Visit: Payer: Medicare Other | Admitting: Physician Assistant

## 2021-06-01 ENCOUNTER — Ambulatory Visit: Payer: Medicare Other | Admitting: Physician Assistant

## 2021-07-05 ENCOUNTER — Ambulatory Visit (INDEPENDENT_AMBULATORY_CARE_PROVIDER_SITE_OTHER): Payer: Medicare Other | Admitting: Podiatry

## 2021-07-05 ENCOUNTER — Encounter: Payer: Self-pay | Admitting: Podiatry

## 2021-07-05 DIAGNOSIS — M79675 Pain in left toe(s): Secondary | ICD-10-CM | POA: Diagnosis not present

## 2021-07-05 DIAGNOSIS — M2010 Hallux valgus (acquired), unspecified foot: Secondary | ICD-10-CM | POA: Diagnosis not present

## 2021-07-05 DIAGNOSIS — E119 Type 2 diabetes mellitus without complications: Secondary | ICD-10-CM | POA: Diagnosis not present

## 2021-07-05 DIAGNOSIS — B351 Tinea unguium: Secondary | ICD-10-CM

## 2021-07-05 DIAGNOSIS — M79674 Pain in right toe(s): Secondary | ICD-10-CM | POA: Diagnosis not present

## 2021-07-05 NOTE — Progress Notes (Signed)
This patient returns to my office for at risk foot care.  This patient requires this care by a professional since this patient will be at risk due to having  Diabetes.   This patient is unable to cut nails himself since the patient cannot reach his nails.These nails are painful walking and wearing shoes.  This patient presents for at risk foot care today.   General Appearance  Alert, conversant and in no acute stress.  Vascular  Dorsalis pedis and posterior tibial  pulses are palpable  bilaterally.  Capillary return is within normal limits  bilaterally. Temperature is within normal limits  bilaterally.  Neurologic  Senn-Weinstein monofilament wire test diminished bilaterally. Muscle power within normal limits bilaterally.  Nails Thick disfigured discolored nails with subungual debris  from hallux to fifth toes bilaterally. No evidence of bacterial infection or drainage bilaterally.  Orthopedic  No limitations of motion  feet .  No crepitus or effusions noted.  No bony pathology or digital deformities noted.  HAV  B/L.  Skin  normotropic skin with no porokeratosis noted bilaterally.  No signs of infections or ulcers noted.     Onychomycosis  Pain in right toes  Pain in left toes  Consent was obtained for treatment procedures.   Mechanical debridement of nails 1-5  bilaterally performed with a nail nipper.  Filed with dremel without incident.  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   Return office visit  3 months                    Told patient to return for periodic foot care and evaluation due to potential at risk complications.   Helane Gunther DPM

## 2021-07-22 ENCOUNTER — Other Ambulatory Visit: Payer: Self-pay | Admitting: Orthopaedic Surgery

## 2021-07-31 ENCOUNTER — Other Ambulatory Visit: Payer: Medicare Other

## 2021-08-01 ENCOUNTER — Other Ambulatory Visit: Payer: Medicare Other

## 2021-08-23 ENCOUNTER — Ambulatory Visit: Payer: Medicare Other | Admitting: Family Medicine

## 2021-08-23 ENCOUNTER — Other Ambulatory Visit: Payer: Medicare Other

## 2021-09-26 ENCOUNTER — Telehealth: Payer: Self-pay | Admitting: Family Medicine

## 2021-09-26 ENCOUNTER — Ambulatory Visit: Payer: Medicare Other | Admitting: Family Medicine

## 2021-09-26 NOTE — Telephone Encounter (Signed)
Pt called in stated he hit his right leg on the job. Stated he had a bad scar asked if there was bleeding said no that he was going to the hospital as he is a diabetic. Offered to speak with NT pt declined stated he is okay just needs to cancel todays appointment reschedule to next available for Dr.Newlin.   Pt mentioned only wanting to see her and that his father passed away last year and he was in a bad depression but is taking care of things now. Again, offered NT pt declined FYI to office.  Pt is scheduled for 01/01/2022.

## 2021-09-26 NOTE — Telephone Encounter (Signed)
Noted  

## 2021-09-27 ENCOUNTER — Ambulatory Visit: Payer: Medicare Other | Admitting: *Deleted

## 2021-09-27 NOTE — Progress Notes (Signed)
Patient presents to the office today for diabetic shoe and insole measuring.  Patient was measured with brannock device to determine size and width for 1 pair of extra depth shoes and foam casted for 3 pair of insoles.   Documentation of medical necessity will be sent to patient's treating diabetic doctor to verify and sign.   Patient's diabetic provider: Hoy Register  Shoes and insoles will be ordered at that time and patient will be notified for an appointment for fitting when they arrive.   Shoe size (per patient): 11-12   Brannock measurement: RIGHT - 12 D   LEFT - 12 D  Patient shoe selection-   Shoe choice:   Apex [id number]  Shoe size ordered: Men's 12 X-Wide

## 2021-10-02 ENCOUNTER — Telehealth: Payer: Self-pay

## 2021-10-02 NOTE — Telephone Encounter (Signed)
Yes he can

## 2021-10-02 NOTE — Telephone Encounter (Signed)
Can patient do a telephone visit for his diabetic shoe paperwork to be signed from [facility].

## 2021-10-04 NOTE — Telephone Encounter (Signed)
Call placed to patient and VM was left informing patient to return phone call to schedule a telephone visit.

## 2021-10-09 ENCOUNTER — Ambulatory Visit: Payer: Medicare Other | Attending: Family Medicine | Admitting: Family Medicine

## 2021-10-09 ENCOUNTER — Encounter: Payer: Self-pay | Admitting: Family Medicine

## 2021-10-09 ENCOUNTER — Other Ambulatory Visit: Payer: Self-pay

## 2021-10-09 DIAGNOSIS — E1159 Type 2 diabetes mellitus with other circulatory complications: Secondary | ICD-10-CM | POA: Diagnosis not present

## 2021-10-09 DIAGNOSIS — N472 Paraphimosis: Secondary | ICD-10-CM

## 2021-10-09 DIAGNOSIS — E1169 Type 2 diabetes mellitus with other specified complication: Secondary | ICD-10-CM

## 2021-10-09 DIAGNOSIS — I152 Hypertension secondary to endocrine disorders: Secondary | ICD-10-CM

## 2021-10-09 DIAGNOSIS — Z634 Disappearance and death of family member: Secondary | ICD-10-CM

## 2021-10-09 DIAGNOSIS — E1165 Type 2 diabetes mellitus with hyperglycemia: Secondary | ICD-10-CM

## 2021-10-09 DIAGNOSIS — E785 Hyperlipidemia, unspecified: Secondary | ICD-10-CM

## 2021-10-09 MED ORDER — ROSUVASTATIN CALCIUM 20 MG PO TABS
ORAL_TABLET | Freq: Every day | ORAL | 1 refills | Status: AC
  Filled 2021-10-09: qty 90, 90d supply, fill #0

## 2021-10-09 MED ORDER — LISINOPRIL-HYDROCHLOROTHIAZIDE 10-12.5 MG PO TABS
1.0000 | ORAL_TABLET | Freq: Every day | ORAL | 1 refills | Status: DC
  Filled 2021-10-09: qty 90, 90d supply, fill #0

## 2021-10-09 MED ORDER — INSULIN GLARGINE 100 UNIT/ML SOLOSTAR PEN
15.0000 [IU] | PEN_INJECTOR | Freq: Every day | SUBCUTANEOUS | 6 refills | Status: DC
  Filled 2021-10-09: qty 15, 100d supply, fill #0

## 2021-10-09 MED ORDER — OZEMPIC (0.25 OR 0.5 MG/DOSE) 2 MG/3ML ~~LOC~~ SOPN
0.2500 mg | PEN_INJECTOR | SUBCUTANEOUS | 1 refills | Status: DC
  Filled 2021-10-09: qty 3, 56d supply, fill #0

## 2021-10-09 NOTE — Progress Notes (Signed)
Virtual Visit via Telephone Note  I connected with Brandon Finley, on 10/09/2021 at 9:24 AM by telephone and verified that I am speaking with the correct person using two identifiers.   Consent: I discussed the limitations, risks, security and privacy concerns of performing an evaluation and management service by telephone and the availability of in person appointments. I also discussed with the patient that there may be a patient responsible charge related to this service. The patient expressed understanding and agreed to proceed.   Location of Patient: Home  Location of Provider: Clinic   Persons participating in Telemedicine visit: Brandon Finley Dr. Margarita Rana     History of Present Illness: Brandon Finley is a 61 y.o. 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 Last seen in the Clinic in 06/2020.Marland Kitchen He lost his Dad 2 years ago and he went into a great Depression.  He needs a form for Diabetic shoes completed. He only has been taking Victoza but has not been taking Lantus and Glipizide.  Has not been checking his blood sugars either. He has also not been taking his statin or antihypertensive.  He complains of the skin on his penis being retracted to the point that he cannot return the foreskin.  He is not circumcised.  In the past has been treated for Candida balanitis. Past Medical History:  Diagnosis Date   Arthritis    Hypertension    Allergies  Allergen Reactions   No Known Allergies     Current Outpatient Medications on File Prior to Visit  Medication Sig Dispense Refill   Accu-Chek Softclix Lancets lancets Use as instructed 100 each 12   aspirin EC 81 MG tablet Take 1 tablet (81 mg total) by mouth daily. 90 tablet 1   Blood Glucose Monitoring Suppl (ACCU-CHEK GUIDE) w/Device KIT use to Test blood sugar  3  times daily 1 kit 0   Blood Glucose Monitoring Suppl (ONETOUCH VERIO) w/Device KIT Use to check blood sugar up to 3 times daily. 1 kit 0   cetirizine (ZYRTEC) 10 MG tablet Take 1 tablet (10 mg total) by mouth daily. 30 tablet 1   glipiZIDE (GLUCOTROL) 10 MG tablet TAKE 1 TABLET (10 MG TOTAL) BY MOUTH 2 (TWO) TIMES DAILY BEFORE A MEAL. 180 tablet 1   glucose blood (ACCU-CHEK GUIDE) test strip Use as instructed 100 each 12   glucose blood (ONETOUCH VERIO) test strip Use as instructed 100 each 11   insulin glargine (LANTUS) 100 UNIT/ML Solostar Pen Inject 15 Units into the skin daily. 30 mL 6   Insulin Pen Needle (TRUEPLUS PEN NEEDLES) 32G X 4 MM MISC Use to inject insulin and Victoza twice daily 100 each 6   lidocaine (LIDODERM) 5 % Place 1 patch onto the skin daily. Remove & Discard patch within 12 hours or as directed by MD 30 patch 0   liraglutide (VICTOZA) 18 MG/3ML SOPN INJECT 1.8 MG INTO THE SKIN DAILY WITH BREAKFAST. 30 mL 6   lisinopril-hydrochlorothiazide (ZESTORETIC) 10-12.5 MG tablet Take 1 tablet by mouth every day 30 tablet 0   methocarbamol (ROBAXIN) 500 MG tablet Take 2 tablets (1,000 mg total) by mouth 2 (two) times daily. 120 tablet 2   nabumetone (RELAFEN) 750 MG tablet Take 1 tablet by mouth twice daily as needed 60 tablet 11   OneTouch Delica Lancets 97L MISC Use to check  blood sugar up to 3 times daily. 100 each 11   PEG 3350-KCl-Na Bicarb-NaCl (GAVILYTE-N WITH FLAVOR PACK PO)      pravastatin (PRAVACHOL) 20 MG tablet Take 20 mg by mouth daily.     rosuvastatin (CRESTOR) 20 MG tablet TAKE 1 TABLET (20 MG TOTAL) BY MOUTH DAILY. 90 tablet 1   [DISCONTINUED] atorvastatin (LIPITOR) 80 MG tablet Take 1 tablet (80 mg total) by mouth daily. 90 tablet 1   No current facility-administered medications on file prior to visit.    ROS: See HPI  Observations/Objective: Awake, alert, oriented x3 Not in acute distress Normal mood      Latest Ref Rng & Units 04/06/2020    2:22 PM  04/29/2019    9:50 AM 07/10/2018   11:30 AM  CMP  Glucose 65 - 99 mg/dL 170  348  258   BUN 6 - 24 mg/dL _0 Creatinine 0.76 - 1.27 mg/dL 0.72  0.68  0.90   Sodium 134 - 144 mmol/L 138  135  136   Potassium 3.5 - 5.2 mmol/L 4.3  4.3  4.4   Chloride 96 - 106 mmol/L 99  97  101   CO2 20 - 29 mmol/L _1 Calcium 8.7 - 10.2 mg/dL 9.7  10.1  9.7   Total Protein 6.0 - 8.5 g/dL 8.1  8.1  7.9   Total Bilirubin 0.0 - 1.2 mg/dL 0.4  0.5  0.5   Alkaline Phos 44 - 121 IU/L 93  117  88   AST 0 - 40 IU/L 50  29  41   ALT 0 - 44 IU/L 56  55  59     Lipid Panel     Component Value Date/Time   CHOL 196 04/06/2020 1422   TRIG 98 04/06/2020 1422   HDL 31 (L) 04/06/2020 1422   CHOLHDL 6.3 (H) 04/06/2020 1422   CHOLHDL 6.5 07/25/2012 1047   VLDL 16 07/25/2012 1047   LDLCALC 147 (H) 04/06/2020 1422   LABVLDL 18 04/06/2020 1422    Lab Results  Component Value Date   HGBA1C 9.9 (A) 07/11/2020    Assessment and Plan: 1. Type 2 diabetes mellitus with hyperglycemia, without long-term current use of insulin ([place]) Uncontrolled with last A1c of 9.9 from 06/2020 Goal A1c is less than 7.0 Anticipate worsening of his A1c due to medication nonadherence Bereavement contributing Advised to resume Lantus at 15 units nightly We will substitute Victoza with Ozempic with plans to titrate Ozempic at next in person visit We will have him do his A1c prior to his visit If A1c is above goal no regimen change will be made given he has been nonadherent with his medication. Counseled on Diabetic diet, my plate method, 188 minutes of moderate intensity exercise/week Blood sugar logs with fasting goals of 80-120 mg/dl, random of less than 180 and in the event of sugars less than 60 mg/dl or greater than 400 mg/dl encouraged to notify the clinic. Advised on the need for annual eye exams, annual foot exams, Pneumonia vaccine. - Microalbumin / creatinine urine ratio; Future - LP+Non-HDL Cholesterol;  Future - CMP14+EGFR; Future - Semaglutide,0.25 or 0.5MG/DOS, (OZEMPIC, 0.25 OR 0.5 MG/DOSE,) 2 MG/3ML SOPN; Inject 0.25 mg into the skin once a week.  Dispense: 3 mL; Refill: 1 - insulin glargine (LANTUS) 100 UNIT/ML Solostar Pen; Inject 15 Units into the skin daily.  Dispense: 30 mL; Refill: 6 - Hemoglobin A1c; Future  2. Paraphimosis This has been recurrent He is uncircumcised Advised that if he has severe pain he needs to present to the ED - Ambulatory referral to Urology  3. Hypertension associated with diabetes ([place]) Uncontrolled due to medication non adherence Counseled on blood pressure goal of less than 130/80, low-sodium, DASH diet, medication compliance, 150 minutes of moderate intensity exercise per week. Discussed medication compliance, adverse effects. - lisinopril-hydrochlorothiazide (ZESTORETIC) 10-12.5 MG tablet; Take 1 tablet by mouth daily.  Dispense: 90 tablet; Refill: 1  4. Hyperlipidemia associated with type 2 diabetes mellitus ([place]) If lipid panel is elevated I will make no regimen changes given he has been nonadherent with his medications - rosuvastatin (CRESTOR) 20 MG tablet; TAKE 1 TABLET (20 MG TOTAL) BY MOUTH DAILY.  Dispense: 90 tablet; Refill: 1  5. Bereavement Last states that 2 years ago States he is in a better place and he declines grief counseling Discussed available resources in the clinic-LCSW for counseling Follow-up at next visit   Follow Up Instructions: Return in about 1 month (around 11/08/2021) for Diabetes follow-up.    I discussed the assessment and treatment plan with the patient. The patient was provided an opportunity to ask questions and all were answered. The patient agreed with the plan and demonstrated an understanding of the instructions.   The patient was advised to call back or seek an in-person evaluation if the symptoms worsen or if the condition fails to improve as anticipated.     I provided 15 minutes total of  non-face-to-face time during this encounter.   Charlott Rakes, MD, FAAFP. [facility], [place]   10/09/2021, 9:24 AM

## 2021-10-11 ENCOUNTER — Ambulatory Visit: Payer: Medicare Other | Admitting: Podiatry

## 2021-10-13 ENCOUNTER — Other Ambulatory Visit: Payer: Self-pay

## 2021-10-13 ENCOUNTER — Ambulatory Visit: Payer: Medicare Other | Attending: Family Medicine

## 2021-10-13 DIAGNOSIS — E1165 Type 2 diabetes mellitus with hyperglycemia: Secondary | ICD-10-CM

## 2021-10-17 LAB — MICROALBUMIN / CREATININE URINE RATIO
Creatinine, Urine: 87.9 mg/dL
Microalb/Creat Ratio: 73 mg/g creat — ABNORMAL HIGH (ref 0–29)
Microalbumin, Urine: 63.9 ug/mL

## 2021-10-17 LAB — CMP14+EGFR
ALT: 58 IU/L — ABNORMAL HIGH (ref 0–44)
AST: 57 IU/L — ABNORMAL HIGH (ref 0–40)
Albumin/Globulin Ratio: 1.3 (ref 1.2–2.2)
Albumin: 4.4 g/dL (ref 3.8–4.9)
Alkaline Phosphatase: 120 IU/L (ref 44–121)
BUN/Creatinine Ratio: 8 — ABNORMAL LOW (ref 10–24)
BUN: 6 mg/dL — ABNORMAL LOW (ref 8–27)
Bilirubin Total: 0.5 mg/dL (ref 0.0–1.2)
CO2: 23 mmol/L (ref 20–29)
Calcium: 10 mg/dL (ref 8.6–10.2)
Chloride: 97 mmol/L (ref 96–106)
Creatinine, Ser: 0.71 mg/dL — ABNORMAL LOW (ref 0.76–1.27)
Globulin, Total: 3.5 g/dL (ref 1.5–4.5)
Glucose: 338 mg/dL — ABNORMAL HIGH (ref 70–99)
Potassium: 4.2 mmol/L (ref 3.5–5.2)
Sodium: 136 mmol/L (ref 134–144)
Total Protein: 7.9 g/dL (ref 6.0–8.5)
eGFR: 105 mL/min/{1.73_m2} (ref >59)

## 2021-10-17 LAB — LP+NON-HDL CHOLESTEROL
Cholesterol, Total: 220 mg/dL — ABNORMAL HIGH (ref 100–199)
HDL: 38 mg/dL — ABNORMAL LOW (ref >39)
LDL Chol Calc (NIH): 140 mg/dL — ABNORMAL HIGH (ref 0–99)
Total Non-HDL-Chol (LDL+VLDL): 182 mg/dL — ABNORMAL HIGH (ref 0–129)
Triglycerides: 230 mg/dL — ABNORMAL HIGH (ref 0–149)
VLDL Cholesterol Cal: 42 mg/dL — ABNORMAL HIGH (ref 5–40)

## 2021-10-17 LAB — HEMOGLOBIN A1C
Est. average glucose Bld gHb Est-mCnc: 301 mg/dL
Hgb A1c MFr Bld: 12.1 % — ABNORMAL HIGH (ref 4.8–5.6)

## 2021-10-23 ENCOUNTER — Other Ambulatory Visit: Payer: Self-pay

## 2021-11-08 ENCOUNTER — Ambulatory Visit (INDEPENDENT_AMBULATORY_CARE_PROVIDER_SITE_OTHER): Payer: Medicare Other | Admitting: Podiatry

## 2021-11-08 DIAGNOSIS — M79675 Pain in left toe(s): Secondary | ICD-10-CM | POA: Diagnosis not present

## 2021-11-08 DIAGNOSIS — B351 Tinea unguium: Secondary | ICD-10-CM

## 2021-11-08 DIAGNOSIS — M79674 Pain in right toe(s): Secondary | ICD-10-CM | POA: Diagnosis not present

## 2021-11-08 DIAGNOSIS — E119 Type 2 diabetes mellitus without complications: Secondary | ICD-10-CM | POA: Diagnosis not present

## 2021-11-08 NOTE — Progress Notes (Signed)
This patient returns to my office for at risk foot care.  This patient requires this care by a professional since this patient will be at risk due to having  Diabetes.   This patient is unable to cut nails himself since the patient cannot reach his nails.These nails are painful walking and wearing shoes.  This patient presents for at risk foot care today.   General Appearance  Alert, conversant and in no acute stress.  Vascular  Dorsalis pedis and posterior tibial  pulses are palpable  bilaterally.  Capillary return is within normal limits  bilaterally. Temperature is within normal limits  bilaterally.  Neurologic  Senn-Weinstein monofilament wire test diminished bilaterally. Muscle power within normal limits bilaterally.  Nails Thick disfigured discolored nails with subungual debris  from hallux to fifth toes bilaterally. No evidence of bacterial infection or drainage bilaterally.  Orthopedic  No limitations of motion  feet .  No crepitus or effusions noted.  No bony pathology or digital deformities noted.  HAV  B/L.  Skin  normotropic skin with no porokeratosis noted bilaterally.  No signs of infections or ulcers noted.     Onychomycosis  Pain in right toes  Pain in left toes  Consent was obtained for treatment procedures.   Mechanical debridement of nails 1-5  bilaterally performed with a nail nipper.  Filed with dremel without incident.     Return office visit  3 months                    Told patient to return for periodic foot care and evaluation due to potential at risk complications.   Irvine Glorioso DPM  

## 2021-11-13 ENCOUNTER — Ambulatory Visit (INDEPENDENT_AMBULATORY_CARE_PROVIDER_SITE_OTHER): Payer: Medicare Other | Admitting: *Deleted

## 2021-11-13 DIAGNOSIS — M2010 Hallux valgus (acquired), unspecified foot: Secondary | ICD-10-CM

## 2021-11-13 DIAGNOSIS — E119 Type 2 diabetes mellitus without complications: Secondary | ICD-10-CM

## 2021-11-13 DIAGNOSIS — M2011 Hallux valgus (acquired), right foot: Secondary | ICD-10-CM | POA: Diagnosis not present

## 2021-11-13 DIAGNOSIS — M2012 Hallux valgus (acquired), left foot: Secondary | ICD-10-CM | POA: Diagnosis not present

## 2021-11-13 NOTE — Progress Notes (Signed)
Patient presents today to pick up diabetic shoes and insoles.  Patient was dispensed 1 pair of diabetic shoes and 3 pairs of foam casted diabetic insoles. Fit was satisfactory. Instructions for break-in and wear was reviewed and a copy was given to the patient.   Re-appointment for regularly scheduled diabetic foot care visits or if they should experience any trouble with the shoes or insoles.  

## 2021-11-20 ENCOUNTER — Other Ambulatory Visit: Payer: Self-pay | Admitting: Pharmacist

## 2021-11-20 ENCOUNTER — Other Ambulatory Visit: Payer: Self-pay

## 2021-11-20 ENCOUNTER — Ambulatory Visit: Payer: Medicare Other | Attending: Family Medicine | Admitting: Family Medicine

## 2021-11-20 ENCOUNTER — Encounter: Payer: Self-pay | Admitting: Family Medicine

## 2021-11-20 VITALS — BP 140/85 | HR 89 | Temp 98.4°F | Ht 73.0 in | Wt 296.4 lb

## 2021-11-20 DIAGNOSIS — E1169 Type 2 diabetes mellitus with other specified complication: Secondary | ICD-10-CM

## 2021-11-20 DIAGNOSIS — E1159 Type 2 diabetes mellitus with other circulatory complications: Secondary | ICD-10-CM

## 2021-11-20 DIAGNOSIS — I152 Hypertension secondary to endocrine disorders: Secondary | ICD-10-CM | POA: Diagnosis not present

## 2021-11-20 DIAGNOSIS — E1165 Type 2 diabetes mellitus with hyperglycemia: Secondary | ICD-10-CM | POA: Diagnosis not present

## 2021-11-20 DIAGNOSIS — E785 Hyperlipidemia, unspecified: Secondary | ICD-10-CM | POA: Diagnosis not present

## 2021-11-20 DIAGNOSIS — Z6841 Body Mass Index (BMI) 40.0 and over, adult: Secondary | ICD-10-CM

## 2021-11-20 DIAGNOSIS — Z23 Encounter for immunization: Secondary | ICD-10-CM | POA: Diagnosis not present

## 2021-11-20 LAB — GLUCOSE, POCT (MANUAL RESULT ENTRY): POC Glucose: 296 mg/dl — AB (ref 70–99)

## 2021-11-20 MED ORDER — OZEMPIC (0.25 OR 0.5 MG/DOSE) 2 MG/3ML ~~LOC~~ SOPN
0.5000 mg | PEN_INJECTOR | SUBCUTANEOUS | 1 refills | Status: DC
  Filled 2021-11-20: qty 3, 28d supply, fill #0

## 2021-11-20 MED ORDER — ACCU-CHEK GUIDE VI STRP
ORAL_STRIP | 12 refills | Status: DC
  Filled 2021-11-20: qty 100, 33d supply, fill #0

## 2021-11-20 MED ORDER — ACCU-CHEK GUIDE W/DEVICE KIT
PACK | 0 refills | Status: DC
  Filled 2021-11-20: qty 1, fill #0

## 2021-11-20 MED ORDER — ACCU-CHEK SOFTCLIX LANCETS MISC
12 refills | Status: AC
  Filled 2021-11-20: qty 100, 33d supply, fill #0
  Filled 2022-09-19: qty 100, 33d supply, fill #1

## 2021-11-20 MED ORDER — INSULIN GLARGINE 100 UNIT/ML SOLOSTAR PEN
10.0000 [IU] | PEN_INJECTOR | Freq: Every day | SUBCUTANEOUS | 6 refills | Status: DC
  Filled 2021-11-20: qty 30, 300d supply, fill #0

## 2021-11-20 MED ORDER — OZEMPIC (0.25 OR 0.5 MG/DOSE) 2 MG/1.5ML ~~LOC~~ SOPN
0.5000 mg | PEN_INJECTOR | SUBCUTANEOUS | 3 refills | Status: DC
  Filled 2021-11-20: qty 2, 35d supply, fill #0

## 2021-11-20 NOTE — Progress Notes (Unsigned)
Subjective:  Patient ID: Brandon Finley, male    DOB: [birth year]  Age: 61 y.o. MRN: [id number]  CC: Diabetes   HPI Brandon Finley is a 61 y.o. 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   Interval History:  He has been administering Ozemic but not Lantus even though we had discussed restarting Lantus at his last visit. Endorses taking his Glipizide. He does not check his sugars due to not having a Glucometer even though I have sent a Prescription a couple of times to his Pharmacy. Denies presence of neuropathy, visual complains or hypoglycemic symptoms . Last eye exam was last year at [facility]. He has been using a lot of mayonnaise in his sandwiches and drinking a lot of sodas, not adhering to a diabetic diet. [facility]  He endorses adherence with his statin and antihypertensive. Denies additional concerns today. Past Medical History:  Diagnosis Date   Arthritis    Hypertension     Past Surgical History:  Procedure Laterality Date   TOTAL HIP ARTHROPLASTY Right 03/13/2016   Procedure: RIGHT TOTAL HIP ARTHROPLASTY ANTERIOR APPROACH;  Surgeon: Mcarthur Rossetti, MD;  Location: [facility];  Service: Orthopedics;  Laterality: Right;    Family History  Problem Relation Age of Onset   Hypertension Father     Social History   Socioeconomic History   Marital status: Single    Spouse name: Not on file   Number of children: 1   Years of education: 43    Highest education level: Not on file  Occupational History   Occupation: Biochemist, clinical   Tobacco Use   Smoking status: Former    Types: Cigarettes    Quit date: 01/16/1995    Years since quitting: 26.8   Smokeless tobacco: Never  Substance and Sexual Activity   Alcohol use: No   Drug use: No   Sexual activity: Never  Other Topics Concern   Not on  file  Social History Narrative   Not on file   Social Determinants of Health   Financial Resource Strain: Not on file  Food Insecurity: Not on file  Transportation Needs: Not on file  Physical Activity: Not on file  Stress: Not on file  Social Connections: Not on file    Allergies  Allergen Reactions   No Known Allergies     Outpatient Medications Prior to Visit  Medication Sig Dispense Refill   Accu-Chek Softclix Lancets lancets Use as instructed 100 each 12   aspirin EC 81 MG tablet Take 1 tablet (81 mg total) by mouth daily. 90 tablet 1   cetirizine (ZYRTEC) 10 MG tablet Take 1 tablet (10 mg total) by mouth daily. 30 tablet 1   Insulin Pen Needle (TRUEPLUS PEN NEEDLES) 32G X 4 MM MISC Use to inject insulin and Victoza twice daily 100 each 6   lidocaine (LIDODERM) 5 % Place 1 patch onto the skin daily. Remove & Discard patch within 12 hours or as directed by MD 30 patch 0   lisinopril-hydrochlorothiazide (ZESTORETIC) 10-12.5 MG tablet Take 1 tablet by mouth daily. 90 tablet 1   methocarbamol (ROBAXIN) 500 MG tablet Take 2 tablets (1,000 mg total) by mouth 2 (two) times daily. 120 tablet 2   nabumetone (RELAFEN) 750 MG tablet Take 1 tablet by mouth twice daily as needed 60 tablet 11   PEG 3350-KCl-Na  Bicarb-NaCl (GAVILYTE-N WITH FLAVOR PACK PO)      rosuvastatin (CRESTOR) 20 MG tablet TAKE 1 TABLET (20 MG TOTAL) BY MOUTH DAILY. 90 tablet 1   Blood Glucose Monitoring Suppl (ACCU-CHEK GUIDE) w/Device KIT use to Test blood sugar  3 times daily 1 kit 0   Blood Glucose Monitoring Suppl (ONETOUCH VERIO) w/Device KIT Use to check blood sugar up to 3 times daily. 1 kit 0   glucose blood (ACCU-CHEK GUIDE) test strip Use as instructed 100 each 12   glucose blood (ONETOUCH VERIO) test strip Use as instructed 100 each 11   insulin glargine (LANTUS) 100 UNIT/ML Solostar Pen Inject 15 Units into the skin daily. 30 mL 6   OneTouch Delica Lancets 62I MISC Use to check blood sugar up to 3 times  daily. 100 each 11   Semaglutide,0.25 or 0.5MG/DOS, (OZEMPIC, 0.25 OR 0.5 MG/DOSE,) 2 MG/3ML SOPN Inject 0.25 mg into the skin once a week. 3 mL 1   glipiZIDE (GLUCOTROL) 10 MG tablet TAKE 1 TABLET (10 MG TOTAL) BY MOUTH 2 (TWO) TIMES DAILY BEFORE A MEAL. 180 tablet 1   No facility-administered medications prior to visit.     ROS Review of Systems  Constitutional:  Negative for activity change and appetite change.  HENT:  Negative for sinus pressure and sore throat.   Respiratory:  Negative for chest tightness, shortness of breath and wheezing.   Cardiovascular:  Negative for chest pain and palpitations.  Gastrointestinal:  Negative for abdominal distention, abdominal pain and constipation.  Genitourinary: Negative.   Musculoskeletal: Negative.   Psychiatric/Behavioral:  Negative for behavioral problems and dysphoric mood.     Objective:  BP (!) 140/85 (BP Location: Left Arm, Cuff Size: Large)   Pulse 89   Temp 98.4 F (36.9 C) (Oral)   Ht _0  (1.854 m)   Wt 296 lb 6.4 oz (134.4 kg)   SpO2 95%   BMI 39.11 kg/m      11/20/2021    2:58 PM 11/20/2021    2:25 PM 07/11/2020    3:20 PM  BP/Weight  Systolic BP 948 546 270  Diastolic BP 85 82 84  Wt. (Lbs)  296.4 310  BMI  39.11 kg/m2 40.9 kg/m2      Physical Exam Constitutional:      Appearance: He is well-developed.  Cardiovascular:     Rate and Rhythm: Normal rate.     Heart sounds: Normal heart sounds. No murmur heard. Pulmonary:     Effort: Pulmonary effort is normal.     Breath sounds: Normal breath sounds. No wheezing or rales.  Chest:     Chest wall: No tenderness.  Abdominal:     General: Bowel sounds are normal. There is no distension.     Palpations: Abdomen is soft. There is no mass.     Tenderness: There is no abdominal tenderness.  Musculoskeletal:        General: Normal range of motion.     Right lower leg: No edema.     Left lower leg: No edema.  Neurological:     Mental Status: He is alert and  oriented to person, place, and time.  Psychiatric:        Mood and Affect: Mood normal.        Latest Ref Rng & Units 10/13/2021    9:43 AM 04/06/2020    2:22 PM 04/29/2019    9:50 AM  CMP  Glucose 70 - 99 mg/dL 338  170  348  BUN 8 - 27 mg/dL _0 Creatinine 0.76 - 1.27 mg/dL 0.71  0.72  0.68   Sodium 134 - 144 mmol/L 136  138  135   Potassium 3.5 - 5.2 mmol/L 4.2  4.3  4.3   Chloride 96 - 106 mmol/L 97  99  97   CO2 20 - 29 mmol/L _1 Calcium 8.6 - 10.2 mg/dL 10.0  9.7  10.1   Total Protein 6.0 - 8.5 g/dL 7.9  8.1  8.1   Total Bilirubin 0.0 - 1.2 mg/dL 0.5  0.4  0.5   Alkaline Phos 44 - 121 IU/L 120  93  117   AST 0 - 40 IU/L 57  50  29   ALT 0 - 44 IU/L 58  56  55     Lipid Panel     Component Value Date/Time   CHOL 220 (H) 10/13/2021 0943   TRIG 230 (H) 10/13/2021 0943   HDL 38 (L) 10/13/2021 0943   CHOLHDL 6.3 (H) 04/06/2020 1422   CHOLHDL 6.5 07/25/2012 1047   VLDL 16 07/25/2012 1047   LDLCALC 140 (H) 10/13/2021 0943    CBC    Component Value Date/Time   WBC 11.2 (H) 03/14/2016 0756   RBC 3.98 (L) 03/14/2016 0756   HGB 12.6 (L) 03/14/2016 0756   HCT 38.2 (L) 03/14/2016 0756   PLT 212 03/14/2016 0756   MCV 96.0 03/14/2016 0756   MCH 31.7 03/14/2016 0756   MCHC 33.0 03/14/2016 0756   RDW 12.0 03/14/2016 0756    Lab Results  Component Value Date   HGBA1C 12.1 (H) 10/13/2021    Assessment & Plan:  1. Type 2 diabetes mellitus with hyperglycemia, without long-term current use of insulin ([facility]) Uncontrolled with A1c of 12.1, goal is <7.0 He had endorses medication non adherence at the time A1c was drawn Advised to restart Lantus and he will follow up with the Clinical Pharmacist to uptitrate his medications He will also bring in his Glucometer for education on appropriate use Counseled on Diabetic diet, my plate method, 048 minutes of moderate intensity exercise/week Blood sugar logs with fasting goals of 80-120 mg/dl, random of less than 180  and in the event of sugars less than 60 mg/dl or greater than 400 mg/dl encouraged to notify the clinic. Advised on the need for annual eye exams, annual foot exams, Pneumonia vaccine. - POCT glucose (manual entry) - POCT glycosylated hemoglobin (Hb A1C) - insulin glargine (LANTUS) 100 UNIT/ML Solostar Pen; Inject 10 Units into the skin daily.  Dispense: 30 mL; Refill: 6 - Blood Glucose Monitoring Suppl (ACCU-CHEK GUIDE) w/Device KIT; use to Test blood sugar  3 times daily  Dispense: 1 kit; Refill: 0 - glucose blood (ACCU-CHEK GUIDE) test strip; Use as instructed  Dispense: 100 each; Refill: 12 - Accu-Chek Softclix Lancets lancets; Use as instructed 3 times daily before meals  Dispense: 100 each; Refill: 12  2. Hypertension associated with diabetes ([place]) Slightly above goal No regimen change today, continue Lisinopril/HCTZ Will recheck at next visit Counseled on blood pressure goal of less than 130/80, low-sodium, DASH diet, medication compliance, 150 minutes of moderate intensity exercise per week. Discussed medication compliance, adverse effects.   3. Hyperlipidemia associated with type 2 diabetes mellitus ([place]) Uncontrolled He had been non adherent with medications a the time of labs Will plan to recheck at next visit  4. Morbid obesity with BMI of 40.0-44.9, adult ([place]) Counseled  of caloric restriction, avoiding late meals  5. Need for immunization against influenza - Flu Vaccine QUAD 31moIM (Fluarix, Fluzone & Alfiuria Quad PF)    Meds ordered this encounter  Medications   DISCONTD: Semaglutide,0.25 or 0.5MG/DOS, (OZEMPIC, 0.25 OR 0.5 MG/DOSE,) 2 MG/1.5ML SOPN    Sig: Inject 0.5 mg into the skin once a week.    Dispense:  2 mL    Refill:  3    Dose increase   insulin glargine (LANTUS) 100 UNIT/ML Solostar Pen    Sig: Inject 10 Units into the skin daily.    Dispense:  30 mL    Refill:  6    Dose change   Blood Glucose Monitoring Suppl (ACCU-CHEK GUIDE) w/Device KIT     Sig: use to Test blood sugar  3 times daily    Dispense:  1 kit    Refill:  0   glucose blood (ACCU-CHEK GUIDE) test strip    Sig: Use as instructed    Dispense:  100 each    Refill:  12    pt is testing 3 times daily -shayla   Accu-Chek Softclix Lancets lancets    Sig: Use as instructed 3 times daily before meals    Dispense:  100 each    Refill:  12    Follow-up: Return for Blood sugar evaluation with LLurena Joiner Chronic medical conditions in 2 months.       ECharlott Rakes MD, FAAFP. [facility] [place]  11/20/2021, 6:15 PM

## 2021-11-21 LAB — POCT GLYCOSYLATED HEMOGLOBIN (HGB A1C): HbA1c, POC (controlled diabetic range): 10.7 % — AB (ref 0.0–7.0)

## 2021-11-24 ENCOUNTER — Other Ambulatory Visit: Payer: Self-pay

## 2021-11-27 ENCOUNTER — Ambulatory Visit (INDEPENDENT_AMBULATORY_CARE_PROVIDER_SITE_OTHER): Payer: Medicare Other | Admitting: *Deleted

## 2021-11-27 DIAGNOSIS — M2010 Hallux valgus (acquired), unspecified foot: Secondary | ICD-10-CM

## 2021-11-27 DIAGNOSIS — E119 Type 2 diabetes mellitus without complications: Secondary | ICD-10-CM | POA: Diagnosis not present

## 2021-11-27 DIAGNOSIS — M2012 Hallux valgus (acquired), left foot: Secondary | ICD-10-CM | POA: Diagnosis not present

## 2021-11-27 DIAGNOSIS — M2011 Hallux valgus (acquired), right foot: Secondary | ICD-10-CM

## 2021-11-27 NOTE — Progress Notes (Signed)
Patient presents today to pick up diabetic shoes and insoles.  Patient was dispensed 1 pair of diabetic shoes and 3 pairs of foam casted diabetic insoles. Fit was satisfactory. Instructions for break-in and wear was reviewed and a copy was given to the patient.   Re-appointment for regularly scheduled diabetic foot care visits or if they should experience any trouble with the shoes or insoles.  

## 2021-12-05 ENCOUNTER — Ambulatory Visit: Payer: Medicare Other

## 2021-12-18 ENCOUNTER — Ambulatory Visit: Payer: Self-pay | Admitting: *Deleted

## 2021-12-18 NOTE — Telephone Encounter (Signed)
Routing to PCP for review.

## 2021-12-18 NOTE — Telephone Encounter (Signed)
  Chief Complaint: left hip pain- chronic, medication request Symptoms: pain in hip- constant- sometimes has pain into leg. 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 Patient advised PCP may not Rx pain medication without seeing him- even though he has been in office recently and has upcoming  appointment.

## 2021-12-18 NOTE — Telephone Encounter (Signed)
Summary: Hip pain   Patient states that he has had pain in left hip for 2-3 weeks. Patient is requesting Tramadol.         Reason for Disposition  [1] SEVERE pain (e.g., excruciating, unable to do any normal activities) AND [2] not improved after 2 hours of pain medicine  Answer Assessment - Initial Assessment Questions 1. LOCATION and RADIATION: "Where is the pain located?"      Left hip 2. QUALITY: "What does the pain feel like?"  (e.g., sharp, dull, aching, burning)     Sharp- feels like knife 3. SEVERITY: "How bad is the pain?" "What does it keep you from doing?"   (Scale 1-10; or mild, moderate, severe)   -  MILD (1-3): doesn't interfere with normal activities    -  MODERATE (4-7): interferes with normal activities (e.g., work or school) or awakens from sleep, limping    -  SEVERE (8-10): excruciating pain, unable to do any normal activities, unable to walk     Constant- severe 4. ONSET: "When did the pain start?" "Does it come and go, or is it there all the time?"     Months 5. WORK OR EXERCISE: "Has there been any recent work or exercise that involved this part of the body?"      Active person 6. CAUSE: "What do you think is causing the hip pain?"      Arthritis in hip 7. AGGRAVATING FACTORS: "What makes the hip pain worse?" (e.g., walking, climbing stairs, running)     Walking, cold/rainy weather- stiffens 8. OTHER SYMPTOMS: "Do you have any other symptoms?" (e.g., back pain, pain shooting down leg,  fever, rash)     Pain into the leg  Protocols used: Hip Pain-A-AH

## 2021-12-19 NOTE — Telephone Encounter (Signed)
He currently sees Dr. Magnus Ivan for this who prescribes him nabumetone.  He needs to reach out to his orthopedic.

## 2021-12-20 NOTE — Telephone Encounter (Signed)
Pt called and informed to reach out to Dr.Blackmon for any pain medication request.

## 2021-12-26 ENCOUNTER — Other Ambulatory Visit: Payer: Self-pay

## 2021-12-26 ENCOUNTER — Ambulatory Visit: Payer: Medicare Other | Admitting: Pharmacist

## 2022-01-01 ENCOUNTER — Ambulatory Visit: Payer: Medicare Other | Admitting: Family Medicine

## 2022-01-23 ENCOUNTER — Ambulatory Visit: Payer: Self-pay | Admitting: Family Medicine

## 2022-01-24 ENCOUNTER — Ambulatory Visit (INDEPENDENT_AMBULATORY_CARE_PROVIDER_SITE_OTHER): Payer: 59 | Admitting: Orthopaedic Surgery

## 2022-01-24 ENCOUNTER — Ambulatory Visit (INDEPENDENT_AMBULATORY_CARE_PROVIDER_SITE_OTHER): Payer: 59

## 2022-01-24 ENCOUNTER — Encounter: Payer: Self-pay | Admitting: Orthopaedic Surgery

## 2022-01-24 VITALS — Wt 294.0 lb

## 2022-01-24 DIAGNOSIS — M25552 Pain in left hip: Secondary | ICD-10-CM

## 2022-01-24 DIAGNOSIS — M1612 Unilateral primary osteoarthritis, left hip: Secondary | ICD-10-CM | POA: Diagnosis not present

## 2022-01-24 MED ORDER — TRAMADOL HCL 50 MG PO TABS: 100.0000 mg | ORAL_TABLET | Freq: Two times a day (BID) | ORAL | 0 refills | Status: DC | PRN

## 2022-01-24 NOTE — Progress Notes (Signed)
The patient is somewhat of seen in the past.  We actually replaced his right hip coming up on 6 years ago.  At that point his hip pain was debilitating and severe.  He has been experiencing some left hip pain but has not the same severity as he had when he had his hip replaced on the right side.  He does have a occasional pinching sensation in his groin area on the left side.  [facility]  His BMI today is 38.79.  I was able to review his records since he is a diabetic.  His hemoglobin A1c in November was 10.7.  This was down from 12 in September.  On exam his right operative hip moves smoothly and fluidly.  His left hip actually has good rotation with a little bit of pain in the groin and some stiffness with rotation.  A standing as well as lateral of his left hip shows worsening arthritis of the left hip with joint space narrowing when compared to previous films.  It is not bone-on-bone wear yet and there is no cystic changes but his arthritis is slowly worsening.  His right hip replacement appears normal.  I counseled him extensively about carbohydrate and sugar intake.  He needs to work on weight loss as well.  I have recommended he get back into the [facility] due to his poorly controlled blood glucose.  He understands that we would not be able to proceed with any type of joint replacement surgery until his A1c is below 7.7.  I will send in some tramadol to use sparingly for pain.

## 2022-01-30 ENCOUNTER — Ambulatory Visit: Payer: Medicare Other | Admitting: Pharmacist

## 2022-01-30 ENCOUNTER — Other Ambulatory Visit: Payer: Self-pay

## 2022-01-31 ENCOUNTER — Other Ambulatory Visit: Payer: Self-pay | Admitting: Family Medicine

## 2022-01-31 DIAGNOSIS — I152 Hypertension secondary to endocrine disorders: Secondary | ICD-10-CM

## 2022-02-13 ENCOUNTER — Ambulatory Visit: Payer: 59 | Admitting: Podiatry

## 2022-03-05 ENCOUNTER — Ambulatory Visit: Payer: 59 | Admitting: Podiatry

## 2022-03-19 ENCOUNTER — Ambulatory Visit (INDEPENDENT_AMBULATORY_CARE_PROVIDER_SITE_OTHER): Payer: 59 | Admitting: Podiatry

## 2022-03-19 VITALS — BP 130/81 | HR 98

## 2022-03-19 DIAGNOSIS — E119 Type 2 diabetes mellitus without complications: Secondary | ICD-10-CM

## 2022-03-19 DIAGNOSIS — M79674 Pain in right toe(s): Secondary | ICD-10-CM

## 2022-03-19 DIAGNOSIS — M79675 Pain in left toe(s): Secondary | ICD-10-CM

## 2022-03-19 DIAGNOSIS — B351 Tinea unguium: Secondary | ICD-10-CM

## 2022-03-19 NOTE — Progress Notes (Signed)
This patient returns to my office for at risk foot care.  This patient requires this care by a professional since this patient will be at risk due to having  Diabetes.   This patient is unable to cut nails himself since the patient cannot reach his nails.These nails are painful walking and wearing shoes.  This patient presents for at risk foot care today.   General Appearance  Alert, conversant and in no acute stress.  Vascular  Dorsalis pedis and posterior tibial  pulses are palpable  bilaterally.  Capillary return is within normal limits  bilaterally. Temperature is within normal limits  bilaterally.  Neurologic  Senn-Weinstein monofilament wire test diminished bilaterally. Muscle power within normal limits bilaterally.  Nails Thick disfigured discolored nails with subungual debris  from hallux to fifth toes bilaterally. No evidence of bacterial infection or drainage bilaterally.  Orthopedic  No limitations of motion  feet .  No crepitus or effusions noted.  No bony pathology or digital deformities noted.  HAV  B/L.  Skin  normotropic skin with no porokeratosis noted bilaterally.  No signs of infections or ulcers noted.     Onychomycosis  Pain in right toes  Pain in left toes  Consent was obtained for treatment procedures.   Mechanical debridement of nails 1-5  bilaterally performed with a nail nipper.  Filed with dremel without incident.     Return office visit  3 months                    Told patient to return for periodic foot care and evaluation due to potential at risk complications.   Gardiner Barefoot DPM

## 2022-04-03 ENCOUNTER — Telehealth: Payer: Self-pay | Admitting: Family Medicine

## 2022-04-03 NOTE — Telephone Encounter (Signed)
Contacted Brandon Finley to schedule their annual wellness visit. Appointment made for 04/05/22.  Brandon Finley AWV direct phone # [phone]

## 2022-04-05 ENCOUNTER — Ambulatory Visit: Payer: 59 | Attending: Family Medicine

## 2022-04-05 VITALS — Ht 73.0 in | Wt 290.0 lb

## 2022-04-05 DIAGNOSIS — Z0000 Encounter for general adult medical examination without abnormal findings: Secondary | ICD-10-CM | POA: Diagnosis not present

## 2022-04-05 NOTE — Patient Instructions (Signed)
Brandon Finley , Thank you for taking time to come for your Medicare Wellness Visit. I appreciate your ongoing commitment to your health goals. Please review the following plan we discussed and let me know if I can assist you in the future.   These are the goals we discussed:  Goals      Patient Stated     04/05/2022, trying to get healthy        This is a list of the screening recommended for you and due dates:  Health Maintenance  Topic Date Due   COVID-19 Vaccine (1) Never done   Zoster (Shingles) Vaccine (1 of 2) Never done   Eye exam for diabetics  01/29/2019   Hemoglobin A1C  05/22/2022   Yearly kidney function blood test for diabetes  10/14/2022   Yearly kidney health urinalysis for diabetes  10/14/2022   Complete foot exam   11/21/2022   Medicare Annual Wellness Visit  04/05/2023   DTaP/Tdap/Td vaccine (2 - Td or Tdap) 05/23/2026   Colon Cancer Screening  01/29/2028   Flu Shot  Completed   Hepatitis C Screening: USPSTF Recommendation to screen - Ages 18-79 yo.  Completed   HIV Screening  Completed   HPV Vaccine  Aged Out    Advanced directives: Advance directive discussed with you today.   Conditions/risks identified: none  Next appointment: Follow up in one year for your annual wellness visit   Preventive Care 40-64 Years, Male Preventive care refers to lifestyle choices and visits with your health care provider that can promote health and wellness. What does preventive care include? A yearly physical exam. This is also called an annual well check. Dental exams once or twice a year. Routine eye exams. Ask your health care provider how often you should have your eyes checked. Personal lifestyle choices, including: Daily care of your teeth and gums. Regular physical activity. Eating a healthy diet. Avoiding tobacco and drug use. Limiting alcohol use. Practicing safe sex. Taking low-dose aspirin every day starting at age 96. What happens during an annual well  check? The services and screenings done by your health care provider during your annual well check will depend on your age, overall health, lifestyle risk factors, and family history of disease. Counseling  Your health care provider may ask you questions about your: Alcohol use. Tobacco use. Drug use. Emotional well-being. Home and relationship well-being. Sexual activity. Eating habits. Work and work Statistician. Screening  You may have the following tests or measurements: Height, weight, and BMI. Blood pressure. Lipid and cholesterol levels. These may be checked every 5 years, or more frequently if you are over 68 years old. Skin check. Lung cancer screening. You may have this screening every year starting at age 54 if you have a 30-pack-year history of smoking and currently smoke or have quit within the past 15 years. Fecal occult blood test (FOBT) of the stool. You may have this test every year starting at age 75. Flexible sigmoidoscopy or colonoscopy. You may have a sigmoidoscopy every 5 years or a colonoscopy every 10 years starting at age 56. Prostate cancer screening. Recommendations will vary depending on your family history and other risks. Hepatitis C blood test. Hepatitis B blood test. Sexually transmitted disease (STD) testing. Diabetes screening. This is done by checking your blood sugar (glucose) after you have not eaten for a while (fasting). You may have this done every 1-3 years. Discuss your test results, treatment options, and if necessary, the need for more tests  with your health care provider. Vaccines  Your health care provider may recommend certain vaccines, such as: Influenza vaccine. This is recommended every year. Tetanus, diphtheria, and acellular pertussis (Tdap, Td) vaccine. You may need a Td booster every 10 years. Zoster vaccine. You may need this after age 55. Pneumococcal 13-valent conjugate (PCV13) vaccine. You may need this if you have certain  conditions and have not been vaccinated. Pneumococcal polysaccharide (PPSV23) vaccine. You may need one or two doses if you smoke cigarettes or if you have certain conditions. Talk to your health care provider about which screenings and vaccines you need and how often you need them. This information is not intended to replace advice given to you by your health care provider. Make sure you discuss any questions you have with your health care provider. Document Released: 01/28/2015 Document Revised: 09/21/2015 Document Reviewed: 11/02/2014 Elsevier Interactive Patient Education  2017 [place] Prevention in the Home Falls can cause injuries. They can happen to people of all ages. There are many things you can do to make your home safe and to help prevent falls. What can I do on the outside of my home? Regularly fix the edges of walkways and driveways and fix any cracks. Remove anything that might make you trip as you walk through a door, such as a raised step or threshold. Trim any bushes or trees on the path to your home. Use bright outdoor lighting. Clear any walking paths of anything that might make someone trip, such as rocks or tools. Regularly check to see if handrails are loose or broken. Make sure that both sides of any steps have handrails. Any raised decks and porches should have guardrails on the edges. Have any leaves, snow, or ice cleared regularly. Use sand or salt on walking paths during winter. Clean up any spills in your garage right away. This includes oil or grease spills. What can I do in the bathroom? Use night lights. Install grab bars by the toilet and in the tub and shower. Do not use towel bars as grab bars. Use non-skid mats or decals in the tub or shower. If you need to sit down in the shower, use a plastic, non-slip stool. Keep the floor dry. Clean up any water that spills on the floor as soon as it happens. Remove soap buildup in the tub or shower  regularly. Attach bath mats securely with double-sided non-slip rug tape. Do not have throw rugs and other things on the floor that can make you trip. What can I do in the bedroom? Use night lights. Make sure that you have a light by your bed that is easy to reach. Do not use any sheets or blankets that are too big for your bed. They should not hang down onto the floor. Have a firm chair that has side arms. You can use this for support while you get dressed. Do not have throw rugs and other things on the floor that can make you trip. What can I do in the kitchen? Clean up any spills right away. Avoid walking on wet floors. Keep items that you use a lot in easy-to-reach places. If you need to reach something above you, use a strong step stool that has a grab bar. Keep electrical cords out of the way. Do not use floor polish or wax that makes floors slippery. If you must use wax, use non-skid floor wax. Do not have throw rugs and other things on the floor that  can make you trip. What can I do with my stairs? Do not leave any items on the stairs. Make sure that there are handrails on both sides of the stairs and use them. Fix handrails that are broken or loose. Make sure that handrails are as long as the stairways. Check any carpeting to make sure that it is firmly attached to the stairs. Fix any carpet that is loose or worn. Avoid having throw rugs at the top or bottom of the stairs. If you do have throw rugs, attach them to the floor with carpet tape. Make sure that you have a light switch at the top of the stairs and the bottom of the stairs. If you do not have them, ask someone to add them for you. What else can I do to help prevent falls? Wear shoes that: Do not have high heels. Have rubber bottoms. Are comfortable and fit you well. Are closed at the toe. Do not wear sandals. If you use a stepladder: Make sure that it is fully opened. Do not climb a closed stepladder. Make sure that  both sides of the stepladder are locked into place. Ask someone to hold it for you, if possible. Clearly mark and make sure that you can see: Any grab bars or handrails. First and last steps. Where the edge of each step is. Use tools that help you move around (mobility aids) if they are needed. These include: Canes. Walkers. Scooters. Crutches. Turn on the lights when you go into a dark area. Replace any light bulbs as soon as they burn out. Set up your furniture so you have a clear path. Avoid moving your furniture around. If any of your floors are uneven, fix them. If there are any pets around you, be aware of where they are. Review your medicines with your doctor. Some medicines can make you feel dizzy. This can increase your chance of falling. Ask your doctor what other things that you can do to help prevent falls. This information is not intended to replace advice given to you by your health care provider. Make sure you discuss any questions you have with your health care provider. Document Released: 10/28/2008 Document Revised: 06/09/2015 Document Reviewed: 02/05/2014 Elsevier Interactive Patient Education  2017 [employer].

## 2022-04-05 NOTE — Progress Notes (Signed)
I connected with  Brandon Finley on 04/05/22 by a audio enabled telemedicine application and verified that I am speaking with the correct person using two identifiers.  Patient Location: Home  Provider Location: Office/Clinic  I discussed the limitations of evaluation and management by telemedicine. The patient expressed understanding and agreed to proceed.  Subjective:   Brandon Finley is a 62 y.o. male who presents for an Initial Medicare Annual Wellness Visit.  Review of Systems     Cardiac Risk Factors include: advanced age (>52men, >30 women);diabetes mellitus;dyslipidemia;hypertension;male gender;obesity (BMI >30kg/m2)     Objective:    Today's Vitals   04/05/22 1626  Weight: 290 lb (131.5 kg)  Height: 6\' 1"  (1.854 m)   Body mass index is 38.26 kg/m.     04/05/2022    4:30 PM 11/24/2019   11:21 AM 03/12/2019   10:16 AM 03/10/2019    8:53 AM 10/15/2017   11:51 AM 09/24/2017    1:54 PM 01/16/2017    6:10 PM  Advanced Directives  Does Patient Have a Medical Advance Directive? No No No No No No No  Would patient like information on creating a medical advance directive?  No - Patient declined No - Patient declined Yes (MAU/Ambulatory/Procedural Areas - Information given) Yes (MAU/Ambulatory/Procedural Areas - Information given)  No - Patient declined    Current Medications (verified) Outpatient Encounter Medications as of 04/05/2022  Medication Sig   Accu-Chek Softclix Lancets lancets Use as instructed   Accu-Chek Softclix Lancets lancets Use as instructed 3 times daily before meals   aspirin EC 81 MG tablet Take 1 tablet (81 mg total) by mouth daily.   Blood Glucose Monitoring Suppl (ACCU-CHEK GUIDE) w/Device KIT use to Test blood sugar  3 times daily   cetirizine (ZYRTEC) 10 MG tablet Take 1 tablet (10 mg total) by mouth daily.   glucose blood (ACCU-CHEK GUIDE) test strip Use as instructed   insulin glargine (LANTUS) 100 UNIT/ML Solostar Pen Inject 10 Units into the  skin daily.   Insulin Pen Needle (TRUEPLUS PEN NEEDLES) 32G X 4 MM MISC Use to inject insulin and Victoza twice daily   lidocaine (LIDODERM) 5 % Place 1 patch onto the skin daily. Remove & Discard patch within 12 hours or as directed by MD   lisinopril-hydrochlorothiazide (ZESTORETIC) 10-12.5 MG tablet Take 1 tablet by mouth every day   methocarbamol (ROBAXIN) 500 MG tablet Take 2 tablets (1,000 mg total) by mouth 2 (two) times daily.   nabumetone (RELAFEN) 750 MG tablet Take 1 tablet by mouth twice daily as needed   rosuvastatin (CRESTOR) 20 MG tablet TAKE 1 TABLET (20 MG TOTAL) BY MOUTH DAILY.   Semaglutide,0.25 or 0.5MG /DOS, (OZEMPIC, 0.25 OR 0.5 MG/DOSE,) 2 MG/3ML SOPN Inject 0.5 mg into the skin once a week.   traMADol (ULTRAM) 50 MG tablet Take 2 tablets (100 mg total) by mouth every 12 (twelve) hours as needed.   glipiZIDE (GLUCOTROL) 10 MG tablet TAKE 1 TABLET (10 MG TOTAL) BY MOUTH 2 (TWO) TIMES DAILY BEFORE A MEAL.   PEG 3350-KCl-Na Bicarb-NaCl (GAVILYTE-N WITH FLAVOR PACK PO)    [DISCONTINUED] atorvastatin (LIPITOR) 80 MG tablet Take 1 tablet (80 mg total) by mouth daily.   No facility-administered encounter medications on file as of 04/05/2022.    Allergies (verified) No known allergies   History: Past Medical History:  Diagnosis Date   Arthritis    Hypertension    Past Surgical History:  Procedure Laterality Date   TOTAL HIP ARTHROPLASTY  Right 03/13/2016   Procedure: RIGHT TOTAL HIP ARTHROPLASTY ANTERIOR APPROACH;  Surgeon: Mcarthur Rossetti, MD;  Location: [facility];  Service: Orthopedics;  Laterality: Right;   Family History  Problem Relation Age of Onset   Hypertension Father    Social History   Socioeconomic History   Marital status: Single    Spouse name: Not on file   Number of children: 1   Years of education: 64    Highest education level: Not on file  Occupational History   Occupation: Biochemist, clinical   Tobacco Use   Smoking status: Former    Types:  Cigarettes    Quit date: 01/16/1995    Years since quitting: 27.2   Smokeless tobacco: Never  Vaping Use   Vaping Use: Never used  Substance and Sexual Activity   Alcohol use: No   Drug use: No   Sexual activity: Never  Other Topics Concern   Not on file  Social History Narrative   Not on file   Social Determinants of Health   Financial Resource Strain: Low Risk  (04/05/2022)   Overall Financial Resource Strain (CARDIA)    Difficulty of Paying Living Expenses: Not hard at all  Food Insecurity: No Food Insecurity (04/05/2022)   Hunger Vital Sign    Worried About Running Out of Food in the Last Year: Never true    [place] in the Last Year: Never true  Transportation Needs: No Transportation Needs (04/05/2022)   PRAPARE - Hydrologist (Medical): No    Lack of Transportation (Non-Medical): No  Physical Activity: Sufficiently Active (04/05/2022)   Exercise Vital Sign    Days of Exercise per Week: 3 days    Minutes of Exercise per Session: 90 min  Stress: No Stress Concern Present (04/05/2022)   [place]    Feeling of Stress : Not at all  Social Connections: Not on file    Tobacco Counseling Counseling given: Not Answered   Clinical Intake:  Pre-visit preparation completed: Yes  Pain : No/denies pain     Nutritional Status: BMI > 30  Obese Nutritional Risks: None Diabetes: Yes  How often do you need to have someone help you when you read instructions, pamphlets, or other written materials from your doctor or pharmacy?: 1 - Never  Diabetic? Yes Nutrition Risk Assessment:  Has the patient had any N/V/D within the last 2 months?  No  Does the patient have any non-healing wounds?  No  Has the patient had any unintentional weight loss or weight gain?  No   Diabetes:  Is the patient diabetic?  Yes  If diabetic, was a CBG obtained today?  No  Did the patient bring in  their glucometer from home?  No  How often do you monitor your CBG's? Twice daily.   Financial Strains and Diabetes Management:  Are you having any financial strains with the device, your supplies or your medication? No .  Does the patient want to be seen by Chronic Care Management for management of their diabetes?  No  Would the patient like to be referred to a Nutritionist or for Diabetic Management?  No   Diabetic Exams:  Diabetic Eye Exam: Overdue for diabetic eye exam. Pt has been advised about the importance in completing this exam. Patient advised to call and schedule an eye exam. Diabetic Foot Exam: Completed 11/20/2021   Interpreter Needed?: No  Information entered by ::  NAllen LPN   Activities of Daily Living    04/05/2022    4:31 PM  In your present state of health, do you have any difficulty performing the following activities:  Hearing? 0  Vision? 0  Difficulty concentrating or making decisions? 0  Walking or climbing stairs? 0  Dressing or bathing? 0  Doing errands, shopping? 0  Preparing Food and eating ? N  Using the Toilet? N  In the past six months, have you accidently leaked urine? N  Do you have problems with loss of bowel control? N  Managing your Medications? N  Managing your Finances? N  Housekeeping or managing your Housekeeping? N    Patient Care Team: Charlott Rakes, MD as PCP - General (Family Medicine)  Indicate any recent Medical Services you may have received from other than [facility] providers in the past year (date may be approximate).     Assessment:   This is a routine wellness examination for [place].  Hearing/Vision screen Vision Screening - Comments:: Regular eye exams, [facility]  Dietary issues and exercise activities discussed: Current Exercise Habits: Home exercise routine, Type of exercise: calisthenics;strength training/weights, Time (Minutes): > 60, Frequency (Times/Week): 3, Weekly Exercise (Minutes/Week): 0   Goals Addressed              This Visit's Progress    Patient Stated       04/05/2022, trying to get healthy       Depression Screen    04/05/2022    4:31 PM 11/20/2021    2:28 PM 07/11/2020    3:28 PM 04/06/2020    1:39 PM 09/22/2019    9:46 AM 05/25/2019    2:24 PM 07/10/2018   10:45 AM  PHQ 2/9 Scores  PHQ - 2 Score 0 0 0 0 0 0 0  PHQ- 9 Score  0 0 0 0 1 0    Fall Risk    04/05/2022    4:31 PM 11/20/2021    2:28 PM 04/06/2020    1:37 PM 11/23/2019    8:22 AM 09/22/2019    9:40 AM  [place] in the past year? 0 0 0 0 0  Number falls in past yr: 0 0 0    Injury with Fall? 0 0 0    Risk for fall due to : Medication side effect No Fall Risks     Follow up Falls prevention discussed;Education provided;Falls evaluation completed        FALL RISK PREVENTION PERTAINING TO THE HOME:  Any stairs in or around the home? No  If so, are there any without handrails? N/a Home free of loose throw rugs in walkways, pet beds, electrical cords, etc? Yes  Adequate lighting in your home to reduce risk of falls? Yes   ASSISTIVE DEVICES UTILIZED TO PREVENT FALLS:  Life alert? No  Use of a cane, walker or w/c? No  Grab bars in the bathroom? No  Shower chair or bench in shower? No  Elevated toilet seat or a handicapped toilet? No   TIMED UP AND GO:  Was the test performed? No .      Cognitive Function:        04/05/2022    4:32 PM  6CIT Screen  What Year? 0 points  What month? 0 points  What time? 0 points  Count back from 20 0 points  Months in reverse 0 points  Repeat phrase 4 points  Total Score 4 points  Immunizations Immunization History  Administered Date(s) Administered   Influenza,inj,Quad PF,6+ Mos 09/24/2017, 02/04/2019, 09/22/2019, 11/20/2021   Pneumococcal Polysaccharide-23 02/04/2019   Tdap 05/22/2016    TDAP status: Up to date  Flu Vaccine status: Up to date  Pneumococcal vaccine status: Up to date  Covid-19 vaccine status: Information provided on how to  obtain vaccines.   Qualifies for Shingles Vaccine? Yes   Zostavax completed No   Shingrix Completed?: No.    Education has been provided regarding the importance of this vaccine. Patient has been advised to call insurance company to determine out of pocket expense if they have not yet received this vaccine. Advised may also receive vaccine at local pharmacy or Health Dept. Verbalized acceptance and understanding.  Screening Tests Health Maintenance  Topic Date Due   Medicare Annual Wellness (AWV)  Never done   COVID-19 Vaccine (1) Never done   Zoster Vaccines- Shingrix (1 of 2) Never done   OPHTHALMOLOGY EXAM  01/29/2019   HEMOGLOBIN A1C  05/22/2022   Diabetic kidney evaluation - eGFR measurement  10/14/2022   Diabetic kidney evaluation - Urine ACR  10/14/2022   FOOT EXAM  11/21/2022   DTaP/Tdap/Td (2 - Td or Tdap) 05/23/2026   COLONOSCOPY (Pts 45-57yrs Insurance coverage will need to be confirmed)  01/29/2028   INFLUENZA VACCINE  Completed   Hepatitis C Screening  Completed   HIV Screening  Completed   HPV VACCINES  Aged Out    Health Maintenance  Health Maintenance Due  Topic Date Due   Medicare Annual Wellness (AWV)  Never done   COVID-19 Vaccine (1) Never done   Zoster Vaccines- Shingrix (1 of 2) Never done   OPHTHALMOLOGY EXAM  01/29/2019    Colorectal cancer screening: Type of screening: Colonoscopy. Completed 01/28/2018. Repeat every 10 years  Lung Cancer Screening: (Low Dose CT Chest recommended if Age 23-80 years, 30 pack-year currently smoking OR have quit w/in 15years.) does not qualify.   Lung Cancer Screening Referral: no  Additional Screening:  Hepatitis C Screening: does qualify; Completed 05/22/2016  Vision Screening: Recommended annual ophthalmology exams for early detection of glaucoma and other disorders of the eye. Is the patient up to date with their annual eye exam?  No  Who is the provider or what is the name of the office in which the patient  attends annual eye exams? [place] If pt is not established with a provider, would they like to be referred to a provider to establish care? No .   Dental Screening: Recommended annual dental exams for proper oral hygiene  Community Resource Referral / Chronic Care Management: CRR required this visit?  No   CCM required this visit?  No      Plan:     I have personally reviewed and noted the following in the patient's chart:   Medical and social history Use of alcohol, tobacco or illicit drugs  Current medications and supplements including opioid prescriptions. Patient is not currently taking opioid prescriptions. Functional ability and status Nutritional status Physical activity Advanced directives List of other physicians Hospitalizations, surgeries, and ER visits in previous 12 months Vitals Screenings to include cognitive, depression, and falls Referrals and appointments  In addition, I have reviewed and discussed with patient certain preventive protocols, quality metrics, and best practice recommendations. A written personalized care plan for preventive services as well as general preventive health recommendations were provided to patient.     Kellie Simmering, LPN   [id number]   Nurse Notes:  none  Due to this being a virtual visit, the after visit summary with patients personalized plan was offered to patient via mail or my-chart. Patient would like to access on my-chart

## 2022-04-10 ENCOUNTER — Ambulatory Visit: Payer: Medicare Other | Admitting: Family Medicine

## 2022-04-12 ENCOUNTER — Ambulatory Visit: Payer: Medicare Other | Admitting: Family Medicine

## 2022-05-15 ENCOUNTER — Ambulatory Visit: Payer: Medicare Other | Admitting: Family Medicine

## 2022-05-26 ENCOUNTER — Other Ambulatory Visit: Payer: Self-pay | Admitting: Orthopaedic Surgery

## 2022-06-01 ENCOUNTER — Telehealth: Payer: Self-pay

## 2022-06-01 NOTE — Telephone Encounter (Signed)
   Telephone encounter was:  Unsuccessful.  06/01/2022 Name: Brandon Finley MRN: [id number] DOB: Dec 20, 1960  Unsuccessful outbound call made today to assist with:   Colorectal Cancer Screening  Outreach Attempt:  1st Attempt  A HIPAA compliant voice message was left requesting a return call.  Instructed patient to call back   Lenard Forth [facility] Guide, [facility] [phone] [address] [place], [place], [place] [address] Phone: [phone] Email: [email or web address]

## 2022-06-04 ENCOUNTER — Telehealth: Payer: Self-pay

## 2022-06-04 NOTE — Telephone Encounter (Signed)
   Telephone encounter was:  Unsuccessful.  06/04/2022 Name: Brandon Finley MRN: [id number] DOB: [birth year]  Unsuccessful outbound call made today to assist with:   Colorectal Cancer Screening  Outreach Attempt:  2nd Attempt  A HIPAA compliant voice message was left requesting a return call.  Instructed patient to call back .    Lenard Forth [facility] Guide, [facility] [phone] [address] [place], [place], [place] [address] Phone: [phone] Email: [email or web address]

## 2022-06-08 ENCOUNTER — Telehealth: Payer: Self-pay

## 2022-06-08 NOTE — Telephone Encounter (Signed)
Pt was called an informed that he is not due for a colonoscopy at this time.

## 2022-06-08 NOTE — Telephone Encounter (Signed)
Copied from CRM [id number]. Topic: Appointment Scheduling - Scheduling Inquiry for Clinic >> Jun 08, 2022 10:05 AM [facility] F wrote: Reason for CRM: Pt is calling in requesting to be scheduled for a colonoscopy. Please follow up with pt.

## 2022-06-08 NOTE — Telephone Encounter (Signed)
   Telephone encounter was:  Successful.  06/08/2022 Name: Brandon Finley MRN: [id number] DOB: 08/17/1960  Brandon Finley is a 62 y.o. year old male who is a primary care patient of Hoy Register, MD . The community resource team was consulted for assistance with  Colorectal Cancer Screening  Care guide performed the following interventions: Patient provided with information about care guide support team and interviewed to confirm resource needs. Patient will be calling into Dr. Baxter Flattery team, [facility] to make an appointment for cancer screening   Follow Up Plan:  No further follow up planned at this time. The patient has been provided with needed resources.    Lenard Forth [facility] Guide, [facility] [phone] [address] [place], [place], [place] [address] Phone: [phone] Email: [email or web address]

## 2022-06-19 ENCOUNTER — Ambulatory Visit: Payer: 59 | Admitting: Podiatry

## 2022-08-02 ENCOUNTER — Encounter: Payer: Self-pay | Admitting: Podiatry

## 2022-08-02 ENCOUNTER — Ambulatory Visit (INDEPENDENT_AMBULATORY_CARE_PROVIDER_SITE_OTHER): Payer: 59 | Admitting: Podiatry

## 2022-08-02 DIAGNOSIS — B351 Tinea unguium: Secondary | ICD-10-CM

## 2022-08-02 DIAGNOSIS — M79674 Pain in right toe(s): Secondary | ICD-10-CM | POA: Diagnosis not present

## 2022-08-02 DIAGNOSIS — M79675 Pain in left toe(s): Secondary | ICD-10-CM | POA: Diagnosis not present

## 2022-08-02 DIAGNOSIS — E119 Type 2 diabetes mellitus without complications: Secondary | ICD-10-CM | POA: Diagnosis not present

## 2022-08-02 NOTE — Progress Notes (Signed)
This patient returns to my office for at risk foot care.  This patient requires this care by a professional since this patient will be at risk due to having  Diabetes.   This patient is unable to cut nails himself since the patient cannot reach his nails.These nails are painful walking and wearing shoes.  This patient presents for at risk foot care today.   General Appearance  Alert, conversant and in no acute stress.  Vascular  Dorsalis pedis and posterior tibial  pulses are palpable  bilaterally.  Capillary return is within normal limits  bilaterally. Temperature is within normal limits  bilaterally.  Neurologic  Senn-Weinstein monofilament wire test diminished bilaterally. Muscle power within normal limits bilaterally.  Nails Thick disfigured discolored nails with subungual debris  from hallux to fifth toes bilaterally. No evidence of bacterial infection or drainage bilaterally.  Orthopedic  No limitations of motion  feet .  No crepitus or effusions noted.  No bony pathology or digital deformities noted.  HAV  B/L.  Skin  normotropic skin with no porokeratosis noted bilaterally.  No signs of infections or ulcers noted.     Onychomycosis  Pain in right toes  Pain in left toes  Consent was obtained for treatment procedures.   Mechanical debridement of nails 1-5  bilaterally performed with a nail nipper.  Filed with dremel without incident.     Return office visit  3 months                    Told patient to return for periodic foot care and evaluation due to potential at risk complications.   Gregory Mayer DPM  

## 2022-08-22 ENCOUNTER — Telehealth: Payer: Self-pay | Admitting: Podiatry

## 2022-08-22 NOTE — Telephone Encounter (Signed)
Patient came in today and wants to get diabetic shoes but he needs an order in from Dr. Stacie Acres to be able to get casted if you could advise and contact patient after.

## 2022-08-28 ENCOUNTER — Telehealth: Payer: Self-pay | Admitting: Podiatry

## 2022-08-28 NOTE — Telephone Encounter (Signed)
Pt called and came in and was trying to get scheduled for  diabetic shoes and was told someone would call him and he has not heard anything.  Looked in note and nothing was noted that pt was to get diabetic shoes.   Could you please confirm he is ok to get diabetic shoes and I can get him scheduled.

## 2022-08-31 ENCOUNTER — Ambulatory Visit: Payer: 59

## 2022-08-31 NOTE — Progress Notes (Signed)
Patient presents to the office today for diabetic shoe and insole measuring.  Patient was measured with brannock device to determine size and width for 1 pair of extra depth shoes and foam casted for 3 pair of insoles.   Documentation of medical necessity will be sent to patient's treating diabetic doctor to verify and sign.   Patient's diabetic provider: Hoy Register   Shoes and insoles will be ordered at that time and patient will be notified for an appointment for fitting when they arrive.  Shoe size (per patient): 12-13 Brannock measurement: 12.5 Patient shoe selection- Shoe choice:   [id number] Shoe size ordered: 13WD 2nd [id number]  ABN and Financials signed  Addison Bailey Cped, CFo, CFm

## 2022-09-19 ENCOUNTER — Telehealth: Payer: Self-pay | Admitting: Family Medicine

## 2022-09-19 ENCOUNTER — Other Ambulatory Visit: Payer: Self-pay

## 2022-09-19 ENCOUNTER — Ambulatory Visit: Payer: 59 | Attending: Family Medicine | Admitting: Family Medicine

## 2022-09-19 ENCOUNTER — Encounter: Payer: Self-pay | Admitting: Family Medicine

## 2022-09-19 DIAGNOSIS — Z7984 Long term (current) use of oral hypoglycemic drugs: Secondary | ICD-10-CM

## 2022-09-19 DIAGNOSIS — Z794 Long term (current) use of insulin: Secondary | ICD-10-CM

## 2022-09-19 DIAGNOSIS — E1165 Type 2 diabetes mellitus with hyperglycemia: Secondary | ICD-10-CM

## 2022-09-19 DIAGNOSIS — Z7985 Long-term (current) use of injectable non-insulin antidiabetic drugs: Secondary | ICD-10-CM

## 2022-09-19 MED ORDER — ACCU-CHEK GUIDE W/DEVICE KIT
PACK | 0 refills | Status: AC
  Filled 2022-09-19: qty 1, fill #0
  Filled 2022-09-24: qty 1, 30d supply, fill #0

## 2022-09-19 MED ORDER — ACCU-CHEK GUIDE VI STRP
ORAL_STRIP | 12 refills | Status: AC
  Filled 2022-09-19: qty 100, 33d supply, fill #0

## 2022-09-19 NOTE — Progress Notes (Addendum)
Virtual Visit via Telephone Note  I connected with Brandon Finley, on 09/19/2022 at 1:31 PM by telephone and verified that I am speaking with the correct person using two identifiers.   Consent: I discussed the limitations, risks, security and privacy concerns of performing an evaluation and management service by telephone and the availability of in person appointments. I also discussed with the patient that there may be a patient responsible charge related to this service. The patient expressed understanding and agreed to proceed.   Location of Patient: Home  Location of Provider: Clinic   Persons participating in Telemedicine visit: Maverik Bueti [facility] Dr. Alvis Lemmings     History of Present Illness: Brandon Finley is a 62 y.o. 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    Discussed the use of AI scribe software for clinical note transcription with the patient, who gave verbal consent to proceed.  The patient, with a history of diabetes, has not had a visit since November of the previous year. He has been dealing with personal issues, including the death of his father and moving to a new address. He has been working and going to [employer], resulting in weight loss. He has not been checking his blood sugars at home and cannot locate his glucometer. He is currently taking glipizide, Lantus, and Ozempic for diabetes management, and atorvastatin for cholesterol. He also reports hip pain.  He is in need of Diabetic shoes and saw Podiatry in 03/2022.     Past Medical History:  Diagnosis Date   Arthritis    Hypertension    Allergies  Allergen Reactions   No Known Allergies     Current Outpatient Medications on File Prior to Visit  Medication Sig Dispense Refill   Accu-Chek Softclix Lancets lancets Use as instructed 100 each 12   Accu-Chek  Softclix Lancets lancets Use as instructed 3 times daily before meals 100 each 12   aspirin EC 81 MG tablet Take 1 tablet (81 mg total) by mouth daily. 90 tablet 1   Blood Glucose Monitoring Suppl (ACCU-CHEK GUIDE) w/Device KIT use to Test blood sugar  3 times daily 1 kit 0   cetirizine (ZYRTEC) 10 MG tablet Take 1 tablet (10 mg total) by mouth daily. 30 tablet 1   glipiZIDE (GLUCOTROL) 10 MG tablet TAKE 1 TABLET (10 MG TOTAL) BY MOUTH 2 (TWO) TIMES DAILY BEFORE A MEAL. 180 tablet 1   glucose blood (ACCU-CHEK GUIDE) test strip Use as instructed 100 each 12   insulin glargine (LANTUS) 100 UNIT/ML Solostar Pen Inject 10 Units into the skin daily. 30 mL 6   Insulin Pen Needle (TRUEPLUS PEN NEEDLES) 32G X 4 MM MISC Use to inject insulin and Victoza twice daily 100 each 6   lidocaine (LIDODERM) 5 % Place 1 patch onto the skin daily. Remove & Discard patch within 12 hours or as directed by MD 30 patch 0   lisinopril-hydrochlorothiazide (ZESTORETIC) 10-12.5 MG tablet Take 1 tablet by mouth every day 30 tablet 3   methocarbamol (ROBAXIN) 500 MG tablet Take 2 tablets (1,000 mg total) by mouth 2 (two) times daily. 120 tablet 2   nabumetone (RELAFEN) 750 MG tablet Take 1 tablet by mouth twice daily as needed 60 tablet 11   PEG 3350-KCl-Na Bicarb-NaCl (GAVILYTE-N WITH FLAVOR PACK PO)      rosuvastatin (CRESTOR) 20 MG tablet TAKE 1 TABLET (  20 MG TOTAL) BY MOUTH DAILY. 90 tablet 1   Semaglutide,0.25 or 0.5MG /DOS, (OZEMPIC, 0.25 OR 0.5 MG/DOSE,) 2 MG/3ML SOPN Inject 0.5 mg into the skin once a week. 3 mL 1   traMADol (ULTRAM) 50 MG tablet Take 2 tablets (100 mg total) by mouth every 12 (twelve) hours as needed. 30 tablet 0   [DISCONTINUED] atorvastatin (LIPITOR) 80 MG tablet Take 1 tablet (80 mg total) by mouth daily. 90 tablet 1   No current facility-administered medications on file prior to visit.    ROS: See HPI  Observations/Objective: Awake, alert, oriented x3 Not in acute distress Normal  mood      Latest Ref Rng & Units 10/13/2021    9:43 AM 04/06/2020    2:22 PM 04/29/2019    9:50 AM  CMP  Glucose 70 - 99 mg/dL 191  478  295   BUN 8 - 27 mg/dL 6  8  6    Creatinine 0.76 - 1.27 mg/dL 6.21  3.08  6.57   Sodium 134 - 144 mmol/L 136  138  135   Potassium 3.5 - 5.2 mmol/L 4.2  4.3  4.3   Chloride 96 - 106 mmol/L 97  99  97   CO2 20 - 29 mmol/L 23  24  23    Calcium 8.6 - 10.2 mg/dL 84.6  9.7  96.2   Total Protein 6.0 - 8.5 g/dL 7.9  8.1  8.1   Total Bilirubin 0.0 - 1.2 mg/dL 0.5  0.4  0.5   Alkaline Phos 44 - 121 IU/L 120  93  117   AST 0 - 40 IU/L 57  50  29   ALT 0 - 44 IU/L 58  56  55     Lipid Panel     Component Value Date/Time   CHOL 220 (H) 10/13/2021 0943   TRIG 230 (H) 10/13/2021 0943   HDL 38 (L) 10/13/2021 0943   CHOLHDL 6.3 (H) 04/06/2020 1422   CHOLHDL 6.5 07/25/2012 1047   VLDL 16 07/25/2012 1047   LDLCALC 140 (H) 10/13/2021 0943   LABVLDL 42 (H) 10/13/2021 0943    Lab Results  Component Value Date   HGBA1C 10.7 (A) 11/21/2021    Assessment and Plan:     Type 2 Diabetes Mellitus Patient has not been in for a visit since November of the previous year. Currently on Glipizide, Lantus, and Ozempic. No recent A1c or blood glucose monitoring at home. -Order labs for A1c, kidney and liver function on 09/25/2022. -Will adjust regimen after A1c is obtained -Resend prescription for glucometer to pharmacy. -Continue current medications:Glipizide, Lantus 10 units daily, Ozempic once weekly. -Will complete form for Diabetic shoes -Continue to follow up with Podiatry -Labs ordered against 09/25/22  Hyperlipidemia Patient on Atorvastatin. Unable to check cholesterol due to patient's inability to come in for fasting labs. -Advise patient to pick up Atorvastatin refill from pharmacy.  Hip Pain Patient requests Tramadol for hip pain. -This was prescribed by Orthopedics -He will need to obtain refill from them   General Health Maintenance Patient  reports weight loss and increased gym activity. -Encourage continued healthy lifestyle changes.        Follow Up Instructions: 3 months   I discussed the assessment and treatment plan with the patient. The patient was provided an opportunity to ask questions and all were answered. The patient agreed with the plan and demonstrated an understanding of the instructions.   The patient was advised to call back or seek  an in-person evaluation if the symptoms worsen or if the condition fails to improve as anticipated.     I provided 14 minutes total of non-face-to-face time during this encounter.   Hoy Register, MD, FAAFP. [facility] [place], [place] [phone]   09/19/2022, 1:31 PM

## 2022-09-19 NOTE — Telephone Encounter (Signed)
Pt came in this afternoon was requesting refill on tramabol contact him when ready he said

## 2022-09-20 NOTE — Telephone Encounter (Signed)
Prescription was provided by Dr. Magnus Ivan.  He needs to contact Dr. Eliberto Ivory office.

## 2022-09-20 NOTE — Telephone Encounter (Signed)
Routing to PCP for review.

## 2022-09-20 NOTE — Telephone Encounter (Signed)
Pt has been informed.

## 2022-09-24 ENCOUNTER — Other Ambulatory Visit: Payer: Self-pay | Admitting: Orthopaedic Surgery

## 2022-09-24 ENCOUNTER — Ambulatory Visit: Payer: 59 | Attending: Family Medicine

## 2022-09-24 ENCOUNTER — Telehealth: Payer: Self-pay | Admitting: Orthopaedic Surgery

## 2022-09-24 ENCOUNTER — Other Ambulatory Visit: Payer: Self-pay

## 2022-09-24 DIAGNOSIS — E1165 Type 2 diabetes mellitus with hyperglycemia: Secondary | ICD-10-CM | POA: Diagnosis not present

## 2022-09-24 MED ORDER — TRAMADOL HCL 50 MG PO TABS
100.0000 mg | ORAL_TABLET | Freq: Two times a day (BID) | ORAL | 0 refills | Status: AC | PRN
Start: 2022-09-24 — End: ?
  Filled 2022-09-24: qty 28, 7d supply, fill #0

## 2022-09-24 NOTE — Telephone Encounter (Signed)
Pt came into the office stating his L hip pain has worsen. He was see 1/10 by Dr. Magnus Ivan about this matter. Pt asked if you could refill his tramadol 100 mg for his pain. Pt advised to give him a call please. Megh Pizzini [phone]

## 2022-09-25 ENCOUNTER — Other Ambulatory Visit: Payer: 59

## 2022-09-25 ENCOUNTER — Other Ambulatory Visit: Payer: Self-pay | Admitting: Family Medicine

## 2022-09-25 ENCOUNTER — Other Ambulatory Visit: Payer: Self-pay

## 2022-09-25 LAB — HEMOGLOBIN A1C
Est. average glucose Bld gHb Est-mCnc: 194 mg/dL
Hgb A1c MFr Bld: 8.4 % — ABNORMAL HIGH (ref 4.8–5.6)

## 2022-09-25 LAB — CMP14+EGFR
ALT: 25 IU/L (ref 0–44)
AST: 20 IU/L (ref 0–40)
Albumin: 4.4 g/dL (ref 3.9–4.9)
Alkaline Phosphatase: 102 IU/L (ref 44–121)
BUN/Creatinine Ratio: 16 (ref 10–24)
BUN: 11 mg/dL (ref 8–27)
Bilirubin Total: 0.5 mg/dL (ref 0.0–1.2)
CO2: 24 mmol/L (ref 20–29)
Calcium: 10.1 mg/dL (ref 8.6–10.2)
Chloride: 100 mmol/L (ref 96–106)
Creatinine, Ser: 0.7 mg/dL — ABNORMAL LOW (ref 0.76–1.27)
Globulin, Total: 3.5 g/dL (ref 1.5–4.5)
Glucose: 209 mg/dL — ABNORMAL HIGH (ref 70–99)
Potassium: 4.4 mmol/L (ref 3.5–5.2)
Sodium: 140 mmol/L (ref 134–144)
Total Protein: 7.9 g/dL (ref 6.0–8.5)
eGFR: 105 mL/min/{1.73_m2} (ref >59)

## 2022-09-25 LAB — MICROALBUMIN / CREATININE URINE RATIO
Creatinine, Urine: 252.4 mg/dL
Microalb/Creat Ratio: 8 mg/g{creat} (ref 0–29)
Microalbumin, Urine: 21.2 ug/mL

## 2022-09-25 MED ORDER — SEMAGLUTIDE (1 MG/DOSE) 4 MG/3ML ~~LOC~~ SOPN
1.0000 mg | PEN_INJECTOR | SUBCUTANEOUS | 3 refills | Status: AC
  Filled 2022-09-25: qty 3, 28d supply, fill #0
  Filled 2022-10-23: qty 3, 28d supply, fill #1

## 2022-09-28 ENCOUNTER — Other Ambulatory Visit: Payer: Self-pay

## 2022-10-17 DIAGNOSIS — E119 Type 2 diabetes mellitus without complications: Secondary | ICD-10-CM | POA: Diagnosis not present

## 2022-10-17 DIAGNOSIS — H25813 Combined forms of age-related cataract, bilateral: Secondary | ICD-10-CM | POA: Diagnosis not present

## 2022-10-17 DIAGNOSIS — H524 Presbyopia: Secondary | ICD-10-CM | POA: Diagnosis not present

## 2022-10-17 DIAGNOSIS — H52223 Regular astigmatism, bilateral: Secondary | ICD-10-CM | POA: Diagnosis not present

## 2022-10-23 ENCOUNTER — Other Ambulatory Visit: Payer: Self-pay

## 2022-10-24 ENCOUNTER — Telehealth: Payer: Self-pay

## 2022-10-24 NOTE — Telephone Encounter (Signed)
Copied from CRM [id number]. Topic: General - Other >> Oct 23, 2022  4:19 PM Santiya F wrote: Reason for CRM: Pt is calling in wanting to know if his orthopedic shoes have arrived at the office.

## 2022-10-25 NOTE — Telephone Encounter (Signed)
Pt has been called and informed to contact [facility] to discuss.

## 2022-10-26 ENCOUNTER — Telehealth: Payer: Self-pay | Admitting: Podiatry

## 2022-10-26 ENCOUNTER — Telehealth: Payer: Self-pay | Admitting: Family Medicine

## 2022-10-26 NOTE — Telephone Encounter (Signed)
Pt called checking on status of diabetic shoes.  I checked safestep and we have not gotten paperwork from pcp.   I advised him to call the pcp to get them to sign the paperwork.

## 2022-10-26 NOTE — Telephone Encounter (Signed)
Pt is calling in because [facility] have his orthopedic shoes and they need Dr. Alvis Lemmings to send paperwork over so he can get his shoes. Asked pt what type of paperwork was needed and pt stated he believes it's paperwork stating it's okay for him to get the shoes and why he needs them. If there are any questions please follow up with pt.

## 2022-10-26 NOTE — Telephone Encounter (Signed)
Form was faxed in September when patient had virtual visit to discuss the need. Form has been re faxed today again.

## 2022-11-07 ENCOUNTER — Ambulatory Visit: Payer: 59 | Admitting: Podiatry

## 2022-11-22 ENCOUNTER — Ambulatory Visit (INDEPENDENT_AMBULATORY_CARE_PROVIDER_SITE_OTHER): Payer: 59 | Admitting: Podiatry

## 2022-11-22 ENCOUNTER — Encounter: Payer: Self-pay | Admitting: Podiatry

## 2022-11-22 DIAGNOSIS — E119 Type 2 diabetes mellitus without complications: Secondary | ICD-10-CM | POA: Diagnosis not present

## 2022-11-22 DIAGNOSIS — B351 Tinea unguium: Secondary | ICD-10-CM | POA: Diagnosis not present

## 2022-11-22 DIAGNOSIS — M79675 Pain in left toe(s): Secondary | ICD-10-CM

## 2022-11-22 DIAGNOSIS — M79674 Pain in right toe(s): Secondary | ICD-10-CM

## 2022-11-22 NOTE — Progress Notes (Signed)
This patient returns to my office for at risk foot care.  This patient requires this care by a professional since this patient will be at risk due to having  Diabetes.   This patient is unable to cut nails himself since the patient cannot reach his nails.These nails are painful walking and wearing shoes.  This patient presents for at risk foot care today.   General Appearance  Alert, conversant and in no acute stress.  Vascular  Dorsalis pedis and posterior tibial  pulses are palpable  bilaterally.  Capillary return is within normal limits  bilaterally. Temperature is within normal limits  bilaterally.  Neurologic  Senn-Weinstein monofilament wire test diminished bilaterally. Muscle power within normal limits bilaterally.  Nails Thick disfigured discolored nails with subungual debris  from hallux to fifth toes bilaterally. No evidence of bacterial infection or drainage bilaterally.  Orthopedic  No limitations of motion  feet .  No crepitus or effusions noted.  No bony pathology or digital deformities noted.  HAV  B/L.  Skin  normotropic skin with no porokeratosis noted bilaterally.  No signs of infections or ulcers noted.     Onychomycosis  Pain in right toes  Pain in left toes  Consent was obtained for treatment procedures.   Mechanical debridement of nails 1-5  bilaterally performed with a nail nipper.  Filed with dremel without incident.     Return office visit  3 months                    Told patient to return for periodic foot care and evaluation due to potential at risk complications.   Irvine Glorioso DPM  

## 2022-12-03 ENCOUNTER — Other Ambulatory Visit: Payer: Self-pay | Admitting: Family Medicine

## 2022-12-03 ENCOUNTER — Other Ambulatory Visit: Payer: Self-pay

## 2022-12-03 DIAGNOSIS — E1159 Type 2 diabetes mellitus with other circulatory complications: Secondary | ICD-10-CM

## 2022-12-03 NOTE — Telephone Encounter (Signed)
Requested Prescriptions  Pending Prescriptions Disp Refills   lisinopril-hydrochlorothiazide (ZESTORETIC) 10-12.5 MG tablet [Pharmacy Med Name: Lisinopril/Hctz 10-12.5mg  Tablet] 90 tablet 1    Sig: Take 1 tablet by mouth every day     Cardiovascular:  ACEI + Diuretic Combos Failed - 12/03/2022  1:09 AM      Failed - Cr in normal range and within 180 days    Creat  Date Value Ref Range Status  07/25/2012 0.82 0.50 - 1.35 mg/dL Final   Creatinine, Ser  Date Value Ref Range Status  09/24/2022 0.70 (L) 0.76 - 1.27 mg/dL Final         Passed - Na in normal range and within 180 days    Sodium  Date Value Ref Range Status  09/24/2022 140 134 - 144 mmol/L Final         Passed - K in normal range and within 180 days    Potassium  Date Value Ref Range Status  09/24/2022 4.4 3.5 - 5.2 mmol/L Final         Passed - eGFR is 30 or above and within 180 days    GFR, Est African American  Date Value Ref Range Status  07/25/2012 >89 mL/min Final   GFR calc Af Amer  Date Value Ref Range Status  04/29/2019 122 >59 mL/min/1.73 Final   GFR, Est Non African American  Date Value Ref Range Status  07/25/2012 >89 mL/min Final    Comment:      The estimated GFR is a calculation valid for adults (>=42 years old) that uses the CKD-EPI algorithm to adjust for age and sex. It is   not to be used for children, pregnant women, hospitalized patients,    patients on dialysis, or with rapidly changing kidney function. According to the NKDEP, eGFR >89 is normal, 60-89 shows mild impairment, 30-59 shows moderate impairment, 15-29 shows severe impairment and <15 is ESRD.     GFR calc non Af Amer  Date Value Ref Range Status  04/29/2019 105 >59 mL/min/1.73 Final   eGFR  Date Value Ref Range Status  09/24/2022 105 >59 mL/min/1.73 Final         Passed - Patient is not pregnant      Passed - Last BP in normal range    BP Readings from Last 1 Encounters:  03/19/22 130/81         Passed -  Valid encounter within last 6 months    Recent Outpatient Visits           2 months ago Type 2 diabetes mellitus with hyperglycemia, without long-term current use of insulin (HCC)   [facility] Dept Of [place]. [facility] Hoy Register, MD   1 year ago Type 2 diabetes mellitus with hyperglycemia, without long-term current use of insulin (HCC)   [facility] - A Dept Of [place]. [facility] Hoy Register, MD   1 year ago Type 2 diabetes mellitus with hyperglycemia, without long-term current use of insulin (HCC)   [facility] - A Dept Of [place]. [facility] Hoy Register, MD   2 years ago Type 2 diabetes mellitus with hyperglycemia, without long-term current use of insulin (HCC)   [facility] - A Dept Of [place]. [facility] Hoy Register, MD   2 years ago Type 2 diabetes mellitus with hyperglycemia, without long-term current use of insulin (HCC)  [facility] - A Dept Of [place]. [facility] Hoy Register, MD

## 2022-12-03 NOTE — Telephone Encounter (Unsigned)
Copied from CRM [id number]. Topic: General - Other >> Dec 03, 2022  3:52 PM Everette C wrote: Reason for CRM: Medication Refill - Most Recent Primary Care Visit:  Provider: [facility] Department: [facility] Visit Type: LAB Date: 09/24/2022  Medication: Rx #: [id number]  traMADol (ULTRAM) 50 MG tablet [[id number]]    Has the patient contacted their pharmacy? No (Agent: If no, request that the patient contact the pharmacy for the refill. If patient does not wish to contact the pharmacy document the reason why and proceed with request.) (Agent: If yes, when and what did the pharmacy advise?)  Is this the correct pharmacy for this prescription? Yes If no, delete pharmacy and type the correct one.  This is the patient's preferred pharmacy:  [facility] [address] [place] [address] Phone: [phone] Fax: [phone]     Has the prescription been filled recently? Yes  Is the patient out of the medication? Yes  Has the patient been seen for an appointment in the last year OR does the patient have an upcoming appointment? Yes  Can we respond through MyChart? No  Agent: Please be advised that Rx refills may take up to 3 business days. We ask that you follow-up with your pharmacy.

## 2022-12-04 NOTE — Telephone Encounter (Signed)
Requested medication (s) are due for refill today:   Provider to review  Requested medication (s) are on the active medication list:   Yes  Future visit scheduled:   No   Last ordered: 09/24/2022 #30, 0 refills  Non delegated refill    Requested Prescriptions  Pending Prescriptions Disp Refills   traMADol (ULTRAM) 50 MG tablet 30 tablet 0    Sig: Take 2 tablets (100 mg total) by mouth every 12 (twelve) hours as needed.     Not Delegated - Analgesics:  Opioid Agonists Failed - 12/03/2022  3:58 PM      Failed - This refill cannot be delegated      Failed - Urine Drug Screen completed in last 360 days      Passed - Valid encounter within last 3 months    Recent Outpatient Visits           2 months ago Type 2 diabetes mellitus with hyperglycemia, without long-term current use of insulin (HCC)   [facility] - [facility] Dept Of [place]. [facility] Hoy Register, MD   1 year ago Type 2 diabetes mellitus with hyperglycemia, without long-term current use of insulin (HCC)   [facility] - A Dept Of [place]. [facility] Hoy Register, MD   1 year ago Type 2 diabetes mellitus with hyperglycemia, without long-term current use of insulin (HCC)   [facility] - A Dept Of [place]. [facility] Hoy Register, MD   2 years ago Type 2 diabetes mellitus with hyperglycemia, without long-term current use of insulin (HCC)   [facility] - A Dept Of [place]. [facility] Hoy Register, MD   2 years ago Type 2 diabetes mellitus with hyperglycemia, without long-term current use of insulin (HCC)   [facility] [place] - A Dept Of [place]. [facility] Hoy Register, MD

## 2022-12-06 ENCOUNTER — Other Ambulatory Visit: Payer: Self-pay | Admitting: Family Medicine

## 2022-12-06 ENCOUNTER — Telehealth: Payer: Self-pay | Admitting: Orthopaedic Surgery

## 2022-12-06 NOTE — Telephone Encounter (Signed)
LMOM for patient of the below message from [place]  

## 2022-12-06 NOTE — Telephone Encounter (Signed)
Medication Refill -  Most Recent Primary Care Visit:  Provider: [facility]  Department: [facility]  Visit Type: LAB  Date: 09/24/2022  Medication: traMADol (ULTRAM) 50 MG tablet   Has the patient contacted their pharmacy? No (Agent: If no, request that the patient contact the pharmacy for the refill. If patient does not wish to contact the pharmacy document the reason why and proceed with request.) (Agent: If yes, when and what did the pharmacy advise?)  Is this the correct pharmacy for this prescription? Yes If no, delete pharmacy and type the correct one.  This is the patient's preferred pharmacy:  [facility] Phone: [phone]  Fax: [phone]      Has the prescription been filled recently? Yes  Is the patient out of the medication? Yes  Has the patient been seen for an appointment in the last year OR does the patient have an upcoming appointment? No  Can we respond through MyChart? Yes  Agent: Please be advised that Rx refills may take up to 3 business days. We ask that you follow-up with your pharmacy.

## 2022-12-06 NOTE — Telephone Encounter (Signed)
Patient called would like tramadol called in for him.

## 2022-12-07 NOTE — Telephone Encounter (Signed)
Requested medications are due for refill today.  yes  Requested medications are on the active medications list.  yes  Last refill. 09/24/2022 #30 0 rf  Future visit scheduled.   no  Notes to clinic.  Refill/refusal not delegated.    Requested Prescriptions  Pending Prescriptions Disp Refills   traMADol (ULTRAM) 50 MG tablet 30 tablet 0    Sig: Take 2 tablets (100 mg total) by mouth every 12 (twelve) hours as needed.     Not Delegated - Analgesics:  Opioid Agonists Failed - 12/06/2022  4:31 PM      Failed - This refill cannot be delegated      Failed - Urine Drug Screen completed in last 360 days      Passed - Valid encounter within last 3 months    Recent Outpatient Visits           2 months ago Type 2 diabetes mellitus with hyperglycemia, without long-term current use of insulin (HCC)   [facility] [facility] Dept Of [place]. [facility] Hoy Register, MD   1 year ago Type 2 diabetes mellitus with hyperglycemia, without long-term current use of insulin (HCC)   [facility] [place] [facility] - A Dept Of [place]. [facility] Hoy Register, MD   1 year ago Type 2 diabetes mellitus with hyperglycemia, without long-term current use of insulin (HCC)   [facility] - A Dept Of [place]. [facility] Hoy Register, MD   2 years ago Type 2 diabetes mellitus with hyperglycemia, without long-term current use of insulin (HCC)   [facility] [facility] [place] - [facility] Dept Of [place]. [facility] Hoy Register, MD   2 years ago Type 2 diabetes mellitus with hyperglycemia, without long-term current use of insulin (HCC)   [facility] [place] [facility] - A Dept Of [place]. [facility] Hoy Register, MD

## 2023-02-04 ENCOUNTER — Telehealth: Payer: Self-pay

## 2023-02-04 NOTE — Telephone Encounter (Signed)
Patient contacted to schedule appointment for Follow-up A1C. Unable reach message left on VM to return call.

## 2023-02-08 ENCOUNTER — Emergency Department (HOSPITAL_COMMUNITY): Payer: 59

## 2023-02-08 ENCOUNTER — Emergency Department (HOSPITAL_COMMUNITY)
Admission: EM | Admit: 2023-02-08 | Discharge: 2023-02-08 | Disposition: A | Discharge status: Home / Self Care | Payer: 59 | Attending: Emergency Medicine | Admitting: Emergency Medicine

## 2023-02-08 ENCOUNTER — Other Ambulatory Visit: Payer: Self-pay

## 2023-02-08 ENCOUNTER — Encounter (HOSPITAL_COMMUNITY): Payer: Self-pay

## 2023-02-08 DIAGNOSIS — Z794 Long term (current) use of insulin: Secondary | ICD-10-CM | POA: Insufficient documentation

## 2023-02-08 DIAGNOSIS — Z96641 Presence of right artificial hip joint: Secondary | ICD-10-CM | POA: Diagnosis not present

## 2023-02-08 DIAGNOSIS — Z7984 Long term (current) use of oral hypoglycemic drugs: Secondary | ICD-10-CM | POA: Insufficient documentation

## 2023-02-08 DIAGNOSIS — I10 Essential (primary) hypertension: Secondary | ICD-10-CM | POA: Insufficient documentation

## 2023-02-08 DIAGNOSIS — E119 Type 2 diabetes mellitus without complications: Secondary | ICD-10-CM | POA: Insufficient documentation

## 2023-02-08 DIAGNOSIS — Z79899 Other long term (current) drug therapy: Secondary | ICD-10-CM | POA: Diagnosis not present

## 2023-02-08 DIAGNOSIS — M25552 Pain in left hip: Secondary | ICD-10-CM | POA: Diagnosis present

## 2023-02-08 DIAGNOSIS — Z7982 Long term (current) use of aspirin: Secondary | ICD-10-CM | POA: Insufficient documentation

## 2023-02-08 MED ORDER — MELOXICAM 15 MG PO TABS
15.0000 mg | ORAL_TABLET | Freq: Every day | ORAL | 0 refills | Status: AC
  Filled 2023-02-08: qty 7, 7d supply, fill #0

## 2023-02-08 MED ORDER — KETOROLAC TROMETHAMINE 30 MG/ML IJ SOLN
30.0000 mg | Freq: Once | INTRAMUSCULAR | Status: AC
  Administered 2023-02-08: 30 mg via INTRAMUSCULAR
  Filled 2023-02-08: qty 1

## 2023-02-08 NOTE — ED Triage Notes (Signed)
Pt came to ED for left hip pain that started Tuesday. Denies recent falls.

## 2023-02-08 NOTE — ED Provider Notes (Signed)
[place] EMERGENCY DEPARTMENT AT [facility] Provider Note   CSN: [id number] Arrival date & time: 02/08/23  1107     History  Chief Complaint  Patient presents with   Hip Pain    SERGI GELLNER is a 63 y.o. male.  64 year old male with a history of hip arthritis s/p right hip arthroplasty, type 2 diabetes, hypertension, hyperlipidemia presenting due to 3 to 4-day history of nonradiating left hip pain.  Denies any recent injuries or falls, denies numbness/tingling/weakness in his lower extremities.  Pain improves with rest but is 7/10 with movement or ambulation.  He has not tried any OTC medications such as ibuprofen or Tylenol.  Has previously seen Dr. Magnus Ivan with orthopedics.  Patient has followed with him in the past for his hip arthritis and is following with his PCP to better control his diabetes in case he eventually needs a surgery done.   Hip Pain Pertinent negatives include no chest pain, no abdominal pain, no headaches and no shortness of breath.       Home Medications Prior to Admission medications   Medication Sig Start Date End Date Taking? Authorizing Provider  Accu-Chek Softclix Lancets lancets Use as instructed 07/11/20   Hoy Register, MD  Accu-Chek Softclix Lancets lancets Use as instructed 3 times daily before meals 11/20/21   Hoy Register, MD  aspirin EC 81 MG tablet Take 1 tablet (81 mg total) by mouth daily. 06/05/17   Hoy Register, MD  Blood Glucose Monitoring Suppl (ACCU-CHEK GUIDE) w/Device KIT use to Test blood sugar  3 times daily 09/19/22   Hoy Register, MD  cetirizine (ZYRTEC) 10 MG tablet Take 1 tablet (10 mg total) by mouth daily. 09/22/19   Hoy Register, MD  glipiZIDE (GLUCOTROL) 10 MG tablet TAKE 1 TABLET (10 MG TOTAL) BY MOUTH 2 (TWO) TIMES DAILY BEFORE A MEAL. 04/06/20 04/06/21  Hoy Register, MD  glucose blood (ACCU-CHEK GUIDE) test strip Use as instructed 09/19/22   Hoy Register, MD  insulin glargine (LANTUS) 100  UNIT/ML Solostar Pen Inject 10 Units into the skin daily. 11/20/21   Hoy Register, MD  Insulin Pen Needle (TRUEPLUS PEN NEEDLES) 32G X 4 MM MISC Use to inject insulin and Victoza twice daily 09/22/19   Hoy Register, MD  lidocaine (LIDODERM) 5 % Place 1 patch onto the skin daily. Remove & Discard patch within 12 hours or as directed by MD 03/12/19   Caccavale, Sophia, PA-C  lisinopril-hydrochlorothiazide (ZESTORETIC) 10-12.5 MG tablet Take 1 tablet by mouth every day 12/03/22   Hoy Register, MD  methocarbamol (ROBAXIN) 500 MG tablet Take 2 tablets (1,000 mg total) by mouth 2 (two) times daily. 03/04/19   Hoy Register, MD  nabumetone (RELAFEN) 750 MG tablet Take 1 tablet by mouth twice daily as needed 05/28/22   Kathryne Hitch, MD  PEG 3350-KCl-Na Bicarb-NaCl (GAVILYTE-N WITH FLAVOR PACK PO)  06/19/18   [provider]  rosuvastatin (CRESTOR) 20 MG tablet TAKE 1 TABLET (20 MG TOTAL) BY MOUTH DAILY. 10/09/21 10/09/22  Hoy Register, MD  Semaglutide, 1 MG/DOSE, 4 MG/3ML SOPN Inject 1 mg as directed once a week. 09/25/22   Hoy Register, MD  traMADol (ULTRAM) 50 MG tablet Take 2 tablets (100 mg total) by mouth every 12 (twelve) hours as needed. 09/24/22   Kathryne Hitch, MD  atorvastatin (LIPITOR) 80 MG tablet Take 1 tablet (80 mg total) by mouth daily. 04/06/20 04/07/20  Hoy Register, MD      Allergies    No  known allergies    Review of Systems   Review of Systems  Constitutional:  Negative for fever.  Respiratory:  Negative for shortness of breath.   Cardiovascular:  Negative for chest pain.  Gastrointestinal:  Negative for abdominal pain, nausea and vomiting.  Genitourinary:  Negative for dysuria and hematuria.  Musculoskeletal:  Negative for back pain.  Neurological:  Negative for weakness and headaches.    Physical Exam Updated Vital Signs BP (!) 154/94 (BP Location: Right Arm)   Pulse 82   Temp 98.3 F (36.8 C)   Resp 15   Ht 6\' 1"  (1.854 m)   Wt  130.2 kg   SpO2 97%   BMI 37.87 kg/m  Physical Exam Constitutional:      General: He is not in acute distress.    Appearance: Normal appearance.  HENT:     Head: Normocephalic and atraumatic.     Mouth/Throat:     Mouth: Mucous membranes are moist.     Pharynx: Oropharynx is clear.  Eyes:     Extraocular Movements: Extraocular movements intact.     Pupils: Pupils are equal, round, and reactive to light.  Cardiovascular:     Rate and Rhythm: Normal rate and regular rhythm.     Heart sounds: Normal heart sounds. No murmur heard. Pulmonary:     Effort: Pulmonary effort is normal. No respiratory distress.     Breath sounds: Normal breath sounds.  Abdominal:     General: Abdomen is flat. There is no distension.     Palpations: Abdomen is soft.     Tenderness: There is no abdominal tenderness.  Musculoskeletal:        General: No signs of injury.     Right hip: Normal.     Left hip: Tenderness present.     Right knee: Normal.     Left knee: Normal.     Right lower leg: No edema.     Left lower leg: No edema.     Comments: Left hip: Tender to palpation over greater trochanter.  Normal ROM and strength with flexion and extension.  Pain with FABER/FADIR.  NVI distally with 2+ DP pulse and intact strength/sensation  Neurological:     General: No focal deficit present.     Mental Status: He is alert. Mental status is at baseline.     Sensory: No sensory deficit.     Motor: No weakness.     ED Results / Procedures / Treatments   Labs (all labs ordered are listed, but only abnormal results are displayed) Labs Reviewed - No data to display  EKG None  Radiology DG Hip Unilat W or Wo Pelvis 2-3 Views Left Result Date: 02/08/2023 CLINICAL DATA:  63 year old male with persistent left hip pain for 1 week. No known injury. EXAM: DG HIP (WITH OR WITHOUT PELVIS) 2-3V LEFT COMPARISON:  Left hip series 01/24/2022. FINDINGS: AP pelvis and two views of the left hip 1158 hours. Right hip  bipolar arthroplasty partially visible again today. Normal background bone mineralization. Pelvis appears stable and intact. SI joints remain symmetric. Pelvic phleboliths. Nonobstructed bowel-gas pattern. Proximal left femur remarkable for some joint space loss, acetabular spurring, subchondral cysts. But no acute fracture or dislocation. No acute osseous abnormality identified. IMPRESSION: Left hip joint degeneration with no acute osseous abnormality identified. Bipolar right hip arthroplasty. Electronically Signed   By: Odessa Fleming M.D.   On: 02/08/2023 12:28    Procedures Procedures    Medications Ordered in ED Medications  ketorolac (TORADOL) 30 MG/ML injection 30 mg (has no administration in time range)    ED Course/ Medical Decision Making/ A&P                                 Medical Decision Making 63 year old male with history of hip arthritis s/p right hip arthroplasty, diabetes, hypertension, hyperlipidemia presenting due to 3 to 4-day history of left hip pain without recent injury.  X-ray shows degenerative joint disease consistent with his known arthritis. Suspect his pain is 2/2 arthritis vs greater trochanteric pain syndrome.  Exam is otherwise benign and he is neurovascularly intact distal to his pain.  Given these findings as well as point tenderness over the greater trochanter and pain with FABER/FADIR low suspicion that this pain is caused by other etiologies such as muscle strain, nephrolithiasis, hernia.  Will treat with IM Toradol and encourage daily meloxicam use until he is able to follow-up with his PCP and/or orthopedic office.  Reviewed blood work from September 2024 which showed normal kidney function.  Do not feel that patient would benefit from steroids or injection today given his diabetes and desire for better control of this in anticipation of possible surgery.  Discussed with patient who is comfortable with discharge and follow up plan. Discussed return  precautions.   Amount and/or Complexity of Data Reviewed Radiology: ordered.  Risk Prescription drug management.          Final Clinical Impression(s) / ED Diagnoses Final diagnoses:  None    Rx / DC Orders ED Discharge Orders     None         Vonna Drafts, MD 02/08/23 1341    Gwyneth Sprout, MD 02/08/23 1345

## 2023-02-08 NOTE — Discharge Instructions (Signed)
Your x-ray showed arthritis in your hip.  Please take meloxicam daily and follow-up with your primary care doctor and/your orthopedic doctor.  Please seek medical attention if you have worsening symptoms including numbness or weakness in your legs abdominal pain, blood in your urine, fevers, chest pain, shortness of breath.

## 2023-02-14 ENCOUNTER — Telehealth: Payer: Self-pay

## 2023-02-14 NOTE — Progress Notes (Signed)
Transition Care Management Unsuccessful Follow-up Telephone Call  Date of discharge and from where:  02/08/2023 [facility]  Attempts:  1st Attempt  Reason for unsuccessful TCM follow-up call:  No answer/busy  Brandon Finley [facility]  [facility] Guide Direct Dial: [phone]  Fax: [phone] Website: [email or web address]

## 2023-02-18 ENCOUNTER — Telehealth: Payer: Self-pay

## 2023-02-18 NOTE — Progress Notes (Signed)
Transition Care Management Unsuccessful Follow-up Telephone Call  Date of discharge and from where:  02/08/2023 [facility]  Attempts:  2nd Attempt  Reason for unsuccessful TCM follow-up call:  Left voice message  Wrigley Plasencia [facility]  [facility] Guide Direct Dial: [phone]  Fax: [phone] Website: [email or web address]

## 2023-02-27 ENCOUNTER — Ambulatory Visit: Payer: 59 | Admitting: Podiatry

## 2023-03-07 ENCOUNTER — Encounter: Payer: Self-pay | Admitting: Pharmacist

## 2023-03-07 ENCOUNTER — Other Ambulatory Visit (HOSPITAL_BASED_OUTPATIENT_CLINIC_OR_DEPARTMENT_OTHER): Payer: 59 | Admitting: Pharmacist

## 2023-03-07 DIAGNOSIS — E1165 Type 2 diabetes mellitus with hyperglycemia: Secondary | ICD-10-CM

## 2023-03-07 DIAGNOSIS — Z7985 Long-term (current) use of injectable non-insulin antidiabetic drugs: Secondary | ICD-10-CM

## 2023-03-07 DIAGNOSIS — Z794 Long term (current) use of insulin: Secondary | ICD-10-CM

## 2023-03-07 DIAGNOSIS — Z7984 Long term (current) use of oral hypoglycemic drugs: Secondary | ICD-10-CM

## 2023-03-07 NOTE — Progress Notes (Signed)
Pharmacy TNM Diabetes Measure Review  S:  Patient was identified in a report as being at risk for failing the True [place] Metric of A1c control (<8%) in [place] and African American patients. Last A1c was 8.4% 09/24/22. Last PCP visit was 09/24/2022.  Call placed to patient to discuss diabetes control and medication management. He was unavailable so I left a HIPAA-compliant VM with instructions to return my call.   Current diabetes medications include: glipizide 10 mg BID, Lantus 10u daily, Ozempic 1 mg weekly  Insurance coverage: [employer]  O:   Lab Results  Component Value Date   HGBA1C 8.4 (H) 09/24/2022   There were no vitals filed for this visit.  Lipid Panel     Component Value Date/Time   CHOL 220 (H) 10/13/2021 0943   TRIG 230 (H) 10/13/2021 0943   HDL 38 (L) 10/13/2021 0943   CHOLHDL 6.3 (H) 04/06/2020 1422   CHOLHDL 6.5 07/25/2012 1047   VLDL 16 07/25/2012 1047   LDLCALC 140 (H) 10/13/2021 0943    Clinical Atherosclerotic Cardiovascular Disease (ASCVD): No  The 10-year ASCVD risk score (Arnett DK, et al., 2019) is: 37.8%   Values used to calculate the score:     Age: 29 years     Sex: Male     Is Non-Hispanic African American: Yes     Diabetic: Yes     Tobacco smoker: No     Systolic Blood Pressure: 154 mmHg     Is BP treated: Yes     HDL Cholesterol: 38 mg/dL     Total Cholesterol: 220 mg/dL   Patient is participating in a Managed Medicaid Plan: No   A/P: Diabetes longstanding currently uncontrolled based on A1c. Left VM for patient to return call and schedule an appt.  Butch Penny, PharmD, Patsy [place], CPP Clinical Pharmacist [facility] [phone]

## 2023-03-12 ENCOUNTER — Telehealth: Payer: Self-pay | Admitting: Podiatry

## 2023-03-12 NOTE — Telephone Encounter (Signed)
 Pt left message today at 1046am wanting to r/s missed appt on 2/12.  I returned call and left message for pt to call to schedule an appt.

## 2023-03-14 ENCOUNTER — Encounter: Payer: Self-pay | Admitting: Podiatry

## 2023-03-14 ENCOUNTER — Ambulatory Visit (INDEPENDENT_AMBULATORY_CARE_PROVIDER_SITE_OTHER): Payer: 59 | Admitting: Podiatry

## 2023-03-14 DIAGNOSIS — M79675 Pain in left toe(s): Secondary | ICD-10-CM

## 2023-03-14 DIAGNOSIS — E119 Type 2 diabetes mellitus without complications: Secondary | ICD-10-CM | POA: Diagnosis not present

## 2023-03-14 DIAGNOSIS — M2010 Hallux valgus (acquired), unspecified foot: Secondary | ICD-10-CM

## 2023-03-14 DIAGNOSIS — M79674 Pain in right toe(s): Secondary | ICD-10-CM

## 2023-03-14 DIAGNOSIS — B351 Tinea unguium: Secondary | ICD-10-CM

## 2023-03-14 NOTE — Progress Notes (Signed)
This patient returns to my office for at risk foot care.  This patient requires this care by a professional since this patient will be at risk due to having  Diabetes.   This patient is unable to cut nails himself since the patient cannot reach his nails.These nails are painful walking and wearing shoes.  This patient presents for at risk foot care today.   General Appearance  Alert, conversant and in no acute stress.  Vascular  Dorsalis pedis and posterior tibial  pulses are palpable  bilaterally.  Capillary return is within normal limits  bilaterally. Temperature is within normal limits  bilaterally.  Neurologic  Senn-Weinstein monofilament wire test diminished bilaterally. Muscle power within normal limits bilaterally.  Nails Thick disfigured discolored nails with subungual debris  from hallux to fifth toes bilaterally. No evidence of bacterial infection or drainage bilaterally.  Orthopedic  No limitations of motion  feet .  No crepitus or effusions noted.  No bony pathology or digital deformities noted.  HAV  B/L.  Skin  normotropic skin with no porokeratosis noted bilaterally.  No signs of infections or ulcers noted.     Onychomycosis  Pain in right toes  Pain in left toes  Consent was obtained for treatment procedures.   Mechanical debridement of nails 1-5  bilaterally performed with a nail nipper.  Filed with dremel without incident.     Return office visit  3 months                    Told patient to return for periodic foot care and evaluation due to potential at risk complications.   Irvine Glorioso DPM  

## 2023-03-29 ENCOUNTER — Other Ambulatory Visit: Payer: Self-pay | Admitting: Orthopaedic Surgery

## 2023-04-09 ENCOUNTER — Ambulatory Visit: Payer: Medicaid Other | Attending: Family Medicine

## 2023-06-13 ENCOUNTER — Ambulatory Visit: Admitting: Podiatry

## 2023-06-13 ENCOUNTER — Ambulatory Visit: Payer: 59 | Admitting: Podiatry

## 2023-07-08 ENCOUNTER — Ambulatory Visit: Admitting: Podiatry

## 2023-07-10 ENCOUNTER — Ambulatory Visit: Admitting: Podiatry

## 2023-07-23 ENCOUNTER — Encounter: Payer: Self-pay | Admitting: Podiatry

## 2023-07-23 ENCOUNTER — Ambulatory Visit (INDEPENDENT_AMBULATORY_CARE_PROVIDER_SITE_OTHER): Admitting: Podiatry

## 2023-07-23 DIAGNOSIS — B351 Tinea unguium: Secondary | ICD-10-CM

## 2023-07-23 DIAGNOSIS — E119 Type 2 diabetes mellitus without complications: Secondary | ICD-10-CM | POA: Diagnosis not present

## 2023-07-23 DIAGNOSIS — M79675 Pain in left toe(s): Secondary | ICD-10-CM | POA: Diagnosis not present

## 2023-07-23 DIAGNOSIS — M79674 Pain in right toe(s): Secondary | ICD-10-CM

## 2023-07-23 NOTE — Progress Notes (Signed)
This patient returns to my office for at risk foot care.  This patient requires this care by a professional since this patient will be at risk due to having  Diabetes.   This patient is unable to cut nails himself since the patient cannot reach his nails.These nails are painful walking and wearing shoes.  This patient presents for at risk foot care today.   General Appearance  Alert, conversant and in no acute stress.  Vascular  Dorsalis pedis and posterior tibial  pulses are palpable  bilaterally.  Capillary return is within normal limits  bilaterally. Temperature is within normal limits  bilaterally.  Neurologic  Senn-Weinstein monofilament wire test diminished bilaterally. Muscle power within normal limits bilaterally.  Nails Thick disfigured discolored nails with subungual debris  from hallux to fifth toes bilaterally. No evidence of bacterial infection or drainage bilaterally.  Orthopedic  No limitations of motion  feet .  No crepitus or effusions noted.  No bony pathology or digital deformities noted.  HAV  B/L.  Skin  normotropic skin with no porokeratosis noted bilaterally.  No signs of infections or ulcers noted.     Onychomycosis  Pain in right toes  Pain in left toes  Consent was obtained for treatment procedures.   Mechanical debridement of nails 1-5  bilaterally performed with a nail nipper.  Filed with dremel without incident.     Return office visit  3 months                    Told patient to return for periodic foot care and evaluation due to potential at risk complications.   Irvine Glorioso DPM  

## 2023-08-10 ENCOUNTER — Emergency Department (HOSPITAL_COMMUNITY)
Admission: EM | Admit: 2023-08-10 | Discharge: 2023-08-10 | Disposition: A | Discharge status: Home / Self Care | Attending: Emergency Medicine | Admitting: Emergency Medicine

## 2023-08-10 ENCOUNTER — Encounter (HOSPITAL_COMMUNITY): Payer: Self-pay

## 2023-08-10 ENCOUNTER — Emergency Department (HOSPITAL_COMMUNITY)

## 2023-08-10 ENCOUNTER — Other Ambulatory Visit: Payer: Self-pay

## 2023-08-10 DIAGNOSIS — M25552 Pain in left hip: Secondary | ICD-10-CM | POA: Diagnosis present

## 2023-08-10 DIAGNOSIS — Z7982 Long term (current) use of aspirin: Secondary | ICD-10-CM | POA: Diagnosis not present

## 2023-08-10 DIAGNOSIS — Z794 Long term (current) use of insulin: Secondary | ICD-10-CM | POA: Insufficient documentation

## 2023-08-10 DIAGNOSIS — Z96641 Presence of right artificial hip joint: Secondary | ICD-10-CM | POA: Insufficient documentation

## 2023-08-10 DIAGNOSIS — Z79899 Other long term (current) drug therapy: Secondary | ICD-10-CM | POA: Diagnosis not present

## 2023-08-10 DIAGNOSIS — E119 Type 2 diabetes mellitus without complications: Secondary | ICD-10-CM | POA: Diagnosis not present

## 2023-08-10 DIAGNOSIS — I10 Essential (primary) hypertension: Secondary | ICD-10-CM | POA: Insufficient documentation

## 2023-08-10 DIAGNOSIS — Z7984 Long term (current) use of oral hypoglycemic drugs: Secondary | ICD-10-CM | POA: Insufficient documentation

## 2023-08-10 MED ORDER — IBUPROFEN 800 MG PO TABS
800.0000 mg | ORAL_TABLET | Freq: Once | ORAL | Status: AC
Start: 2023-08-10 — End: 2023-08-10
  Administered 2023-08-10: 800 mg via ORAL
  Filled 2023-08-10: qty 1

## 2023-08-10 MED ORDER — HYDROCODONE-ACETAMINOPHEN 5-325 MG PO TABS
1.0000 | ORAL_TABLET | Freq: Once | ORAL | Status: AC
  Administered 2023-08-10: 1 via ORAL
  Filled 2023-08-10: qty 1

## 2023-08-10 NOTE — ED Triage Notes (Signed)
 Pt c.o left hip pain since last week. Hx of arthritis in that hip

## 2023-08-10 NOTE — ED Provider Notes (Signed)
 [place] EMERGENCY DEPARTMENT AT [facility] Provider Note   CSN: [id number] Arrival date & time: 08/10/23  9094     Patient presents with: Hip Pain  HPI Brandon Finley is a 63 y.o. male with type 2 diabetes, right hip replacement, hypertension, hyperlipidemia presenting for left hip pain.  Started 2 weeks ago.  Denies trauma.  Pain is located in the lateral aspect of the left hip joint and he states it does radiate down the leg at times.  Denies any radiation into the abdomen or mid back.  Denies urinary or bowel changes, weakness and numbness.  States that this morning the    Hip Pain       Prior to Admission medications   Medication Sig Start Date End Date Taking? Authorizing Provider  Accu-Chek Softclix Lancets lancets Use as instructed 07/11/20   Newlin, Enobong, MD  Accu-Chek Softclix Lancets lancets Use as instructed 3 times daily before meals 11/20/21   Newlin, Enobong, MD  aspirin  EC 81 MG tablet Take 1 tablet (81 mg total) by mouth daily. 06/05/17   Newlin, Enobong, MD  Blood Glucose Monitoring Suppl (ACCU-CHEK GUIDE) w/Device KIT use to Test blood sugar  3 times daily 09/19/22   Newlin, Enobong, MD  cetirizine  (ZYRTEC ) 10 MG tablet Take 1 tablet (10 mg total) by mouth daily. 09/22/19   Newlin, Enobong, MD  glipiZIDE  (GLUCOTROL ) 10 MG tablet TAKE 1 TABLET (10 MG TOTAL) BY MOUTH 2 (TWO) TIMES DAILY BEFORE A MEAL. 04/06/20 04/06/21  Newlin, Enobong, MD  glucose blood (ACCU-CHEK GUIDE) test strip Use as instructed 09/19/22   Newlin, Enobong, MD  insulin  glargine (LANTUS ) 100 UNIT/ML Solostar Pen Inject 10 Units into the skin daily. 11/20/21   Newlin, Enobong, MD  Insulin  Pen Needle (TRUEPLUS PEN NEEDLES) 32G X 4 MM MISC Use to inject insulin  and Victoza  twice daily 09/22/19   Newlin, Enobong, MD  lidocaine  (LIDODERM ) 5 % Place 1 patch onto the skin daily. Remove & Discard patch within 12 hours or as directed by MD 03/12/19   Caccavale, Sophia, PA-C   lisinopril -hydrochlorothiazide  (ZESTORETIC ) 10-12.5 MG tablet Take 1 tablet by mouth every day 12/03/22   Newlin, Enobong, MD  methocarbamol  (ROBAXIN ) 500 MG tablet Take 2 tablets (1,000 mg total) by mouth 2 (two) times daily. 03/04/19   Newlin, Enobong, MD  nabumetone  (RELAFEN ) 750 MG tablet Take 1 tablet by mouth twice daily as needed 03/29/23   Vernetta Lonni GRADE, MD  PEG 3350 -KCl-Na Bicarb-NaCl (GAVILYTE-N  WITH FLAVOR PACK PO)  06/19/18   [provider]  rosuvastatin  (CRESTOR ) 20 MG tablet TAKE 1 TABLET (20 MG TOTAL) BY MOUTH DAILY. 10/09/21 10/09/22  Newlin, Enobong, MD  Semaglutide , 1 MG/DOSE, 4 MG/3ML SOPN Inject 1 mg as directed once a week. 09/25/22   Newlin, Enobong, MD  traMADol  (ULTRAM ) 50 MG tablet Take 2 tablets (100 mg total) by mouth every 12 (twelve) hours as needed. 09/24/22   Vernetta Lonni GRADE, MD  atorvastatin  (LIPITOR) 80 MG tablet Take 1 tablet (80 mg total) by mouth daily. 04/06/20 04/07/20  Newlin, Enobong, MD    Allergies: No known allergies    Review of Systems See HPI  Updated Vital Signs BP (!) 139/94   Pulse 90   Temp 98.2 F (36.8 C) (Oral)   Resp 18   SpO2 97%   Physical Exam Constitutional:      Appearance: Normal appearance.  HENT:     Head: Normocephalic.     Nose: Nose normal.  Eyes:  Conjunctiva/sclera: Conjunctivae normal.  Pulmonary:     Effort: Pulmonary effort is normal.  Abdominal:     General: Abdomen is flat. There is no distension.     Palpations: Abdomen is soft.     Tenderness: There is no abdominal tenderness.  Musculoskeletal:     Right hip: Normal.     Left hip: Tenderness present. No deformity, lacerations or crepitus. Normal range of motion. Normal strength.     Comments: Tenderness to palpation to the lateral left hip, no erythema or edema.  Range of motion of both hips is grossly normal but there was some tenderness with internal/external rotation of the left hip.  Pedal pulses are 2+ bilaterally.  Patient is  ambulatory and gait is steady.  No midline tenderness of the back.  Range of motion back to normal  Neurological:     Mental Status: He is alert.  Psychiatric:        Mood and Affect: Mood normal.     (all labs ordered are listed, but only abnormal results are displayed) Labs Reviewed - No data to display  EKG: None  Radiology: DG Hip Unilat With Pelvis 2-3 Views Left Result Date: 08/10/2023 EXAM: 3 VIEW(S) XRAY OF THE LEFT HIP 08/10/2023 10:31:00 AM COMPARISON: Left hip radiographs 02/08/2023. CLINICAL HISTORY: Left hip pain since last week. History of arthritis in that hip. FINDINGS: BONES AND JOINTS: No acute fracture is present. Progressive degenerative changes are present in the left hip. Right total hip arthroplasty noted. SOFT TISSUES: The soft tissues are unremarkable. IMPRESSION: 1. Progressive degenerative changes in the left hip. No acute fracture. 2. Right total hip arthroplasty. Electronically signed by: Lonni Necessary MD 08/10/2023 10:56 AM EDT RP Workstation: [id number]     Procedures   Medications Ordered in the ED  ibuprofen  (ADVIL ) tablet 800 mg (has no administration in time range)  HYDROcodone -acetaminophen  (NORCO/VICODIN) 5-325 MG per tablet 1 tablet (1 tablet Oral Given 08/10/23 1031)                                    Medical Decision Making Amount and/or Complexity of Data Reviewed Radiology: ordered.  Risk Prescription drug management.   63 year old well-appearing male presenting for atraumatic left hip pain.  Exam notable for tenderness with passive range of motion of the left hip and tenderness to the lateral aspect of the left hip.  Does not appear infected.  Patient is ambulatory and able to bear weight here.  X-ray revealing degenerative changes of the left hip.  Suspect this is likely chronic hip pain.  Advised him to follow-up with his PCP.  Advised conservative treatment at home with NSAIDs.  Discussed return precautions.  Discharged good  condition.     Final diagnoses:  Left hip pain    ED Discharge Orders     None          Lang Norleen POUR, PA-C 08/10/23 1108    Mannie Pac T, DO 08/12/23 [phone]

## 2023-08-10 NOTE — Discharge Instructions (Addendum)
 Evaluation today revealed degenerative changes of the left hip this is likely contributing to your symptoms.  Please follow-up with PCP.  Recommend applying ice 3-4 times a day for the next 4 days.

## 2023-09-04 ENCOUNTER — Encounter: Payer: Self-pay | Admitting: Orthopaedic Surgery

## 2023-09-04 ENCOUNTER — Ambulatory Visit: Admitting: Orthopaedic Surgery

## 2023-09-04 VITALS — Wt 267.0 lb

## 2023-09-04 DIAGNOSIS — E1165 Type 2 diabetes mellitus with hyperglycemia: Secondary | ICD-10-CM

## 2023-09-04 DIAGNOSIS — M1612 Unilateral primary osteoarthritis, left hip: Secondary | ICD-10-CM

## 2023-09-04 LAB — POCT GLYCOSYLATED HEMOGLOBIN (HGB A1C)

## 2023-09-04 NOTE — Progress Notes (Signed)
 The patient is a 63 year old gentleman well-known to us .  In 2018 we replaced his right hip secondary to significant osteoarthritis.  He does have known bone-on-bone wear of his left hip.  This been well-documented the x-rays.  We last x-rayed his hip back in January 2024 and he did go to the emergency room last month with hip pain and x-rays did confirm bone-on-bone arthritis of his left hip.  He has lost significant amount of weight.  His BMI was 38 when we saw him last and is down to 35.  However his hemoglobin A1c was elevated at over 10 when we saw him in January 2024.  I did see a hemoglobin A1c in September 2024 at 8.4.  He says he does feel like it is running better given his weight loss.  He still deals with severe left hip pain and stiffness on a daily basis.  This is causing him to not have a straight posture with standing up.  He said his right hip is doing great.  I did review with him x-rays from last month of his pelvis and left hip.  He does have bone-on-bone wear of the left hip with osteophytes around the hip and loss of joint space which is significant as well as flattening of the femoral head and sclerotic changes.  On exam his right operative hip moves smoothly and fluidly.  The left hip is incredibly stiff and painful with internal and external rotation.  We are recommending a total hip replacement for the left side.  He wants to think about this and we did give him our surgery scheduler's card to call when he decides to consider having this done.  We also did check a hemoglobin A1c in the office today 6.4.  He is much improved overall with his blood glucose control.  We are happy to replace his hip when he decides to have this scheduled.  He will let us  know.

## 2023-09-04 NOTE — Addendum Note (Signed)
 Addended by: EILLEEN ROSINA HERO on: 09/04/2023 03:31 PM   Modules accepted: Orders

## 2023-09-11 ENCOUNTER — Telehealth: Payer: Self-pay | Admitting: Orthopaedic Surgery

## 2023-09-11 NOTE — Telephone Encounter (Signed)
 Pt came into office to schedule surgery. Pt called and left a few messages. Pt asking ofr a call back concerning left hip replacement. Please call pt at [phone]. Pt states Dr Vernetta told him to get him schedule right away

## 2023-09-11 NOTE — Telephone Encounter (Signed)
 I called patient and left a voice mail for a return call to discuss.

## 2023-09-17 ENCOUNTER — Telehealth: Payer: Self-pay | Admitting: Orthopaedic Surgery

## 2023-09-17 NOTE — Telephone Encounter (Signed)
 Pt returned call to [facility] to set surgery date. Please call this pt when you get in tomorrow. Pt phone number is [phone].

## 2023-09-18 NOTE — Telephone Encounter (Signed)
I called patient and scheduled surgery. 

## 2023-10-04 ENCOUNTER — Other Ambulatory Visit: Payer: Self-pay | Admitting: Family Medicine

## 2023-10-04 DIAGNOSIS — E1159 Type 2 diabetes mellitus with other circulatory complications: Secondary | ICD-10-CM

## 2023-10-22 ENCOUNTER — Ambulatory Visit: Admitting: Podiatry

## 2023-10-22 ENCOUNTER — Encounter: Payer: Self-pay | Admitting: Podiatry

## 2023-10-22 DIAGNOSIS — E119 Type 2 diabetes mellitus without complications: Secondary | ICD-10-CM | POA: Diagnosis not present

## 2023-10-22 DIAGNOSIS — M79674 Pain in right toe(s): Secondary | ICD-10-CM

## 2023-10-22 DIAGNOSIS — M79675 Pain in left toe(s): Secondary | ICD-10-CM

## 2023-10-22 DIAGNOSIS — B351 Tinea unguium: Secondary | ICD-10-CM

## 2023-10-22 NOTE — Progress Notes (Addendum)
 This patient returns to my office for at risk foot care.  This patient requires this care by a professional since this patient will be at risk due to having  Diabetes.   This patient is unable to cut nails himself since the patient cannot reach his nails.These nails are painful walking and wearing shoes.  This patient presents for at risk foot care today.   General Appearance  Alert, conversant and in no acute stress.  Vascular  Dorsalis pedis and posterior tibial  pulses are palpable  bilaterally.  Capillary return is within normal limits  bilaterally. Temperature is within normal limits  bilaterally.  Neurologic  Senn-Weinstein monofilament wire test diminished bilaterally. Muscle power within normal limits bilaterally.  Nails Thick disfigured discolored nails with subungual debris  from hallux to fifth toes bilaterally. No evidence of bacterial infection or drainage bilaterally.  Orthopedic  No limitations of motion  feet .  No crepitus or effusions noted.  No bony pathology or digital deformities noted.  HAV  B/L.  Skin  normotropic skin with no porokeratosis noted bilaterally.  No signs of infections or ulcers noted.     Onychomycosis  Pain in right toes  Pain in left toes  Consent was obtained for treatment procedures.   Mechanical debridement of nails 1-5  bilaterally performed with a nail nipper.  Filed with dremel without incident.     Return office visit  3 months                    Told patient to return for periodic foot care and evaluation due to potential at risk complications.   Cordella Bold DPM tomma

## 2023-10-30 ENCOUNTER — Other Ambulatory Visit: Payer: Self-pay | Admitting: Physician Assistant

## 2023-10-30 DIAGNOSIS — Z01818 Encounter for other preprocedural examination: Secondary | ICD-10-CM

## 2023-11-04 ENCOUNTER — Encounter (HOSPITAL_COMMUNITY): Payer: Self-pay

## 2023-11-04 NOTE — Pre-Procedure Instructions (Signed)
 Surgical Instructions   Your procedure is scheduled on November 12, 2023. Report to [facility] Main Entrance A at 5:30 A.M., then check in with the Admitting office. Any questions or running late day of surgery: call [phone]  Questions prior to your surgery date: call [phone], Monday-Friday, 8am-4pm. If you experience any cold or flu symptoms such as cough, fever, chills, shortness of breath, etc. between now and your scheduled surgery, please notify us  at the above number.     Remember:  Do not eat after midnight the night before your surgery  You may drink clear liquids until 4:30 AM the morning of your surgery.   Clear liquids allowed are: Water, Non-Citrus Juices (without pulp), Carbonated Beverages, Clear Tea (no milk, honey, etc.), Black Coffee Only (NO MILK, CREAM OR POWDERED CREAMER of any kind), and Gatorade.  Patient Instructions  The night before surgery:  No food after midnight. ONLY clear liquids after midnight  The day of surgery (if you have diabetes): Drink ONE (1) 12 oz G2 given to you in your pre admission testing appointment by 4:30 AM the morning of surgery. Drink in one sitting. Do not sip.  This drink was given to you during your hospital  pre-op appointment visit.  Nothing else to drink after completing the  12 oz bottle of G2.         If you have questions, please contact your surgeon's office.    Take these medicines the morning of surgery with A SIP OF WATER: rosuvastatin  (CRESTOR )    May take these medicines IF NEEDED: traMADol  (ULTRAM )    Follow your surgeon's instructions on when to stop Aspirin .  If no instructions were given by your surgeon then you will need to call the office to get those instructions.     One week prior to surgery, STOP taking any Aleve , Naproxen , Ibuprofen , Motrin , Advil , Goody's, BC's, all herbal medications, fish oil, and non-prescription vitamins. This includes your medication: nabumetone  (RELAFEN )    WHAT  DO I DO ABOUT MY DIABETES MEDICATION?   STOP taking your Semaglutide  one week prior to surgery. DO NOT take any doses after October 20th.   HOW TO MANAGE YOUR DIABETES BEFORE AND AFTER SURGERY  Why is it important to control my blood sugar before and after surgery? Improving blood sugar levels before and after surgery helps healing and can limit problems. A way of improving blood sugar control is eating a healthy diet by:  Eating less sugar and carbohydrates  Increasing activity/exercise  Talking with your doctor about reaching your blood sugar goals High blood sugars (greater than 180 mg/dL) can raise your risk of infections and slow your recovery, so you will need to focus on controlling your diabetes during the weeks before surgery. Make sure that the doctor who takes care of your diabetes knows about your planned surgery including the date and location.  How do I manage my blood sugar before surgery? Check your blood sugar at least 4 times a day, starting 2 days before surgery, to make sure that the level is not too high or low.  Check your blood sugar the morning of your surgery when you wake up and every 2 hours until you get to the Short Stay unit.  If your blood sugar is less than 70 mg/dL, you will need to treat for low blood sugar: Do not take insulin . Treat a low blood sugar (less than 70 mg/dL) with  cup of clear juice (cranberry or apple), 4 glucose  tablets, OR glucose gel. Recheck blood sugar in 15 minutes after treatment (to make sure it is greater than 70 mg/dL). If your blood sugar is not greater than 70 mg/dL on recheck, call [phone] for further instructions. Report your blood sugar to the short stay nurse when you get to Short Stay.  If you are admitted to the hospital after surgery: Your blood sugar will be checked by the staff and you will probably be given insulin  after surgery (instead of oral diabetes medicines) to make sure you have good blood sugar  levels. The goal for blood sugar control after surgery is 80-180 mg/dL.                      Do NOT Smoke (Tobacco/Vaping) for 24 hours prior to your procedure.  If you use a CPAP at night, you may bring your mask/headgear for your overnight stay.   You will be asked to remove any contacts, glasses, piercing's, hearing aid's, dentures/partials prior to surgery. Please bring cases for these items if needed.    Patients discharged the day of surgery will not be allowed to drive home, and someone needs to stay with them for 24 hours.  SURGICAL WAITING ROOM VISITATION Patients may have no more than 2 support people in the waiting area - these visitors may rotate.   Pre-op nurse will coordinate an appropriate time for 1 ADULT support person, who may not rotate, to accompany patient in pre-op.  Children under the age of 49 must have an adult with them who is not the patient and must remain in the main waiting area with an adult.  If the patient needs to stay at the hospital during part of their recovery, the visitor guidelines for inpatient rooms apply.  Please refer to the [facility] website for the visitor guidelines for any additional information.   If you received a COVID test during your pre-op visit  it is requested that you wear a mask when out in public, stay away from anyone that may not be feeling well and notify your surgeon if you develop symptoms. If you have been in contact with anyone that has tested positive in the last 10 days please notify you surgeon.      Pre-operative 4 CHG Bathing Instructions   You can play a key role in reducing the risk of infection after surgery. Your skin needs to be as free of germs as possible. You can reduce the number of germs on your skin by washing with CHG (chlorhexidine  gluconate) soap before surgery. CHG is an antiseptic soap that kills germs and continues to kill germs even after washing.   DO NOT use if you have an allergy to  chlorhexidine /CHG or antibacterial soaps. If your skin becomes reddened or irritated, stop using the CHG and notify one of our RNs at [phone].   Please shower with the CHG soap starting 4 days before surgery using the following schedule:     Please keep in mind the following:  DO NOT shave, including legs and underarms, starting the day of your first shower.   You may shave your face at any point before/day of surgery.  Place clean sheets on your bed the day you start using CHG soap. Use a clean washcloth (not used since being washed) for each shower. DO NOT sleep with pets once you start using the CHG.   CHG Shower Instructions:  Wash your face and private area with normal soap. If  you choose to wash your hair, wash first with your normal shampoo.  After you use shampoo/soap, rinse your hair and body thoroughly to remove shampoo/soap residue.  Turn the water OFF and apply  bottle of CHG soap to a CLEAN washcloth.  Apply CHG soap ONLY FROM YOUR NECK DOWN TO YOUR TOES (washing for 3-5 minutes)  DO NOT use CHG soap on face, private areas, open wounds, or sores.  Pay special attention to the area where your surgery is being performed.  If you are having back surgery, having someone wash your back for you may be helpful. Wait 2 minutes after CHG soap is applied, then you may rinse off the CHG soap.  Pat dry with a clean towel  Put on clean clothes/pajamas   If you choose to wear lotion, please use ONLY the CHG-compatible lotions that are listed below.  Additional instructions for the day of surgery:  If you choose, you may shower the morning of surgery with an antibacterial soap.  DO NOT APPLY any lotions, deodorants, cologne, or perfumes.   Do not bring valuables to the hospital. [facility] is not responsible for any belongings/valuables. Do not wear nail polish, gel polish, artificial nails, or any other type of covering on natural nails (fingers and toes) Do not wear jewelry or  makeup Put on clean/comfortable clothes.  Please brush your teeth.  Ask your nurse before applying any prescription medications to the skin.     CHG Compatible Lotions   Aveeno Moisturizing lotion  Cetaphil Moisturizing Cream  Cetaphil Moisturizing Lotion  Clairol Herbal Essence Moisturizing Lotion, Dry Skin  Clairol Herbal Essence Moisturizing Lotion, Extra Dry Skin  Clairol Herbal Essence Moisturizing Lotion, Normal Skin  Curel Age Defying Therapeutic Moisturizing Lotion with Alpha Hydroxy  Curel Extreme Care Body Lotion  Curel Soothing Hands Moisturizing Hand Lotion  Curel Therapeutic Moisturizing Cream, Fragrance-Free  Curel Therapeutic Moisturizing Lotion, Fragrance-Free  Curel Therapeutic Moisturizing Lotion, Original Formula  Eucerin Daily Replenishing Lotion  Eucerin Dry Skin Therapy Plus Alpha Hydroxy Crme  Eucerin Dry Skin Therapy Plus Alpha Hydroxy Lotion  Eucerin Original Crme  Eucerin Original Lotion  Eucerin Plus Crme Eucerin Plus Lotion  Eucerin TriLipid Replenishing Lotion  Keri Anti-Bacterial Hand Lotion  Keri Deep Conditioning Original Lotion Dry Skin Formula Softly Scented  Keri Deep Conditioning Original Lotion, Fragrance Free Sensitive Skin Formula  Keri Lotion Fast Absorbing Fragrance Free Sensitive Skin Formula  Keri Lotion Fast Absorbing Softly Scented Dry Skin Formula  Keri Original Lotion  Keri Skin Renewal Lotion Keri Silky Smooth Lotion  Keri Silky Smooth Sensitive Skin Lotion  Nivea Body Creamy Conditioning Oil  Nivea Body Extra Enriched Lotion  Nivea Body Original Lotion  Nivea Body Sheer Moisturizing Lotion Nivea Crme  Nivea Skin Firming Lotion  NutraDerm 30 Skin Lotion  NutraDerm Skin Lotion  NutraDerm Therapeutic Skin Cream  NutraDerm Therapeutic Skin Lotion  ProShield Protective Hand Cream  Provon moisturizing lotion  Please read over the following fact sheets that you were given.

## 2023-11-05 ENCOUNTER — Encounter (HOSPITAL_COMMUNITY)
Admission: RE | Admit: 2023-11-05 | Discharge: 2023-11-05 | Disposition: A | Discharge status: Home / Self Care | Source: Ambulatory Visit | Attending: Orthopaedic Surgery | Admitting: Orthopaedic Surgery

## 2023-11-05 ENCOUNTER — Other Ambulatory Visit: Payer: Self-pay

## 2023-11-05 ENCOUNTER — Encounter (HOSPITAL_COMMUNITY): Payer: Self-pay

## 2023-11-05 VITALS — BP 132/91 | HR 77 | Temp 98.4°F | Resp 18 | Ht 74.0 in | Wt 272.0 lb

## 2023-11-05 DIAGNOSIS — Z01818 Encounter for other preprocedural examination: Secondary | ICD-10-CM | POA: Insufficient documentation

## 2023-11-05 DIAGNOSIS — E119 Type 2 diabetes mellitus without complications: Secondary | ICD-10-CM | POA: Insufficient documentation

## 2023-11-05 DIAGNOSIS — M2010 Hallux valgus (acquired), unspecified foot: Secondary | ICD-10-CM

## 2023-11-05 HISTORY — DX: Pneumonia, unspecified organism: J18.9

## 2023-11-05 HISTORY — DX: Type 2 diabetes mellitus without complications: E11.9

## 2023-11-05 LAB — TYPE AND SCREEN
ABO/RH(D): O POS
Antibody Screen: NEGATIVE

## 2023-11-05 LAB — BASIC METABOLIC PANEL WITH GFR
Anion gap: 8 (ref 5–15)
BUN: 5 mg/dL — ABNORMAL LOW (ref 8–23)
CO2: 24 mmol/L (ref 22–32)
Calcium: 9 mg/dL (ref 8.9–10.3)
Chloride: 104 mmol/L (ref 98–111)
Creatinine, Ser: 0.59 mg/dL — ABNORMAL LOW (ref 0.61–1.24)
GFR, Estimated: >60 mL/min (ref >60)
Glucose, Bld: 178 mg/dL — ABNORMAL HIGH (ref 70–99)
Potassium: 4.3 mmol/L (ref 3.5–5.1)
Sodium: 136 mmol/L (ref 135–145)

## 2023-11-05 LAB — CBC
HCT: 43.6 % (ref 39.0–52.0)
Hemoglobin: 15.1 g/dL (ref 13.0–17.0)
MCH: 31.5 pg (ref 26.0–34.0)
MCHC: 34.6 g/dL (ref 30.0–36.0)
MCV: 90.8 fL (ref 80.0–100.0)
Platelets: 206 K/uL (ref 150–400)
RBC: 4.8 MIL/uL (ref 4.22–5.81)
RDW: 11 % — ABNORMAL LOW (ref 11.5–15.5)
WBC: 4.8 K/uL (ref 4.0–10.5)
nRBC: 0 % (ref 0.0–0.2)

## 2023-11-05 LAB — HEMOGLOBIN A1C
Hgb A1c MFr Bld: 5.9 % — ABNORMAL HIGH (ref 4.8–5.6)
Mean Plasma Glucose: 122.63 mg/dL

## 2023-11-05 LAB — SURGICAL PCR SCREEN
MRSA, PCR: NEGATIVE
Staphylococcus aureus: NEGATIVE

## 2023-11-05 LAB — GLUCOSE, CAPILLARY: Glucose-Capillary: 201 mg/dL — ABNORMAL HIGH (ref 70–99)

## 2023-11-05 NOTE — Progress Notes (Signed)
 PCP - Derick Fairly, MD at [facility] Cardiologist - Denies  PPM/ICD - Denies Device Orders - n/a Rep Notified - n/a  Chest x-ray - n/a EKG - 11/05/2023 Stress Test - Denies ECHO - Denies Cardiac Cath - Denies  Sleep Study - Denies CPAP - n/a  Pt is DM2. He checks his blood sugar 2x/day. Normal fasting is low 100s, but CBG at pre-op was 201 and he states he has only had water and gum today. A1c done at Dr. Damian office on 8/20 6.4. A1c rechecked today and result pending. RN educated pt to try and eat low card/sugar leading up to surgery, as high blood sugars could result in cancellation.  Last dose of GLP1 agonist- Last dose of Ozempic  10/15 GLP1 instructions: Pt instructed to not take anymore doses of medication until after surgery  Blood Thinner Instructions: n/a Aspirin  Instructions: Pt instructed to reach out to surgeon's office for ASA instructions  ERAS Protcol - Clear liquids until 0430 morning of surgery PRE-SURGERY Ensure or G2- G2 given to pt with instructions   COVID TEST- n/a   Anesthesia review: Yes. Borderline EKG review  Patient denies shortness of breath, fever, cough and chest pain at PAT appointment. Pt denies any respiratory illness/infection in the last two months.    All instructions explained to the patient, with a verbal understanding of the material. Patient agrees to go over the instructions while at home for a better understanding. Patient also instructed to self quarantine after being tested for COVID-19. The opportunity to ask questions was provided.

## 2023-11-11 NOTE — Anesthesia Preprocedure Evaluation (Signed)
 Anesthesia Evaluation  Patient identified by MRN, date of birth, ID band Patient awake    Reviewed: Allergy & Precautions, NPO status , Patient's Chart, lab work & pertinent test results, reviewed documented beta blocker date and time   Airway Mallampati: II  TM Distance: >3 FB Neck ROM: Full    Dental no notable dental hx. (+) Teeth Intact, Dental Advisory Given, Loose, Missing,    Pulmonary pneumonia, resolved, former smoker   Pulmonary exam normal breath sounds clear to auscultation       Cardiovascular hypertension, Pt. on medications Normal cardiovascular exam Rhythm:Regular Rate:Normal  EKG 11/05/23 Normal sinus rhythm Minimal voltage criteria for LVH, may be normal variant ( Sokolow-Lyon )    Neuro/Psych negative neurological ROS  negative psych ROS   GI/Hepatic negative GI ROS, Neg liver ROS,,,  Endo/Other  diabetes, Well Controlled, Type 2, Insulin  Dependent, Oral Hypoglycemic Agents  Obesity HLD GLP-1 RA therapy- last dose  Renal/GU negative Renal ROS  negative genitourinary   Musculoskeletal  (+) Arthritis , Osteoarthritis,  OA left hip   Abdominal  (+) + obese  Peds  Hematology negative hematology ROS (+)   Anesthesia Other Findings   Reproductive/Obstetrics                              Anesthesia Physical Anesthesia Plan  ASA: 2  Anesthesia Plan: Spinal   Post-op Pain Management: Dilaudid  IV, Precedex and Ofirmev  IV (intra-op)*   Induction: Intravenous  PONV Risk Score and Plan: 2 and Treatment may vary due to age or medical condition, Propofol  infusion, Midazolam  and Ondansetron   Airway Management Planned: Natural Airway and Simple Face Mask  Additional Equipment: None  Intra-op Plan:   Post-operative Plan:   Informed Consent: I have reviewed the patients History and Physical, chart, labs and discussed the procedure including the risks, benefits and  alternatives for the proposed anesthesia with the patient or authorized representative who has indicated his/her understanding and acceptance.     Dental advisory given  Plan Discussed with: CRNA and Anesthesiologist  Anesthesia Plan Comments:          Anesthesia Quick Evaluation

## 2023-11-11 NOTE — H&P (Signed)
 TOTAL HIP ADMISSION H&P  Patient is admitted for left total hip arthroplasty.  Subjective:  Chief Complaint: left hip pain  HPI: Brandon Finley, 63 y.o. male, has a history of pain and functional disability in the left hip(s) due to arthritis and patient has failed non-surgical conservative treatments for greater than 12 weeks to include NSAID's and/or analgesics, corticosteriod injections, flexibility and strengthening excercises, use of assistive devices, weight reduction as appropriate, and activity modification.  Onset of symptoms was gradual starting a few years ago with gradually worsening course since that time.The patient noted no past surgery on the right hip(s).  Patient currently rates pain in the right hip at 10 out of 10 with activity. Patient has night pain, worsening of pain with activity and weight bearing, trendelenberg gait, pain that interfers with activities of daily living, and pain with passive range of motion. Patient has evidence of subchondral sclerosis, periarticular osteophytes, and joint space narrowing by imaging studies. This condition presents safety issues increasing the risk of falls.  There is no current active infection.  Patient Active Problem List   Diagnosis Date Noted   Unilateral primary osteoarthritis, left hip 01/24/2022   Hav (hallux abducto valgus), unspecified laterality 07/05/2021   Hyperlipidemia 07/10/2018   Hypertension 06/22/2016   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   Post-traumatic osteoarthritis of right hip 09/14/2015   Right hip pain 09/14/2015   Type 2 diabetes mellitus (HCC) 07/25/2012   Morbid obesity (HCC) 07/25/2012   Past Medical History:  Diagnosis Date   Arthritis    Diabetes mellitus without complication (HCC)    Hypertension    Pneumonia    when pt was 63 years old    Past Surgical History:  Procedure Laterality Date   COLONOSCOPY     TOTAL HIP ARTHROPLASTY Right 03/13/2016   Procedure: RIGHT TOTAL  HIP ARTHROPLASTY ANTERIOR APPROACH;  Surgeon: [name], MD;  Location: [facility] OR;  Service: Orthopedics;  Laterality: Right;    No current facility-administered medications for this encounter.   Current Outpatient Medications  Medication Sig Dispense Refill Last Dose/Taking   aspirin  EC 81 MG tablet Take 1 tablet (81 mg total) by mouth daily. 90 tablet 1 Taking   lisinopril -hydrochlorothiazide  (ZESTORETIC ) 10-12.5 MG tablet Take 1 tablet by mouth every day 90 tablet 1 Taking   nabumetone  (RELAFEN ) 750 MG tablet Take 1 tablet by mouth twice daily as needed 60 tablet 11 Taking As Needed   rosuvastatin  (CRESTOR ) 20 MG tablet TAKE 1 TABLET (20 MG TOTAL) BY MOUTH DAILY. 90 tablet 1 Taking   Semaglutide , 1 MG/DOSE, 4 MG/3ML SOPN Inject 1 mg as directed once a week. 3 mL 3 Taking   Vitamin D , Ergocalciferol , (DRISDOL) 1.25 MG (50000 UNIT) CAPS capsule Take 50,000 Units by mouth once a week.   Taking   Accu-Chek Softclix Lancets lancets Use as instructed 100 each 12    Accu-Chek Softclix Lancets lancets Use as instructed 3 times daily before meals 100 each 12    Blood Glucose Monitoring Suppl (ACCU-CHEK GUIDE) w/Device KIT use to Test blood sugar  3 times daily 1 kit 0    cetirizine  (ZYRTEC ) 10 MG tablet Take 1 tablet (10 mg total) by mouth daily. (Patient not taking: Reported on 11/01/2023) 30 tablet 1 Not Taking   glipiZIDE  (GLUCOTROL ) 10 MG tablet TAKE 1 TABLET (10 MG TOTAL) BY MOUTH 2 (TWO) TIMES DAILY BEFORE A MEAL. (Patient not taking: Reported on 11/01/2023) 180 tablet 1 Not Taking  glucose blood (ACCU-CHEK GUIDE) test strip Use as instructed 100 each 12    insulin  glargine (LANTUS ) 100 UNIT/ML Solostar Pen Inject 10 Units into the skin daily. (Patient not taking: Reported on 11/01/2023) 30 mL 6 Not Taking   Insulin  Pen Needle (TRUEPLUS PEN NEEDLES) 32G X 4 MM MISC Use to inject insulin  and Victoza  twice daily 100 each 6    lidocaine  (LIDODERM ) 5 % Place 1 patch onto the skin daily.  Remove & Discard patch within 12 hours or as directed by MD (Patient not taking: Reported on 11/01/2023) 30 patch 0 Not Taking   methocarbamol  (ROBAXIN ) 500 MG tablet Take 2 tablets (1,000 mg total) by mouth 2 (two) times daily. (Patient not taking: Reported on 11/01/2023) 120 tablet 2 Not Taking   PEG 3350 -KCl-Na Bicarb-NaCl (GAVILYTE-N  WITH FLAVOR PACK PO)       traMADol  (ULTRAM ) 50 MG tablet Take 2 tablets (100 mg total) by mouth every 12 (twelve) hours as needed. 30 tablet 0    No Known Allergies  Social History   Tobacco Use   Smoking status: Former    Current packs/day: 0.00    Types: Cigarettes    Quit date: 01/16/1995    Years since quitting: 28.8   Smokeless tobacco: Never  Substance Use Topics   Alcohol use: Not Currently    Family History  Problem Relation Age of Onset   Hypertension Father      Review of Systems  Objective:  Physical Exam Vitals reviewed.  Constitutional:      Appearance: Normal appearance. He is obese.  HENT:     Head: Normocephalic and atraumatic.  Eyes:     Extraocular Movements: Extraocular movements intact.     Pupils: Pupils are equal, round, and reactive to light.  Cardiovascular:     Rate and Rhythm: Normal rate and regular rhythm.  Pulmonary:     Effort: Pulmonary effort is normal.     Breath sounds: Normal breath sounds.  Abdominal:     Palpations: Abdomen is soft.  Musculoskeletal:     Cervical back: Normal range of motion and neck supple.     Left hip: Tenderness and bony tenderness present. Decreased range of motion. Decreased strength.  Neurological:     Mental Status: He is alert and oriented to person, place, and time.  Psychiatric:        Behavior: Behavior normal.     Vital signs in last 24 hours:    Labs:   Estimated body mass index is 34.92 kg/m as calculated from the following:   Height as of 11/05/23: 6' 2 (1.88 m).   Weight as of 11/05/23: 123.4 kg.   Imaging Review Plain radiographs demonstrate  severe degenerative joint disease of the left hip(s). The bone quality appears to be excellent for age and reported activity level.      Assessment/Plan:  End stage arthritis, left hip(s)  The patient history, physical examination, clinical judgement of the provider and imaging studies are consistent with end stage degenerative joint disease of the left hip(s) and total hip arthroplasty is deemed medically necessary. The treatment options including medical management, injection therapy, arthroscopy and arthroplasty were discussed at length. The risks and benefits of total hip arthroplasty were presented and reviewed. The risks due to aseptic loosening, infection, stiffness, dislocation/subluxation,  thromboembolic complications and other imponderables were discussed.  The patient acknowledged the explanation, agreed to proceed with the plan and consent was signed. Patient is being admitted for inpatient treatment for surgery,  pain control, PT, OT, prophylactic antibiotics, VTE prophylaxis, progressive ambulation and ADL's and discharge planning.The patient is planning to be discharged home with home health services

## 2023-11-12 ENCOUNTER — Observation Stay (HOSPITAL_COMMUNITY)
Admission: RE | Admit: 2023-11-12 | Discharge: 2023-11-13 | Disposition: A | Discharge status: Home / Self Care | Attending: Orthopaedic Surgery | Admitting: Orthopaedic Surgery

## 2023-11-12 ENCOUNTER — Other Ambulatory Visit: Payer: Self-pay

## 2023-11-12 ENCOUNTER — Ambulatory Visit (HOSPITAL_COMMUNITY)

## 2023-11-12 ENCOUNTER — Ambulatory Visit (HOSPITAL_BASED_OUTPATIENT_CLINIC_OR_DEPARTMENT_OTHER): Admitting: Anesthesiology

## 2023-11-12 ENCOUNTER — Encounter (HOSPITAL_COMMUNITY): Payer: Self-pay | Admitting: Orthopaedic Surgery

## 2023-11-12 ENCOUNTER — Ambulatory Visit (HOSPITAL_COMMUNITY): Payer: Self-pay | Admitting: Physician Assistant

## 2023-11-12 ENCOUNTER — Observation Stay (HOSPITAL_COMMUNITY)

## 2023-11-12 ENCOUNTER — Encounter (HOSPITAL_COMMUNITY): Admission: RE | Disposition: A | Payer: Self-pay | Source: Home / Self Care | Attending: Orthopaedic Surgery

## 2023-11-12 DIAGNOSIS — M1612 Unilateral primary osteoarthritis, left hip: Secondary | ICD-10-CM

## 2023-11-12 DIAGNOSIS — Z96641 Presence of right artificial hip joint: Secondary | ICD-10-CM | POA: Diagnosis not present

## 2023-11-12 DIAGNOSIS — Z87891 Personal history of nicotine dependence: Secondary | ICD-10-CM | POA: Diagnosis not present

## 2023-11-12 DIAGNOSIS — Z7982 Long term (current) use of aspirin: Secondary | ICD-10-CM | POA: Diagnosis not present

## 2023-11-12 DIAGNOSIS — I10 Essential (primary) hypertension: Secondary | ICD-10-CM | POA: Diagnosis not present

## 2023-11-12 DIAGNOSIS — Z79899 Other long term (current) drug therapy: Secondary | ICD-10-CM | POA: Diagnosis not present

## 2023-11-12 DIAGNOSIS — Z794 Long term (current) use of insulin: Secondary | ICD-10-CM | POA: Diagnosis not present

## 2023-11-12 DIAGNOSIS — M25552 Pain in left hip: Secondary | ICD-10-CM | POA: Diagnosis present

## 2023-11-12 DIAGNOSIS — E119 Type 2 diabetes mellitus without complications: Secondary | ICD-10-CM | POA: Diagnosis not present

## 2023-11-12 DIAGNOSIS — Z96642 Presence of left artificial hip joint: Secondary | ICD-10-CM

## 2023-11-12 HISTORY — PX: TOTAL HIP ARTHROPLASTY: SHX124

## 2023-11-12 LAB — GLUCOSE, CAPILLARY
Glucose-Capillary: 143 mg/dL — ABNORMAL HIGH (ref 70–99)
Glucose-Capillary: 150 mg/dL — ABNORMAL HIGH (ref 70–99)
Glucose-Capillary: 157 mg/dL — ABNORMAL HIGH (ref 70–99)
Glucose-Capillary: 196 mg/dL — ABNORMAL HIGH (ref 70–99)
Glucose-Capillary: 136 mg/dL — ABNORMAL HIGH (ref 70–99)

## 2023-11-12 LAB — ABO/RH: ABO/RH(D): O POS

## 2023-11-12 SURGERY — ARTHROPLASTY, HIP, TOTAL, ANTERIOR APPROACH
Anesthesia: Spinal | Site: Hip | Laterality: Left

## 2023-11-12 MED ORDER — PHENOL 1.4 % MT LIQD: 1.0000 | OROMUCOSAL | Status: DC | PRN

## 2023-11-12 MED ORDER — HYDROMORPHONE HCL 1 MG/ML IJ SOLN
0.5000 mg | INTRAMUSCULAR | Status: DC | PRN
  Administered 2023-11-13: 1 mg via INTRAVENOUS
  Filled 2023-11-12: qty 1

## 2023-11-12 MED ORDER — METHOCARBAMOL 1000 MG/10ML IJ SOLN: 500.0000 mg | Freq: Four times a day (QID) | INTRAMUSCULAR | Status: DC | PRN

## 2023-11-12 MED ORDER — TRANEXAMIC ACID-NACL 1000-0.7 MG/100ML-% IV SOLN
1000.0000 mg | INTRAVENOUS | Status: AC
  Administered 2023-11-12: 1000 mg via INTRAVENOUS
  Filled 2023-11-12: qty 100

## 2023-11-12 MED ORDER — ONDANSETRON HCL 4 MG/2ML IJ SOLN: 4.0000 mg | Freq: Four times a day (QID) | INTRAMUSCULAR | Status: DC | PRN

## 2023-11-12 MED ORDER — PROPOFOL 10 MG/ML IV BOLUS
INTRAVENOUS | Status: DC | PRN
  Administered 2023-11-12: 20 mg via INTRAVENOUS
  Administered 2023-11-12: 50 mg via INTRAVENOUS
  Administered 2023-11-12: 20 mg via INTRAVENOUS

## 2023-11-12 MED ORDER — ONDANSETRON HCL 4 MG PO TABS: 4.0000 mg | ORAL_TABLET | Freq: Four times a day (QID) | ORAL | Status: DC | PRN

## 2023-11-12 MED ORDER — SODIUM CHLORIDE 0.9 % IV SOLN: INTRAVENOUS | Status: DC

## 2023-11-12 MED ORDER — METOCLOPRAMIDE HCL 5 MG/ML IJ SOLN: 5.0000 mg | Freq: Three times a day (TID) | INTRAMUSCULAR | Status: DC | PRN

## 2023-11-12 MED ORDER — OXYCODONE HCL 5 MG PO TABS
5.0000 mg | ORAL_TABLET | ORAL | Status: DC | PRN
  Filled 2023-11-12 (×4): qty 2

## 2023-11-12 MED ORDER — LISINOPRIL 10 MG PO TABS
10.0000 mg | ORAL_TABLET | Freq: Every day | ORAL | Status: DC
  Administered 2023-11-12 – 2023-11-13 (×2): 10 mg via ORAL
  Filled 2023-11-12 (×2): qty 1

## 2023-11-12 MED ORDER — OXYCODONE HCL 5 MG PO TABS: 5.0000 mg | ORAL_TABLET | Freq: Once | ORAL | Status: DC | PRN

## 2023-11-12 MED ORDER — METHOCARBAMOL 500 MG PO TABS
500.0000 mg | ORAL_TABLET | Freq: Four times a day (QID) | ORAL | Status: DC | PRN
Start: 2023-11-12 — End: 2023-11-13
  Administered 2023-11-12 – 2023-11-13 (×4): 500 mg via ORAL
  Filled 2023-11-12 (×4): qty 1

## 2023-11-12 MED ORDER — ALUM & MAG HYDROXIDE-SIMETH 200-200-20 MG/5ML PO SUSP: 30.0000 mL | ORAL | Status: DC | PRN

## 2023-11-12 MED ORDER — MENTHOL 3 MG MT LOZG: 1.0000 | LOZENGE | OROMUCOSAL | Status: DC | PRN

## 2023-11-12 MED ORDER — HYDROCHLOROTHIAZIDE 12.5 MG PO TABS
12.5000 mg | ORAL_TABLET | Freq: Every day | ORAL | Status: DC
  Administered 2023-11-12 – 2023-11-13 (×2): 12.5 mg via ORAL
  Filled 2023-11-12 (×2): qty 1

## 2023-11-12 MED ORDER — CEFAZOLIN SODIUM-DEXTROSE 3-4 GM/150ML-% IV SOLN
3.0000 g | INTRAVENOUS | Status: AC
  Administered 2023-11-12: 3 g via INTRAVENOUS
  Filled 2023-11-12: qty 150

## 2023-11-12 MED ORDER — HYDROMORPHONE HCL 1 MG/ML IJ SOLN
0.2500 mg | INTRAMUSCULAR | Status: DC | PRN
  Administered 2023-11-12 (×3): 0.5 mg via INTRAVENOUS

## 2023-11-12 MED ORDER — POVIDONE-IODINE 10 % EX SWAB
2.0000 | Freq: Once | CUTANEOUS | Status: AC
  Administered 2023-11-12: 2 via TOPICAL

## 2023-11-12 MED ORDER — PROPOFOL 10 MG/ML IV BOLUS
INTRAVENOUS | Status: AC
Start: 2023-11-12 — End: 2023-11-12
  Filled 2023-11-12: qty 20

## 2023-11-12 MED ORDER — INSULIN ASPART 100 UNIT/ML IJ SOLN
0.0000 [IU] | INTRAMUSCULAR | Status: DC | PRN
  Administered 2023-11-12: 2 [IU] via SUBCUTANEOUS
  Filled 2023-11-12: qty 1

## 2023-11-12 MED ORDER — DOCUSATE SODIUM 100 MG PO CAPS
100.0000 mg | ORAL_CAPSULE | Freq: Two times a day (BID) | ORAL | Status: DC
  Administered 2023-11-12 – 2023-11-13 (×3): 100 mg via ORAL
  Filled 2023-11-12 (×3): qty 1

## 2023-11-12 MED ORDER — LISINOPRIL-HYDROCHLOROTHIAZIDE 10-12.5 MG PO TABS: 1.0000 | ORAL_TABLET | Freq: Every day | ORAL | Status: DC

## 2023-11-12 MED ORDER — ACETAMINOPHEN 325 MG PO TABS: 325.0000 mg | ORAL_TABLET | Freq: Four times a day (QID) | ORAL | Status: DC | PRN

## 2023-11-12 MED ORDER — LACTATED RINGERS IV SOLN: INTRAVENOUS | Status: DC

## 2023-11-12 MED ORDER — INSULIN ASPART 100 UNIT/ML IJ SOLN: 0.0000 [IU] | Freq: Every day | INTRAMUSCULAR | Status: DC

## 2023-11-12 MED ORDER — ORAL CARE MOUTH RINSE: 15.0000 mL | Freq: Once | OROMUCOSAL | Status: AC

## 2023-11-12 MED ORDER — CHLORHEXIDINE GLUCONATE 0.12 % MT SOLN
15.0000 mL | Freq: Once | OROMUCOSAL | Status: AC
  Administered 2023-11-12: 15 mL via OROMUCOSAL
  Filled 2023-11-12: qty 15

## 2023-11-12 MED ORDER — BUPIVACAINE IN DEXTROSE 0.75-8.25 % IT SOLN
INTRATHECAL | Status: DC | PRN
  Administered 2023-11-12: 2 mL via INTRATHECAL

## 2023-11-12 MED ORDER — HYDROMORPHONE HCL 1 MG/ML IJ SOLN
INTRAMUSCULAR | Status: AC
  Filled 2023-11-12: qty 1

## 2023-11-12 MED ORDER — ALBUMIN HUMAN 5 % IV SOLN: INTRAVENOUS | Status: DC | PRN

## 2023-11-12 MED ORDER — PHENYLEPHRINE HCL-NACL 20-0.9 MG/250ML-% IV SOLN
INTRAVENOUS | Status: DC | PRN
  Administered 2023-11-12: 30 ug/min via INTRAVENOUS

## 2023-11-12 MED ORDER — PROPOFOL 500 MG/50ML IV EMUL
INTRAVENOUS | Status: DC | PRN
Start: 2023-11-12 — End: 2023-11-12
  Administered 2023-11-12: 125 ug/kg/min via INTRAVENOUS

## 2023-11-12 MED ORDER — OXYCODONE HCL 5 MG PO TABS
10.0000 mg | ORAL_TABLET | ORAL | Status: DC | PRN
  Administered 2023-11-12 – 2023-11-13 (×7): 10 mg via ORAL
  Filled 2023-11-12: qty 2
  Filled 2023-11-12: qty 3
  Filled 2023-11-12: qty 2

## 2023-11-12 MED ORDER — METOCLOPRAMIDE HCL 5 MG PO TABS: 5.0000 mg | ORAL_TABLET | Freq: Three times a day (TID) | ORAL | Status: DC | PRN

## 2023-11-12 MED ORDER — INSULIN ASPART 100 UNIT/ML IJ SOLN
0.0000 [IU] | Freq: Three times a day (TID) | INTRAMUSCULAR | Status: DC
  Administered 2023-11-12 – 2023-11-13 (×3): 3 [IU] via SUBCUTANEOUS

## 2023-11-12 MED ORDER — DROPERIDOL 2.5 MG/ML IJ SOLN: 0.6250 mg | Freq: Once | INTRAMUSCULAR | Status: DC | PRN

## 2023-11-12 MED ORDER — CEFAZOLIN SODIUM-DEXTROSE 2-4 GM/100ML-% IV SOLN
2.0000 g | Freq: Four times a day (QID) | INTRAVENOUS | Status: AC
  Administered 2023-11-12 (×2): 2 g via INTRAVENOUS
  Filled 2023-11-12 (×2): qty 100

## 2023-11-12 MED ORDER — OXYCODONE HCL 5 MG/5ML PO SOLN: 5.0000 mg | Freq: Once | ORAL | Status: DC | PRN

## 2023-11-12 MED ORDER — ASPIRIN 81 MG PO CHEW
81.0000 mg | CHEWABLE_TABLET | Freq: Two times a day (BID) | ORAL | Status: DC
  Administered 2023-11-12 – 2023-11-13 (×2): 81 mg via ORAL
  Filled 2023-11-12 (×2): qty 1

## 2023-11-12 MED ORDER — ONDANSETRON HCL 4 MG/2ML IJ SOLN: 4.0000 mg | Freq: Once | INTRAMUSCULAR | Status: DC | PRN

## 2023-11-12 MED ORDER — DIPHENHYDRAMINE HCL 12.5 MG/5ML PO ELIX: 12.5000 mg | ORAL_SOLUTION | ORAL | Status: DC | PRN

## 2023-11-12 MED ORDER — PANTOPRAZOLE SODIUM 40 MG PO TBEC
40.0000 mg | DELAYED_RELEASE_TABLET | Freq: Every day | ORAL | Status: DC
Start: 2023-11-12 — End: 2023-11-13
  Administered 2023-11-12 – 2023-11-13 (×2): 40 mg via ORAL
  Filled 2023-11-12 (×2): qty 1

## 2023-11-12 MED ORDER — DEXAMETHASONE SOD PHOSPHATE PF 10 MG/ML IJ SOLN
INTRAMUSCULAR | Status: DC | PRN
  Administered 2023-11-12: 4 mg via INTRAVENOUS

## 2023-11-12 SURGICAL SUPPLY — 42 items
BAG COUNTER SPONGE SURGICOUNT (BAG) ×1 IMPLANT
BENZOIN TINCTURE PRP APPL 2/3 (GAUZE/BANDAGES/DRESSINGS) ×1 IMPLANT
BLADE CLIPPER SURG (BLADE) IMPLANT
BLADE SAW SGTL 18X1.27X75 (BLADE) ×1 IMPLANT
COVER SURGICAL LIGHT HANDLE (MISCELLANEOUS) ×1 IMPLANT
CUP GRIPTION SECTOR 62MM (Orthopedic Implant) IMPLANT
DRAPE C-ARM 42X72 X-RAY (DRAPES) ×1 IMPLANT
DRAPE STERI IOBAN 125X83 (DRAPES) ×1 IMPLANT
DRAPE U-SHAPE 47X51 STRL (DRAPES) ×3 IMPLANT
DRESSING AQUACEL AG SP 3.5X10 (GAUZE/BANDAGES/DRESSINGS) IMPLANT
DRSG AQUACEL AG ADV 3.5X10 (GAUZE/BANDAGES/DRESSINGS) ×1 IMPLANT
DURAPREP 26ML APPLICATOR (WOUND CARE) ×1 IMPLANT
ELECT BLADE 6.5 EXT (BLADE) IMPLANT
ELECTRODE BLDE 4.0 EZ CLN MEGD (MISCELLANEOUS) ×1 IMPLANT
ELECTRODE REM PT RTRN 9FT ADLT (ELECTROSURGICAL) ×1 IMPLANT
FACESHIELD WRAPAROUND OR TEAM (MASK) ×2 IMPLANT
GLOVE BIOGEL PI IND STRL 8 (GLOVE) ×2 IMPLANT
GLOVE ECLIPSE 8.0 STRL XLNG CF (GLOVE) ×1 IMPLANT
GLOVE ORTHO TXT STRL SZ7.5 (GLOVE) ×2 IMPLANT
GOWN STRL REUS W/ TWL LRG LVL3 (GOWN DISPOSABLE) ×2 IMPLANT
GOWN STRL REUS W/ TWL XL LVL3 (GOWN DISPOSABLE) ×2 IMPLANT
HEAD CERAMIC 36 PLUS 8.5 12 14 (Hips) IMPLANT
KIT BASIN OR (CUSTOM PROCEDURE TRAY) ×1 IMPLANT
KIT TURNOVER KIT B (KITS) ×1 IMPLANT
LINER NEUTRAL 62MMC36MM P4 (Liner) IMPLANT
MANIFOLD NEPTUNE II (INSTRUMENTS) ×1 IMPLANT
PACK TOTAL JOINT (CUSTOM PROCEDURE TRAY) ×1 IMPLANT
PAD ARMBOARD POSITIONER FOAM (MISCELLANEOUS) ×1 IMPLANT
SET HNDPC FAN SPRY TIP SCT (DISPOSABLE) ×1 IMPLANT
SOLN 0.9% NACL POUR BTL 1000ML (IV SOLUTION) ×1 IMPLANT
SOLN STERILE WATER BTL 1000 ML (IV SOLUTION) ×2 IMPLANT
STAPLER SKIN PROX 35W (STAPLE) IMPLANT
STEM FEMORAL SZ5 HIGH ACTIS (Stem) IMPLANT
STRIP CLOSURE SKIN 1/2X4 (GAUZE/BANDAGES/DRESSINGS) ×2 IMPLANT
SUT ETHIBOND NAB CT1 #1 30IN (SUTURE) ×1 IMPLANT
SUT MNCRL AB 4-0 PS2 18 (SUTURE) IMPLANT
SUT VIC AB 0 CT1 27XBRD ANBCTR (SUTURE) ×1 IMPLANT
SUT VIC AB 1 CT1 27XBRD ANBCTR (SUTURE) ×1 IMPLANT
SUT VIC AB 2-0 CT1 TAPERPNT 27 (SUTURE) ×1 IMPLANT
TOWEL GREEN STERILE (TOWEL DISPOSABLE) ×1 IMPLANT
TOWEL GREEN STERILE FF (TOWEL DISPOSABLE) ×1 IMPLANT
TRAY FOLEY W/BAG SLVR 16FR ST (SET/KITS/TRAYS/PACK) IMPLANT

## 2023-11-12 NOTE — Transfer of Care (Signed)
 Immediate Anesthesia Transfer of Care Note  Patient: Brandon Finley  Procedure(s) Performed: ARTHROPLASTY, HIP, TOTAL, ANTERIOR APPROACH (Left: Hip)  Patient Location: PACU  Anesthesia Type:Spinal  Level of Consciousness: awake, alert , and oriented  Airway & Oxygen Therapy: Patient Spontanous Breathing and Patient connected to nasal cannula oxygen  Post-op Assessment: Report given to RN and Post -op Vital signs reviewed and stable  Post vital signs: Reviewed and stable  Last Vitals:  Vitals Value Taken Time  BP    Temp    Pulse    Resp    SpO2      Last Pain:  Vitals:   11/12/23 0620  TempSrc:   PainSc: 0-No pain         Complications: No notable events documented.

## 2023-11-12 NOTE — Interval H&P Note (Signed)
 History and Physical Interval Note: The patient understands that he is seen today for a left total hip replacement to treat his significant left hip pain and arthritis.  There has been no acute or interval change in his medical status.  The risks and benefits of surgery have been discussed in detail and informed consent has been obtained.  The left operative hip has been marked.  11/12/2023 7:07 AM  Brandon Finley  has presented today for surgery, with the diagnosis of osteoarthritis left hip.  The various methods of treatment have been discussed with the patient and family. After consideration of risks, benefits and other options for treatment, the patient has consented to  Procedure(s): ARTHROPLASTY, HIP, TOTAL, ANTERIOR APPROACH (Left) as a surgical intervention.  The patient's history has been reviewed, patient examined, no change in status, stable for surgery.  I have reviewed the patient's chart and labs.  Questions were answered to the patient's satisfaction.     [name]

## 2023-11-12 NOTE — Discharge Instructions (Signed)

## 2023-11-12 NOTE — Op Note (Signed)
 Operative Note  Date of operation: 11/12/2023 Preoperative diagnosis: Left hip primary osteoarthritis Postoperative diagnosis: Same  Procedure: Left direct anterior total hip arthroplasty  Implants: Implant Name Type Inv. Item Serial No. Manufacturer Lot No. LRB No. Used Action  CUP LINZIE LEARN - [id number] Orthopedic Implant CUP GRIPTION SECTOR  DEPUY ORTHOPAEDICS [id number] Left 1 Implanted  LINER NEUTRAL [id number] P4 - [id number] Liner LINER NEUTRAL [id number] P4  DEPUY ORTHOPAEDICS [id number] Left 1 Implanted  STEM FEMORAL SZ5 HIGH ACTIS - [id number] Stem STEM FEMORAL SZ5 HIGH ACTIS  DEPUY ORTHOPAEDICS [id number] Left 1 Implanted  HEAD CERAMIC 36 PLUS 8.5 12 14  - [id number] Hips HEAD CERAMIC 36 PLUS 8.5 12 14   DEPUY ORTHOPAEDICS [id number] Left 1 Implanted   Surgeon: Lonni GRADE. Vernetta, MD Assistant: Tory Gaskins, PA-C  Anesthesia: Spinal Antibiotics: 3 g IV Ancef  EBL: 500 cc Complications: None  Indications: The patient is a 63 year old gentleman with debilitating arthritis involving his left hip this and well-documented.  We actually replaced his right hip back in 2018 due to similar severe arthritis.  At this point his left hip pain is daily and it is detrimentally affecting his mobility, his quality of life and his actives daily living to the point he does wish to proceed with hip replacement on the left side.  Having has before the right side he is fully aware of the risks of acute blood loss anemia, nerve or vessel injury, fracture, infection, DVT, dislocation, implant failure, leg length differences and wound healing issues.  He understands that our goals are hopefully decreased pain, improved mobility and improve quality of life.  Procedure description: After informed consent was obtained and the appropriate left hip was marked, the patient was brought to the operating room and set up in the stretcher where spinal anesthesia was obtained.  He was then laid in spine position  on stretcher and a Foley catheter was placed.  Traction boots were then placed on both his feet and next he was placed supine on the Hana fracture table with a perineal post and placed in both legs and inline skeletal traction by sitting no traction applied.  The left operative hip and pelvis were assessed radiographically.  The left hip was prepped and draped with DuraPrep sterile drapes.  Timeout was called and he was identified as the correct patient and correct the left hip.  An incision was then made just inferior and posterior to the ASIS and carried slightly obliquely down the leg.  Dissection was carried down to the tensor fascia lata muscle and tensor fascia was then divided longitudinally to proceed with a direct and traverse the hip.  Circumflex vessels were identified cauterized.  The hip capsule identified and opened up in L-type format finding a moderate joint effusion.  Cobra retractors were placed around the medial and lateral femoral neck and a femoral neck cut was made with an oscillating saw just proximal to the lesser trochanter.  This cut was completed with an osteotome.  A corkscrew was placed in the femoral head and the femoral head was removed in its entirety.  It was a very large femoral head and had a wide area devoid of cartilage.  A bent Hohmann was then placed over the medial acetabular rim and remanence of the acetabular labrum and other debris removed.  Reaming was initiated from a size 43 reamer and stepwise increments going all the way up to a size 61 reamer with all reamers placed under direct visualization and the last reamer  also placed under direct fluoroscopy in order to obtain the depth of reaming, the inclination and the anteversion.  The real DePuy sector GRIPTION asked our component size 62 was then placed without difficulty followed by a 36+4 polythene liner for that size 62 acetabular component.  Attention was then turned to the femur.  With the left leg externally rotated  to 120 degrees, extended and adducted, a Mueller retractors placed medially and a Hohmann retractor on the greater trochanter.  The lateral joint capsule was released and a box cutting osteotome was used to enter the femoral canal.  Broaching was initiated using the Actis broaching system from a size 0 going to a size 5.  With a size 5 in place we trialed a high offset femoral neck and a 36-2 trial head ball.  The left leg was brought over and up and with traction and internal rotation reduced in the pelvis.  We definitely need a more offset and leg length based on radiographic and clinical assessment.  We dislocated the hip of the trial components.  We then placed the real Actis femoral component with high offset size 5 and with the real 36+8.5 ceramic head ball.  Again this was reduced in the pelvis and was very tight and had good range of motion and we did increase his leg length as well as offset.  We assessed it clinically and radiographically.  The soft tissue was then irrigated with normal sick solution.  A small amount of joint capsule was closed with interrupted #1 Ethibond suture followed by #1 Vicryl to close the tensor fascia.  0 Vicryl was used to close deep tissue and 2-0 Vicryl was used to close subcutaneous tissue.  The skin was closed with staples.  An Aquacel dressing was applied.  The patient was taken off the Hana table and taken recovery room.  Tory Gaskins, PA-C did assist in the entire case and beginning and his assistance was crucial and medically necessary for soft tissue management and retraction, helping guide implant placement and a layered closure of the wound.

## 2023-11-12 NOTE — Anesthesia Procedure Notes (Signed)
 Spinal  Patient location during procedure: OR Start time: 11/12/2023 7:37 AM End time: 11/12/2023 7:41 AM Reason for block: surgical anesthesia Staffing Anesthesiologist: Jerrye Sharper, MD Performed by: Jerrye Sharper, MD Authorized by: Jerrye Sharper, MD   Preanesthetic Checklist Completed: patient identified, IV checked, site marked, risks and benefits discussed, surgical consent, monitors and equipment checked, pre-op evaluation and timeout performed Spinal Block Patient position: sitting Prep: DuraPrep and site prepped and draped Patient monitoring: heart rate, cardiac monitor, continuous pulse ox and blood pressure Approach: midline Location: L3-4 Injection technique: single-shot Needle Needle type: Pencan  Needle gauge: 24 G Needle length: 9 cm Needle insertion depth: 7 cm Assessment Sensory level: T6 Events: CSF return Additional Notes Patient tolerated procedure well. Adequate sensory level.

## 2023-11-12 NOTE — Anesthesia Postprocedure Evaluation (Signed)
 Anesthesia Post Note  Patient: Brandon Finley  Procedure(s) Performed: ARTHROPLASTY, HIP, TOTAL, ANTERIOR APPROACH (Left: Hip)     Patient location during evaluation: PACU Anesthesia Type: Spinal Level of consciousness: oriented and awake and alert Pain management: pain level controlled Vital Signs Assessment: post-procedure vital signs reviewed and stable Respiratory status: respiratory function stable, nonlabored ventilation and spontaneous breathing Cardiovascular status: blood pressure returned to baseline and stable Postop Assessment: no headache, no backache, no apparent nausea or vomiting, spinal receding and patient able to bend at knees Anesthetic complications: no   No notable events documented.  Last Vitals:  Vitals:   11/12/23 1030 11/12/23 1045  BP: 116/76 122/75  Pulse: (!) 57 (!) 56  Resp: 20 19  Temp:    SpO2: 95% 97%    Last Pain:  Vitals:   11/12/23 1045  TempSrc:   PainSc: Asleep                 Kahmari Herard A.

## 2023-11-12 NOTE — Evaluation (Signed)
 Physical Therapy Evaluation Patient Details Name: Brandon Finley MRN: [id number] DOB: Oct 04, 1960 Today's Date: 11/12/2023  History of Present Illness  63 y.o. male admitted 10/28 for and underwent  Left direct anterior total hip arthroplasty. PMH: DM, HTN, arthritis, PNA.  Clinical Impression  Pt is s/p left direct anterior THA presenting with the deficits listed below (see PT Problem List). Previously independent. States he had Rt THA about 8 years ago and recovered well with no current issues on that side. Lives on 3rd floor of an apartment but there is an engineer, structural that typically works. Has a brother near by but would be of limited help at d/c. During evaluation required min assist to rise to EOB and CGA to stand and ambulate with the aid of a rolling walker. Managed to ambulate about 90 feet before needing to sit down due to fatigue and pain of the left hip. Education completed, handouts provided. Anticipate good functional recovery. Will progress as tolerated during acute admission. It may be beneficial to make sure patient can navigate steps safely in the event that his elevator breaks down at apartment, as it sounds like this has happened before. Pt will benefit from acute skilled PT to increase their independence and safety with mobility to facilitate discharge.          If plan is discharge home, recommend the following: Assistance with cooking/housework;Assist for transportation   Can travel by private vehicle        Equipment Recommendations BSC/3in1;Rolling walker (2 wheels)  Recommendations for Other Services       Functional Status Assessment Patient has had a recent decline in their functional status and demonstrates the ability to make significant improvements in function in a reasonable and predictable amount of time.     Precautions / Restrictions Precautions Precautions: Fall Recall of Precautions/Restrictions: Intact Restrictions Weight Bearing Restrictions Per  Provider Order: No      Mobility  Bed Mobility Overal bed mobility: Needs Assistance Bed Mobility: Supine to Sit     Supine to sit: Min assist     General bed mobility comments: Min assist to pull up through therpist's hand final distance. Able to scoot without assist.    Transfers Overall transfer level: Needs assistance Equipment used: Rolling walker (2 wheels) Transfers: Sit to/from Stand Sit to Stand: Contact guard assist           General transfer comment: CGA for safety, cues for hand placement and technique.    Ambulation/Gait Ambulation/Gait assistance: Contact guard assist Gait Distance (Feet): 90 Feet Assistive device: Rolling walker (2 wheels) Gait Pattern/deviations: Step-through pattern, Decreased stride length, Decreased stance time - left, Antalgic, Trunk flexed Gait velocity: dec Gait velocity interpretation: <1.31 ft/sec, indicative of household ambulator   General Gait Details: Educated on safe AD use with RW for support. No buckling or overt LOB with this device. Moderately antalgic with cues for increased UE use and upright posture to unload as needed which improves symmetry but fatigues towards end of distance and required to sit. Good awareness.  Stairs            Wheelchair Mobility     Tilt Bed    Modified Rankin (Stroke Patients Only)       Balance Overall balance assessment: Needs assistance Sitting-balance support: No upper extremity supported, Feet supported Sitting balance-Leahy Scale: Good     Standing balance support: Single extremity supported Standing balance-Leahy Scale: Poor  Pertinent Vitals/Pain Pain Assessment Pain Assessment: Faces Faces Pain Scale: Hurts whole lot Pain Location: Lt hip Pain Descriptors / Indicators: Aching, Operative site guarding Pain Intervention(s): Limited activity within patient's tolerance, Monitored during session, Repositioned    Home  Living Family/patient expects to be discharged to:: Private residence Living Arrangements: Alone Available Help at Discharge: Family;Available PRN/intermittently (Brother *very limited) Type of Home: Apartment Home Access: Elevator       Home Layout:  (Lives on 3rd floor of apt) Home Equipment: None      Prior Function Prior Level of Function : Independent/Modified Independent             Mobility Comments: Ind ADLs Comments: Ind     Extremity/Trunk Assessment   Upper Extremity Assessment Upper Extremity Assessment: Defer to OT evaluation    Lower Extremity Assessment Lower Extremity Assessment: LLE deficits/detail LLE Deficits / Details: Post op weakness/guarding as expected       Communication   Communication Communication: Impaired Factors Affecting Communication: Reduced clarity of speech    Cognition Arousal: Alert Behavior During Therapy: WFL for tasks assessed/performed   PT - Cognitive impairments: No apparent impairments                         Following commands: Intact       Cueing Cueing Techniques: Verbal cues, Gestural cues     General Comments General comments (skin integrity, edema, etc.): Educated on safety, precautions, LE exercises, activity modificiation, and modalities for pain.    Exercises Total Joint Exercises Ankle Circles/Pumps: AROM, Both, 10 reps, Seated Quad Sets: Strengthening, Both, 10 reps, Seated Short Arc Quad: AAROM, Strengthening, Left, 5 reps, Seated Heel Slides: AAROM, Left, 5 reps, Seated   Assessment/Plan    PT Assessment Patient needs continued PT services  PT Problem List Decreased strength;Decreased range of motion;Decreased activity tolerance;Decreased balance;Decreased mobility;Decreased knowledge of precautions;Pain       PT Treatment Interventions DME instruction;Gait training;Stair training;Functional mobility training;Therapeutic activities;Therapeutic exercise;Balance  training;Neuromuscular re-education;Patient/family education;Modalities    PT Goals (Current goals can be found in the Care Plan section)  Acute Rehab PT Goals Patient Stated Goal: Get well return home with some help PT Goal Formulation: With patient Time For Goal Achievement: 11/26/23 Potential to Achieve Goals: Good    Frequency 7X/week     Co-evaluation               AM-PAC PT 6 Clicks Mobility  Outcome Measure Help needed turning from your back to your side while in a flat bed without using bedrails?: None Help needed moving from lying on your back to sitting on the side of a flat bed without using bedrails?: A Little Help needed moving to and from a bed to a chair (including a wheelchair)?: A Little Help needed standing up from a chair using your arms (e.g., wheelchair or bedside chair)?: A Little Help needed to walk in hospital room?: A Little Help needed climbing 3-5 steps with a railing? : A Little 6 Click Score: 19    End of Session Equipment Utilized During Treatment: Gait belt Activity Tolerance: Patient tolerated treatment well Patient left: in chair;with call bell/phone within reach Nurse Communication: Mobility status PT Visit Diagnosis: Unsteadiness on feet (R26.81);Other abnormalities of gait and mobility (R26.89);Muscle weakness (generalized) (M62.81);Difficulty in walking, not elsewhere classified (R26.2);Pain Pain - Right/Left: Left Pain - part of body: Hip    Time: 8396-8380 PT Time Calculation (min) (ACUTE ONLY): 16 min  Charges:   PT Evaluation $PT Eval Low Complexity: 1 Low   PT General Charges $$ ACUTE PT VISIT: 1 Visit         Leontine Roads, PT, DPT [facility]  Rehabilitation Services Physical Therapist Office: [phone] Website: [email or web address]   Leontine GORMAN Roads 11/12/2023, 5:09 PM

## 2023-11-13 ENCOUNTER — Other Ambulatory Visit (HOSPITAL_COMMUNITY): Payer: Self-pay

## 2023-11-13 DIAGNOSIS — M1612 Unilateral primary osteoarthritis, left hip: Secondary | ICD-10-CM | POA: Diagnosis not present

## 2023-11-13 LAB — GLUCOSE, CAPILLARY
Glucose-Capillary: 182 mg/dL — ABNORMAL HIGH (ref 70–99)
Glucose-Capillary: 170 mg/dL — ABNORMAL HIGH (ref 70–99)

## 2023-11-13 LAB — CBC
HCT: 34.5 % — ABNORMAL LOW (ref 39.0–52.0)
Hemoglobin: 12.3 g/dL — ABNORMAL LOW (ref 13.0–17.0)
MCH: 31.9 pg (ref 26.0–34.0)
MCHC: 35.7 g/dL (ref 30.0–36.0)
MCV: 89.6 fL (ref 80.0–100.0)
Platelets: 187 K/uL (ref 150–400)
RBC: 3.85 MIL/uL — ABNORMAL LOW (ref 4.22–5.81)
RDW: 11.2 % — ABNORMAL LOW (ref 11.5–15.5)
WBC: 15.3 K/uL — ABNORMAL HIGH (ref 4.0–10.5)
nRBC: 0 % (ref 0.0–0.2)

## 2023-11-13 LAB — BASIC METABOLIC PANEL WITH GFR
Anion gap: 9 (ref 5–15)
BUN: 6 mg/dL — ABNORMAL LOW (ref 8–23)
CO2: 25 mmol/L (ref 22–32)
Calcium: 9 mg/dL (ref 8.9–10.3)
Chloride: 94 mmol/L — ABNORMAL LOW (ref 98–111)
Creatinine, Ser: 0.62 mg/dL (ref 0.61–1.24)
GFR, Estimated: >60 mL/min (ref >60)
Glucose, Bld: 224 mg/dL — ABNORMAL HIGH (ref 70–99)
Potassium: 3.6 mmol/L (ref 3.5–5.1)
Sodium: 128 mmol/L — ABNORMAL LOW (ref 135–145)

## 2023-11-13 MED ORDER — TIZANIDINE HCL 4 MG PO TABS
4.0000 mg | ORAL_TABLET | Freq: Four times a day (QID) | ORAL | 1 refills | Status: AC | PRN
  Filled 2023-11-13: qty 30, 8d supply, fill #0

## 2023-11-13 MED ORDER — OXYCODONE HCL 5 MG PO TABS
5.0000 mg | ORAL_TABLET | ORAL | 0 refills | Status: DC | PRN
  Filled 2023-11-13: qty 30, 3d supply, fill #0

## 2023-11-13 MED ORDER — ASPIRIN 81 MG PO CHEW
81.0000 mg | CHEWABLE_TABLET | Freq: Two times a day (BID) | ORAL | 0 refills | Status: AC
  Filled 2023-11-13: qty 30, 15d supply, fill #0

## 2023-11-13 NOTE — Progress Notes (Signed)
 Subjective: 1 Day Post-Op Procedure(s) (LRB): ARTHROPLASTY, HIP, TOTAL, ANTERIOR APPROACH (Left) Patient reports pain as moderate.  Does report a tough night on pain and no sleep.    Objective: Vital signs in last 24 hours: Temp:  [97.8 F (36.6 C)-98.8 F (37.1 C)] 97.8 F (36.6 C) (10/29 0425) Pulse Rate:  [56-96] 96 (10/29 0425) Resp:  [14-25] 20 (10/29 0425) BP: (100-183)/(64-106) 159/98 (10/29 0425) SpO2:  [94 %-100 %] 96 % (10/29 0425)  Intake/Output from previous day: 10/28 0701 - 10/29 0700 In: 2230 [P.O.:480; I.V.:900; IV Piggyback:850] Out: 1675 [Urine:1175; Blood:500] Intake/Output this shift: No intake/output data recorded.  No results for input(s): HGB in the last 72 hours. No results for input(s): WBC, RBC, HCT, PLT in the last 72 hours. No results for input(s): NA, K, CL, CO2, BUN, CREATININE, GLUCOSE, CALCIUM  in the last 72 hours. No results for input(s): LABPT, INR in the last 72 hours.  Sensation intact distally Intact pulses distally Dorsiflexion/Plantar flexion intact Incision: dressing C/D/I   Assessment/Plan: 1 Day Post-Op Procedure(s) (LRB): ARTHROPLASTY, HIP, TOTAL, ANTERIOR APPROACH (Left) Up with therapy Discharge home with home health this afternoon.      Brandon Finley 11/13/2023, 7:40 AM

## 2023-11-13 NOTE — Discharge Summary (Signed)
 Patient ID: Brandon Finley MRN: [id number] DOB/AGE: [birth year] 63 y.o.  Admit date: 11/12/2023 Discharge date: 11/13/2023  Admission Diagnoses:  Principal Problem:   Unilateral primary osteoarthritis, left hip Active Problems:   Status post total replacement of left hip   Discharge Diagnoses:  Same  Past Medical History:  Diagnosis Date   Arthritis    Diabetes mellitus without complication (HCC)    Hypertension    Pneumonia    when pt was 63 years old    Surgeries: Procedure(s): ARTHROPLASTY, HIP, TOTAL, ANTERIOR APPROACH on 11/12/2023   Consultants:   Discharged Condition: Improved  Hospital Course: Brandon Finley is an 63 y.o. male who was admitted 11/12/2023 for operative treatment ofUnilateral primary osteoarthritis, left hip. Patient has severe unremitting pain that affects sleep, daily activities, and work/hobbies. After pre-op clearance the patient was taken to the operating room on 11/12/2023 and underwent  Procedure(s): ARTHROPLASTY, HIP, TOTAL, ANTERIOR APPROACH.    Patient was given perioperative antibiotics:  Anti-infectives (From admission, onward)    Start     Dose/Rate Route Frequency Ordered Stop   11/12/23 1445  ceFAZolin  (ANCEF ) IVPB 2g/100 mL premix        2 g 200 mL/hr over 30 Minutes Intravenous Every 6 hours 11/12/23 1357 11/12/23 2127   11/12/23 0600  ceFAZolin  (ANCEF ) IVPB 3g/150 mL premix        3 g 300 mL/hr over 30 Minutes Intravenous On call to O.R. 11/12/23 0551 11/12/23 0745        Patient was given sequential compression devices, early ambulation, and chemoprophylaxis to prevent DVT.  Inpatient Morphine Milligram Equivalents Per Day 10/28 - 10/29   Values displayed are in units of MME/Day    Order Start / End Date Yesterday Today    oxyCODONE  (Oxy IR/ROXICODONE ) immediate release tablet 5 mg 10/28 - 10/28 0 of Unknown --    oxyCODONE  (ROXICODONE ) 5 MG/5ML solution 5 mg 10/28 - 10/28 0 of Unknown --      Group total: 0 of  Unknown     HYDROmorphone  (DILAUDID ) injection 0.25-0.5 mg 10/28 - 10/28 30 of 40-80 --    Daily Totals  30 of Unknown (at least 40-80) --    Calculation Errors     Order Type Date Details   oxyCODONE  (Oxy IR/ROXICODONE ) immediate release tablet 5 mg Ordered Dose -- Insufficient frequency information   oxyCODONE  (ROXICODONE ) 5 MG/5ML solution 5 mg Ordered Dose -- Insufficient frequency information            Patient benefited maximally from hospital stay and there were no complications.    Recent vital signs: Patient Vitals for the past 24 hrs:  BP Temp Temp src Pulse Resp SpO2  11/13/23 0818 123/74 98.4 F (36.9 C) Oral 97 18 100 %  11/13/23 0425 (!) 159/98 97.8 F (36.6 C) Oral 96 20 96 %  11/12/23 2354 (!) 173/95 98.1 F (36.7 C) Oral 90 20 100 %     Recent laboratory studies:  Recent Labs    11/13/23 0804  WBC 15.3*  HGB 12.3*  HCT 34.5*  PLT 187  NA 128*  K 3.6  CL 94*  CO2 25  BUN 6*  CREATININE 0.62  GLUCOSE 224*  CALCIUM  9.0     Discharge Medications:   Allergies as of 11/13/2023   No Known Allergies      Medication List     STOP taking these medications    aspirin  EC 81 MG tablet Replaced by: Aspirin  Low  Dose 81 MG chewable tablet       TAKE these medications    Accu-Chek Guide test strip Generic drug: glucose blood Use as instructed   Accu-Chek Guide w/Device Kit use to Test blood sugar  3 times daily   Accu-Chek Softclix Lancets lancets Use as instructed   Accu-Chek Softclix Lancets lancets Use as instructed 3 times daily before meals   Aspirin  Low Dose 81 MG chewable tablet Generic drug: aspirin  Chew 1 tablet (81 mg total) by mouth 2 (two) times daily. Replaces: aspirin  EC 81 MG tablet   GAVILYTE-N  WITH FLAVOR PACK PO   glipiZIDE  10 MG tablet Commonly known as: GLUCOTROL  TAKE 1 TABLET (10 MG TOTAL) BY MOUTH 2 (TWO) TIMES DAILY BEFORE A MEAL.   lisinopril -hydrochlorothiazide  10-12.5 MG tablet Commonly known as:  ZESTORETIC  Take 1 tablet by mouth every day   nabumetone  750 MG tablet Commonly known as: RELAFEN  Take 1 tablet by mouth twice daily as needed   oxyCODONE  5 MG immediate release tablet Commonly known as: Oxy IR/ROXICODONE  Take 1-2 tablets (5-10 mg total) by mouth every 4 (four) hours as needed for moderate pain (pain score 4-6) (pain score 4-6).   Ozempic  (1 MG/DOSE) 4 MG/3ML Sopn Generic drug: Semaglutide  (1 MG/DOSE) Inject 1 mg as directed once a week.   rosuvastatin  20 MG tablet Commonly known as: CRESTOR  TAKE 1 TABLET (20 MG TOTAL) BY MOUTH DAILY.   tiZANidine 4 MG tablet Commonly known as: Zanaflex Take 1 tablet (4 mg total) by mouth every 6 (six) hours as needed for muscle spasms.   traMADol  50 MG tablet Commonly known as: ULTRAM  Take 2 tablets (100 mg total) by mouth every 12 (twelve) hours as needed.   TRUEplus Pen Needles 32G X 4 MM Misc Generic drug: Insulin  Pen Needle Use to inject insulin  and Victoza  twice daily   Vitamin D  (Ergocalciferol ) 1.25 MG (50000 UNIT) Caps capsule Commonly known as: DRISDOL Take 50,000 Units by mouth once a week.               Durable Medical Equipment  (From admission, onward)           Start     Ordered   11/12/23 1358  DME 3 n 1  Once        11/12/23 1357   11/12/23 1358  DME Walker rolling  Once       Question Answer Comment  Walker: With 5 Inch Wheels   Patient needs a walker to treat with the following condition Status post total replacement of left hip      11/12/23 1357            Diagnostic Studies: DG HIP UNILAT WITH PELVIS 1V LEFT Result Date: 11/12/2023 EXAM: 3 SPOT FILMS XRAY OF THE LEFT HIP 11/12/2023 08:59:46 AM COMPARISON: None available. CLINICAL HISTORY: Elective surgery ARTHROPLASTY, HIP, TOTAL, ANTERIOR APPROACH (Left: Hip); RSTO: WB; Fluoro Time: 22.4 secs; mGy: 5.30 FINDINGS: BONES AND JOINTS: New left hip prosthesis is noted in satisfactory position. SOFT TISSUES: The soft tissues are  unremarkable. IMPRESSION: 1. New left hip prosthesis in satisfactory position. Electronically signed by: Oneil Devonshire MD 11/12/2023 10:02 AM EDT RP Workstation: [id number]   DG Pelvis Portable Result Date: 11/12/2023 EXAM: 1 or 2 VIEW(S) XRAY OF THE PELVIS 11/12/2023 09:51:00 AM COMPARISON: 03/13/2016, intraoperative films from earlier in the same day. CLINICAL HISTORY: Status post total replacement of left hip [id number]. FINDINGS: BONES AND JOINTS: Left hip arthroplasty in place. Right hip arthroplasty in anatomic  alignment. SOFT TISSUES: Overlying skin staples and soft tissue air. IMPRESSION: 1. Status post left and right hip arthroplasties with anatomic alignment. Electronically signed by: Oneil Devonshire MD 11/12/2023 10:01 AM EDT RP Workstation: [id number]   DG C-Arm 1-60 Min-No Report Result Date: 11/12/2023 Fluoroscopy was utilized by the requesting physician.  No radiographic interpretation.    Disposition: Discharge disposition: 01-Home or Self Care          Follow-up Information     [facility] Follow up.   Specialty: Home Health Services Why: [facility] will contact you for the first home visit. Contact information: [address] [place] [address] [phone]         Vernetta Lonni GRADE, MD Follow up in 2 week(s).   Specialty: Orthopedic Surgery Contact information: [address] [place] [address] [phone]                  Signed: Lonni GRADE Vernetta 11/13/2023, 10:01 PM

## 2023-11-13 NOTE — Progress Notes (Signed)
 Physical Therapy Treatment Patient Details Name: Brandon Finley MRN: [id number] DOB: 10/17/1960 Today's Date: 11/13/2023   History of Present Illness 63 y.o. male admitted 10/28 for and underwent  Left direct anterior total hip arthroplasty. PMH: DM, HTN, arthritis, PNA.    PT Comments  Throughout session today, pt demonstrated increased focus on L hip pain despite attempts to distract. Able to ambulate 84' with RW and CGA. Required repetitive verbal cueing for proper gait sequencing with RW and good posture to avoid excessive stress on B hips. Did not perform stair training today as pt adamant he will not need to navigate stairs at home. Performed exercises from handout, which pt described compliance with. Acute PT to follow to increase independence and safety with ambulation.     If plan is discharge home, recommend the following: Assistance with cooking/housework;Assist for transportation   Can travel by private vehicle        Equipment Recommendations  BSC/3in1;Rolling walker (2 wheels)    Recommendations for Other Services       Precautions / Restrictions Precautions Precautions: Fall Recall of Precautions/Restrictions: Intact Restrictions Weight Bearing Restrictions Per Provider Order: No     Mobility  Bed Mobility Overal bed mobility: Needs Assistance Bed Mobility: Supine to Sit     Supine to sit: Min assist     General bed mobility comments: Required minA for moving L leg across bed and over EOB.    Transfers Overall transfer level: Needs assistance Equipment used: Rolling walker (2 wheels) Transfers: Sit to/from Stand Sit to Stand: Min assist, Contact guard assist           General transfer comment: Pt required minA to power to stand, and verbal cues for good posture in standing.    Ambulation/Gait Ambulation/Gait assistance: Contact guard assist Gait Distance (Feet): 80 Feet Assistive device: Rolling walker (2 wheels) Gait Pattern/deviations:  Step-through pattern, Decreased stride length, Decreased stance time - left, Antalgic, Trunk flexed Gait velocity: dec Gait velocity interpretation: <1.31 ft/sec, indicative of household ambulator   General Gait Details: Pt required mod verbal cues for proper use of RW and sequencing of gait. Initially, pt demonstrated flexed trunk, tendency to push RW ahead of himself, and antalgic step through gait pattern. Cued pt to push RW, then step with L foot, and bring R foot even with L. Also cued pt to stand tall during gait with goal of taking stress off hips. Attempted stair training as pt previously told therapist the elevator at his apartment frequently breaks down. Upon approaching steps, pt was adamant that the elevator no longer breaks down and that he would not need to navgiate stairs. Did not complete stair training.   Stairs             Wheelchair Mobility     Tilt Bed    Modified Rankin (Stroke Patients Only)       Balance Overall balance assessment: Needs assistance Sitting-balance support: No upper extremity supported, Feet supported Sitting balance-Leahy Scale: Good Sitting balance - Comments: Demonstrated good static balance with feet supported.   Standing balance support: Bilateral upper extremity supported, Reliant on assistive device for balance, During functional activity Standing balance-Leahy Scale: Poor                              Communication Communication Communication: No apparent difficulties  Cognition Arousal: Alert Behavior During Therapy: WFL for tasks assessed/performed   PT - Cognitive impairments: No  apparent impairments                       PT - Cognition Comments: Often required repeat cueing for proper gait form with RW. Pt slightly impulsive, and states he rushed to the bathroom last night. Discussed not waiting to go to the bathroom as long so he can take his time walking. Following commands: Intact      Cueing  Cueing Techniques: Verbal cues, Gestural cues  Exercises Total Joint Exercises Ankle Circles/Pumps: AROM, Both, 10 reps, Supine Quad Sets: AROM, Strengthening, Both, 10 reps Short Arc Quad: AROM, Strengthening, Both, 10 reps, Supine Hip ABduction/ADduction: AAROM, Strengthening, Both, 10 reps, Supine Long Arc Quad: AROM, Strengthening, Both, 10 reps, Seated    General Comments General comments (skin integrity, edema, etc.): Pt frequently c/o soreness throughout session today, and required frequent verbal cueing for proper gait pattern.      Pertinent Vitals/Pain Pain Assessment Pain Assessment: Faces Faces Pain Scale: Hurts whole lot Pain Descriptors / Indicators: Aching, Operative site guarding Pain Intervention(s): Limited activity within patient's tolerance, Monitored during session    Home Living                          Prior Function            PT Goals (current goals can now be found in the care plan section) Acute Rehab PT Goals PT Goal Formulation: With patient Time For Goal Achievement: 11/26/23 Potential to Achieve Goals: Good Progress towards PT goals: Progressing toward goals    Frequency    7X/week      PT Plan      Co-evaluation              AM-PAC PT 6 Clicks Mobility   Outcome Measure  Help needed turning from your back to your side while in a flat bed without using bedrails?: None Help needed moving from lying on your back to sitting on the side of a flat bed without using bedrails?: A Little Help needed moving to and from a bed to a chair (including a wheelchair)?: A Little Help needed standing up from a chair using your arms (e.g., wheelchair or bedside chair)?: A Little Help needed to walk in hospital room?: A Little Help needed climbing 3-5 steps with a railing? : A Little 6 Click Score: 19    End of Session Equipment Utilized During Treatment: Gait belt Activity Tolerance: Patient tolerated treatment well Patient  left: in bed;with call bell/phone within reach Nurse Communication: Mobility status PT Visit Diagnosis: Unsteadiness on feet (R26.81);Other abnormalities of gait and mobility (R26.89);Muscle weakness (generalized) (M62.81);Difficulty in walking, not elsewhere classified (R26.2);Pain Pain - Right/Left: Left Pain - part of body: Hip     Time: 9064-8997 PT Time Calculation (min) (ACUTE ONLY): 27 min  Charges:      PT General Charges $$ ACUTE PT VISIT: 1 Visit                     Suhailah Kwan, SPT    Riverlyn Kizziah 11/13/2023, 11:22 AM

## 2023-11-13 NOTE — Progress Notes (Signed)
 Physical Therapy Treatment Patient Details Name: Brandon Finley MRN: [id number] DOB: 04/12/1960 Today's Date: 11/13/2023   History of Present Illness 63 y.o. male admitted 10/28 for and underwent  Left direct anterior total hip arthroplasty. PMH: DM, HTN, arthritis, PNA.    PT Comments  Pt received in supine and agreeable to PT session. Able to perform bed mobility modI today and able to teach back using gait belt to mobilize L LE as needed. Pt demonstrated inc carryover regarding education on gait sequencing and form and proper posture in standing from this morning's session. Still required min verbal cueing to stand tall with gait, which improved upon adjusting height of RW. Able to ambulate 91' with RW and supervision/CGA. MinA required for donning personal clothing at end of session, namely for getting briefs and pants over feet. Follow physician's d/c orders for follow up PT.    If plan is discharge home, recommend the following: Assistance with cooking/housework;Assist for transportation   Can travel by private vehicle        Equipment Recommendations  BSC/3in1;Rolling walker (2 wheels)    Recommendations for Other Services       Precautions / Restrictions Precautions Precautions: Fall Recall of Precautions/Restrictions: Intact Restrictions Weight Bearing Restrictions Per Provider Order: No     Mobility  Bed Mobility Overal bed mobility: Needs Assistance Bed Mobility: Supine to Sit     Supine to sit: Modified independent (Device/Increase time)     General bed mobility comments: Able to perform supine HOB elevated>sit without physical assist, just requiring increased time. Discussed with pt using gait belt to move L LE as needed, and he verbalized understanding.    Transfers Overall transfer level: Needs assistance Equipment used: Rolling walker (2 wheels) Transfers: Sit to/from Stand Sit to Stand: Supervision, Contact guard assist           General  transfer comment: Pt able to stand without physical assist, and did not demonstrate LOB. Required cueing to stand tall and tuck butt under him.    Ambulation/Gait Ambulation/Gait assistance: Supervision, Contact guard assist Gait Distance (Feet): 30 Feet (x2) Assistive device: Rolling walker (2 wheels) Gait Pattern/deviations: Step-through pattern, Decreased step length - left, Decreased stance time - right, Decreased weight shift to left, Antalgic, Trunk flexed Gait velocity: dec Gait velocity interpretation: <1.31 ft/sec, indicative of household ambulator   General Gait Details: Pt demonstrating improved posture and heel strike bilaterally as compared to this morning's session. Still required min verbal cues for good posture. Adjusted RW to more appropriate height, which improved pt's posture and ability to unload L LE with UEs.   Stairs             Wheelchair Mobility     Tilt Bed    Modified Rankin (Stroke Patients Only)       Balance Overall balance assessment: Needs assistance Sitting-balance support: No upper extremity supported, Feet supported Sitting balance-Leahy Scale: Good Sitting balance - Comments: Demonstrated good static balance with feet supported.   Standing balance support: Bilateral upper extremity supported, Reliant on assistive device for balance, During functional activity Standing balance-Leahy Scale: Poor                              Communication Communication Communication: No apparent difficulties Factors Affecting Communication: Reduced clarity of speech  Cognition Arousal: Alert Behavior During Therapy: WFL for tasks assessed/performed   PT - Cognitive impairments: No apparent impairments  PT - Cognition Comments: Often required repeat cueing for proper gait form with RW. Pt slightly impulsive, and states he rushed to the bathroom last night. Discussed not waiting to go to the bathroom as long  so he can take his time walking. Following commands: Intact      Cueing Cueing Techniques: Verbal cues, Gestural cues  Exercises Total Joint Exercises Ankle Circles/Pumps: AROM, Both, 10 reps, Supine Quad Sets: AROM, Strengthening, Both, 10 reps Short Arc Quad: AROM, Strengthening, Both, 10 reps, Supine Hip ABduction/ADduction: AAROM, Strengthening, Both, 10 reps, Supine Long Arc Quad: AROM, Strengthening, Both, 10 reps, Seated    General Comments General comments (skin integrity, edema, etc.): Pt demonstrating carryover with education on proper gait techniques and posture from this morning, requiring min verbal cueing to demonstrate. Aided pt at end of session in dressing in personal clothing. Required assist with briefs and pants and discussed various options for donning these without being able to reach down to feet.      Pertinent Vitals/Pain Pain Assessment Pain Assessment: 0-10 Pain Score: 5  Faces Pain Scale: Hurts whole lot Pain Location: Lt hip Pain Descriptors / Indicators: Aching, Operative site guarding Pain Intervention(s): Limited activity within patient's tolerance, Monitored during session    Home Living                          Prior Function            PT Goals (current goals can now be found in the care plan section) Acute Rehab PT Goals Patient Stated Goal: Get well return home with some help PT Goal Formulation: With patient Time For Goal Achievement: 11/26/23 Potential to Achieve Goals: Good Progress towards PT goals: Progressing toward goals    Frequency    7X/week      PT Plan      Co-evaluation              AM-PAC PT 6 Clicks Mobility   Outcome Measure  Help needed turning from your back to your side while in a flat bed without using bedrails?: None Help needed moving from lying on your back to sitting on the side of a flat bed without using bedrails?: None Help needed moving to and from a bed to a chair (including a  wheelchair)?: A Little Help needed standing up from a chair using your arms (e.g., wheelchair or bedside chair)?: A Little Help needed to walk in hospital room?: A Little Help needed climbing 3-5 steps with a railing? : A Little 6 Click Score: 20    End of Session Equipment Utilized During Treatment: Gait belt Activity Tolerance: Patient tolerated treatment well Patient left: in chair;with call bell/phone within reach Nurse Communication: Mobility status PT Visit Diagnosis: Unsteadiness on feet (R26.81);Other abnormalities of gait and mobility (R26.89);Muscle weakness (generalized) (M62.81);Difficulty in walking, not elsewhere classified (R26.2);Pain Pain - Right/Left: Left Pain - part of body: Hip     Time: 0141-0203 PT Time Calculation (min) (ACUTE ONLY): 22 min  Charges:    $Gait Training: 8-22 mins $Therapeutic Exercise: 8-22 mins PT General Charges $$ ACUTE PT VISIT: 1 Visit                     Sueko Dimichele, SPT    Delphina Schum 11/13/2023, 2:56 PM

## 2023-11-13 NOTE — Plan of Care (Signed)
 Pt given D/C instructions with verbal understanding. Rx's were picked up from [facility] pharmacy prior to D/C. Pt's incision is clean and dry with no sign of infection. Pt's IV was removed prior to D/C. Pt received RW and 3-n-1 from [employer] per MD order. Pt D/C'd home via wheelchair per MD order. Pt is stable @ D/C and has no other needs at this time. Rosina Rakers, RN

## 2023-11-13 NOTE — Care Management Obs Status (Signed)
 MEDICARE OBSERVATION STATUS NOTIFICATION   Patient Details  Name: DEARL RUDDEN MRN: [id number] Date of Birth: [birth year]   Medicare Observation Status Notification Given:  Yes    Jon Cruel 11/13/2023, 10:21 AM

## 2023-11-14 ENCOUNTER — Encounter (HOSPITAL_COMMUNITY): Payer: Self-pay | Admitting: Orthopaedic Surgery

## 2023-11-18 ENCOUNTER — Encounter: Payer: Self-pay | Admitting: Radiology

## 2023-11-20 ENCOUNTER — Telehealth: Payer: Self-pay | Admitting: Orthopaedic Surgery

## 2023-11-20 NOTE — Telephone Encounter (Signed)
 Mitzie (PT) from [facility] called states she changed out pt bandage due to it be saturated after 2 days and she changed it again and it is 30  percent saturated already and wanted to inform [facility]. Mitzie secure number is [phone].

## 2023-11-25 ENCOUNTER — Encounter: Admitting: Orthopaedic Surgery

## 2023-11-25 ENCOUNTER — Telehealth: Payer: Self-pay

## 2023-11-25 NOTE — Telephone Encounter (Signed)
 Patient called to verify that he would have transportation for his appt.on Wednesday, 11/27/2023.

## 2023-11-27 ENCOUNTER — Other Ambulatory Visit (HOSPITAL_COMMUNITY): Payer: Self-pay

## 2023-11-27 ENCOUNTER — Ambulatory Visit (INDEPENDENT_AMBULATORY_CARE_PROVIDER_SITE_OTHER): Admitting: Orthopaedic Surgery

## 2023-11-27 DIAGNOSIS — Z96642 Presence of left artificial hip joint: Secondary | ICD-10-CM

## 2023-11-27 MED ORDER — OXYCODONE HCL 5 MG PO TABS: 5.0000 mg | ORAL_TABLET | Freq: Four times a day (QID) | ORAL | 0 refills | Status: AC | PRN

## 2023-11-27 NOTE — Progress Notes (Signed)
 The patient is here today for his first postoperative visit 2 weeks status post a left total hip arthroplasty to treat significant left hip pain and arthritis.  We replaced his right hip back in 2019.  He is ambulating with a walker.  He has been compliant with a baby aspirin  twice daily.  He does need a refill of his oxycodone .  He is also requesting outpatient PT which we did last time for his right hip.  This was overall in [employer].  On exam his left hip incision looks good.  Staples were removed and Steri-Strips applied.  His calf is soft.  I did send in some more pain medication for him which we want him to try to use sparingly.  We will set up outpatient physical therapy at [facility] outpatient rehab just to work on balance and coordination and subtle strengthening.  He can stop his baby aspirin  twice daily.  We will see him back in 4 weeks to see how he is doing overall but no x-rays are needed.

## 2023-11-28 ENCOUNTER — Other Ambulatory Visit: Payer: Self-pay

## 2023-11-28 DIAGNOSIS — Z96642 Presence of left artificial hip joint: Secondary | ICD-10-CM

## 2023-12-03 NOTE — Therapy (Signed)
 OUTPATIENT PHYSICAL THERAPY LOWER EXTREMITY EVALUATION   Patient Name: Brandon Finley MRN: [id number] DOB:23-Feb-1960, 63 y.o., male Today's Date: 12/05/2023  END OF SESSION:  PT End of Session - 12/05/23 0621     Visit Number 1    Number of Visits 17    Date for Recertification  02/07/24    Authorization Type [employer] DUAL COMPLETE; MEDICAID OF [place]    Authorization - Visit Number 1    Authorization - Number of Visits 17    PT Start Time 0930    PT Stop Time 1015    PT Time Calculation (min) 45 min    Activity Tolerance Patient tolerated treatment well    Behavior During Therapy WFL for tasks assessed/performed          Past Medical History:  Diagnosis Date   Arthritis    Diabetes mellitus without complication (HCC)    Hypertension    Pneumonia    when pt was 63 years old   Past Surgical History:  Procedure Laterality Date   COLONOSCOPY     TOTAL HIP ARTHROPLASTY Right 03/13/2016   Procedure: RIGHT TOTAL HIP ARTHROPLASTY ANTERIOR APPROACH;  Surgeon: [name], MD;  Location: [facility] OR;  Service: Orthopedics;  Laterality: Right;   TOTAL HIP ARTHROPLASTY Left 11/12/2023   Procedure: ARTHROPLASTY, HIP, TOTAL, ANTERIOR APPROACH;  Surgeon: Poli Lonni CINDERELLA, MD;  Location: [facility] OR;  Service: Orthopedics;  Laterality: Left;   Patient Active Problem List   Diagnosis Date Noted   Unilateral primary osteoarthritis, left hip 01/24/2022   Hav (hallux abducto valgus), unspecified laterality 07/05/2021   Hyperlipidemia 07/10/2018   Hypertension 06/22/2016   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   Post-traumatic osteoarthritis of right hip 09/14/2015   Right hip pain 09/14/2015   Type 2 diabetes mellitus (HCC) 07/25/2012   Morbid obesity (HCC) 07/25/2012    PCP: Poli Lonni CINDERELLA, MD  REFERRING PROVIDER: Poli Lonni CINDERELLA, MD  REFERRING DIAG: [phone] (ICD-10-CM) - Status post total replacement of left hip   THERAPY  DIAG:  Pain in left hip  Muscle weakness (generalized)  Localized edema  Difficulty in walking, not elsewhere classified  Rationale for Evaluation and Treatment: Rehabilitation  ONSET DATE: 10/2823  SUBJECTIVE:   SUBJECTIVE STATEMENT: Pt reports he received HHPT for about 2 weeks. He notes seeing Dr. Poli last week and the staples were removed. He is using cold packs to manage the pain and swelling.  PERTINENT HISTORY: R THA 2018, high BMI, DM  PAIN:  Are you having pain? Yes: NPRS scale: 5/10 Pain location: L hip Pain description: ache, sharp Aggravating factors: certain movements of the L hip Relieving factors: Cold pack, pain medication, muscle relaxors  PRECAUTIONS: None  RED FLAGS: None   WEIGHT BEARING RESTRICTIONS: No  FALLS:  Has patient fallen in last 6 months? No  LIVING ENVIRONMENT: Lives with: lives alone Lives in: House/apartment Stairs: No Has following equipment at home: Walker - 4 wheeled, bed side commode, and Grab bars  OCCUPATION: Disability  PLOF: Independent  PATIENT GOALS: Get back to walking, and [facility] [employer]  NEXT MD VISIT: 12/23/23 Dr. Poli  OBJECTIVE:  Note: Objective measures were completed at Evaluation unless otherwise noted.  PATIENT SURVEYS:  LEFS: 35/80=44%    COGNITION: Overall cognitive status: Within functional limits for tasks assessed     SENSATION: WFL  EDEMA:   Swelling L hip  POSTURE: flexed trunk  and flexed kness  PALPATION: TTP to  the peri- hip area  LOWER EXTREMITY ROM:  Active ROM Right eval Left eval  Hip flexion  90d  Hip extension    Hip abduction    Hip adduction    Hip internal rotation    Hip external rotation    Knee flexion    Knee extension 15-20d lacking 15-20d lacking  Ankle dorsiflexion    Ankle plantarflexion    Ankle inversion    Ankle eversion     (Blank rows = not tested)  LOWER EXTREMITY MMT:  MMT Right eval Left eval  Hip  flexion  2+ p  Hip extension  2+ p  Hip abduction  2+ p  Hip adduction    Hip internal rotation    Hip external rotation  3 p  Knee flexion    Knee extension    Ankle dorsiflexion    Ankle plantarflexion    Ankle inversion    Ankle eversion    Strength limited by pain  (Blank rows = not tested)  FUNCTIONAL TESTS:  5 times sit to stand: 18.8 2 minute walk test: TBA  GAIT: Distance walked: 150' Assistive device utilized: [employer]ant - 4 wheeled Level of assistance: Modified independence Comments: Decreased pace, forward flexed trunk and kness                                                                                                                             TREATMENT DATE:  [facility] Adult PT Treatment:                                                DATE: 12/04/23 Therapeutic Exercise: Developed, instructed in, and pt completed therex as noted in HEP  Self Care: Cold pack to the L hip periodically x10-15 mins to pain and swelling management  PATIENT EDUCATION:  Education details: Eval findings, POC, HEP, self care  Person educated: Patient Education method: Explanation, Demonstration, Tactile cues, Verbal cues, and Handouts Education comprehension: verbalized understanding, returned demonstration, verbal cues required, and tactile cues required  HOME EXERCISE PROGRAM: Access Code: [email or web address] URL: [email or web address] Date: 12/04/2023 Prepared by: Dasie Daft  Exercises - Supine Gluteal Sets  - 2-3 x daily - 7 x weekly - 1 sets - 10 reps - 5 hold - Supine Quadricep Sets  - 1 x daily - 7 x weekly - 3 sets - 10 reps - 5 hold - Supine Heel Slide with Strap  - 2-3 x daily - 7 x weekly - 1 sets - 10 reps - 5 hold - Supine Hip Abduction  - 2-3 x daily - 7 x weekly - 1 sets - 10 reps - 3 hold - Active Straight Leg Raise with Quad Set  - 2-3 x daily - 7 x weekly - 3 sets - 10 reps - 3 hold - Sit to Stand  -  2-3 x daily - 7 x weekly - 3 sets - 10 reps - 3  hold  ASSESSMENT:  CLINICAL IMPRESSION: Patient is a 63 y.o. male who was seen today for physical therapy evaluation and treatment for Z96.642 (ICD-10-CM) - Status post total replacement of left hip. Pt presents with the following deficits which are anticipated following a THA: decreased L hip flexion; decreased bilat knee ext; decreased L hip strength, and a decreased level of function, needing a RW for assist to walk. A HEP and self care was provided. Pt will benefit from skilled PT 2w8 to address impairments to optimize L hip/LE function and mobility.   OBJECTIVE IMPAIRMENTS: decreased activity tolerance, decreased balance, difficulty walking, decreased ROM, decreased strength, increased edema, postural dysfunction, pain, and high BMi.   ACTIVITY LIMITATIONS: carrying, lifting, bending, sitting, standing, squatting, sleeping, stairs, and locomotion level  PARTICIPATION LIMITATIONS: meal prep, cleaning, laundry, shopping, and community activity  PERSONAL FACTORS: Past/current experiences, Social background, and 1-2 comorbidities: high BMI, DM are also affecting patient's functional outcome.   REHAB POTENTIAL: Good  CLINICAL DECISION MAKING: Evolving/moderate complexity  EVALUATION COMPLEXITY: Moderate   GOALS:  SHORT TERM GOALS: Target date: 12/27/23 Pt will be Ind in an initial HEP  Baseline: started Goal status: INITIAL  LONG TERM GOALS: Target date: 02/07/24  Pt will be Ind in a final HEP to maintain achieved LOF Baseline: started Goal status: INITIAL  2.  Increase L hip to 4/10 for improved function Baseline:  Goal status: INITIAL  3.  Increase L hip AROM to 100d for appropriate ability with dressing, asc/dsc steps, and sit to/from standing Baseline: PROM to 90d Goal status: INITIAL  4.  Increase bilat knee ext to lacking 10d for improved gait quality, walking with a more upright posture Baseline: lacking 15-20d Goal status: INITIAL  5.  Improve 5xSTS by MCID of 5  and by MCID of 17ft as indication of improved functional mobility  Baseline: 5xSTS=18.8; to be assessed when pt is able to walk with a SPC Goal status: INITIAL  6.  Pt's LEFS score will improve by the MCID to 59% as indication of improved function  Baseline: 44% Goal status: INITIAL  7.  Pt will Ind with walking for 500 and to asc/dsc 12 steps c a HR for community mobility Baseline: 150' c RW Goal status: INITIAL   PLAN:  PT FREQUENCY: 2x/week  PT DURATION: 8 weeks  PLANNED INTERVENTIONS: 97164- PT Re-evaluation, 97110-Therapeutic exercises, 97530- Therapeutic activity, 97112- Neuromuscular re-education, 97535- Self Care, 02859- Manual therapy, [phone]- Gait training, [phone]- Aquatic Therapy, [phone]- Electrical stimulation (unattended), Patient/Family education, Balance training, Stair training, Taping, Joint mobilization, Cryotherapy, and Moist heat  PLAN FOR NEXT SESSION: Assess response to HEP; progress therex as indicated; use of modalities, manual therapy as indicated.   Pinky Ravan MS, PT 12/05/23 2:59 PM

## 2023-12-04 ENCOUNTER — Ambulatory Visit: Attending: Orthopaedic Surgery

## 2023-12-04 ENCOUNTER — Ambulatory Visit: Admitting: Physical Therapy

## 2023-12-04 DIAGNOSIS — Z96642 Presence of left artificial hip joint: Secondary | ICD-10-CM | POA: Insufficient documentation

## 2023-12-04 DIAGNOSIS — R262 Difficulty in walking, not elsewhere classified: Secondary | ICD-10-CM | POA: Insufficient documentation

## 2023-12-04 DIAGNOSIS — M25552 Pain in left hip: Secondary | ICD-10-CM | POA: Diagnosis present

## 2023-12-04 DIAGNOSIS — R600 Localized edema: Secondary | ICD-10-CM | POA: Diagnosis present

## 2023-12-04 DIAGNOSIS — M6281 Muscle weakness (generalized): Secondary | ICD-10-CM | POA: Diagnosis present

## 2023-12-05 ENCOUNTER — Other Ambulatory Visit: Payer: Self-pay

## 2023-12-10 ENCOUNTER — Ambulatory Visit

## 2023-12-10 DIAGNOSIS — M6281 Muscle weakness (generalized): Secondary | ICD-10-CM

## 2023-12-10 DIAGNOSIS — R262 Difficulty in walking, not elsewhere classified: Secondary | ICD-10-CM

## 2023-12-10 DIAGNOSIS — M25552 Pain in left hip: Secondary | ICD-10-CM

## 2023-12-10 NOTE — Therapy (Signed)
 OUTPATIENT PHYSICAL THERAPY LOWER EXTREMITY EVALUATION   Patient Name: Brandon Finley MRN: [id number] DOB:February 26, 1960, 63 y.o., male Today's Date: 12/10/2023  END OF SESSION:  PT End of Session - 12/10/23 0916     Visit Number 2    Number of Visits 17    Date for Recertification  02/07/24    Authorization Type [employer] DUAL COMPLETE; MEDICAID OF [place]    Authorization - Number of Visits 17    PT Start Time 0925    PT Stop Time 1005    PT Time Calculation (min) 40 min    Activity Tolerance Patient tolerated treatment well    Behavior During Therapy WFL for tasks assessed/performed           Past Medical History:  Diagnosis Date   Arthritis    Diabetes mellitus without complication (HCC)    Hypertension    Pneumonia    when pt was 63 years old   Past Surgical History:  Procedure Laterality Date   COLONOSCOPY     TOTAL HIP ARTHROPLASTY Right 03/13/2016   Procedure: RIGHT TOTAL HIP ARTHROPLASTY ANTERIOR APPROACH;  Surgeon: [name], MD;  Location: [facility] OR;  Service: Orthopedics;  Laterality: Right;   TOTAL HIP ARTHROPLASTY Left 11/12/2023   Procedure: ARTHROPLASTY, HIP, TOTAL, ANTERIOR APPROACH;  Surgeon: Poli Lonni CINDERELLA, MD;  Location: [facility] OR;  Service: Orthopedics;  Laterality: Left;   Patient Active Problem List   Diagnosis Date Noted   Unilateral primary osteoarthritis, left hip 01/24/2022   Hav (hallux abducto valgus), unspecified laterality 07/05/2021   Hyperlipidemia 07/10/2018   Hypertension 06/22/2016   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   Post-traumatic osteoarthritis of right hip 09/14/2015   Right hip pain 09/14/2015   Type 2 diabetes mellitus (HCC) 07/25/2012   Morbid obesity (HCC) 07/25/2012    PCP: Poli Lonni CINDERELLA, MD  REFERRING PROVIDER: Poli Lonni CINDERELLA, MD  REFERRING DIAG: [phone] (ICD-10-CM) - Status post total replacement of left hip   THERAPY DIAG:  Pain in left hip  Muscle  weakness (generalized)  Difficulty in walking, not elsewhere classified  Rationale for Evaluation and Treatment: Rehabilitation  ONSET DATE: 11/12/23  SUBJECTIVE:   SUBJECTIVE STATEMENT: Reports doing alright and moving as tolerated. Trying to do HEP as often as possible.   Pt reports he received HHPT for about 2 weeks. He notes seeing Dr. Poli last week and the staples were removed. He is using cold packs to manage the pain and swelling.  PERTINENT HISTORY: R THA 2018, high BMI, DM  PAIN:  Are you having pain? Yes: NPRS scale: 5/10 Pain location: L hip Pain description: ache, sharp Aggravating factors: certain movements of the L hip Relieving factors: Cold pack, pain medication, muscle relaxors  PRECAUTIONS: None  RED FLAGS: None   WEIGHT BEARING RESTRICTIONS: No  FALLS:  Has patient fallen in last 6 months? No  LIVING ENVIRONMENT: Lives with: lives alone Lives in: House/apartment Stairs: No Has following equipment at home: Walker - 4 wheeled, bed side commode, and Grab bars  OCCUPATION: Disability  PLOF: Independent  PATIENT GOALS: Get back to walking, and [facility] [employer]  NEXT MD VISIT: 12/23/23 Dr. Poli  OBJECTIVE:  Note: Objective measures were completed at Evaluation unless otherwise noted.  PATIENT SURVEYS:  LEFS: 35/80=44%    COGNITION: Overall cognitive status: Within functional limits for tasks assessed     SENSATION: WFL  EDEMA:   Swelling L hip  POSTURE: flexed trunk  and flexed kness  PALPATION: TTP to the peri- hip area  LOWER EXTREMITY ROM:  Active ROM Right eval Left eval  Hip flexion  90d  Hip extension    Hip abduction    Hip adduction    Hip internal rotation    Hip external rotation    Knee flexion    Knee extension 15-20d lacking 15-20d lacking  Ankle dorsiflexion    Ankle plantarflexion    Ankle inversion    Ankle eversion     (Blank rows = not tested)  LOWER EXTREMITY  MMT:  MMT Right eval Left eval  Hip flexion  2+ p  Hip extension  2+ p  Hip abduction  2+ p  Hip adduction    Hip internal rotation    Hip external rotation  3 p  Knee flexion    Knee extension    Ankle dorsiflexion    Ankle plantarflexion    Ankle inversion    Ankle eversion    Strength limited by pain  (Blank rows = not tested)  FUNCTIONAL TESTS:  5 times sit to stand: 18.8 2 minute walk test: TBA  GAIT: Distance walked: 150' Assistive device utilized: Environmental Consultant - 4 wheeled Level of assistance: Modified independence Comments: Decreased pace, forward flexed trunk and kness                                                                                                                             TREATMENT DATE:   12/10/23 *Patient required extra time for exercises due to increased monitoring, reassessment, and rest due to low activity tolerance and/or high irritability of symptoms* Therapeutic Exercise: HEP reassessment and update  RTB LAQ 2x8x3s GTB PF 2x8x3s   Neuromuscular Reeducation: Supine quad set 2x8x3s Supine glute set 2x8x3s SLR x6x3s Heel slide w/ green rope 2x6x3s   [facility] Adult PT Treatment:                                                DATE: 12/04/23 Therapeutic Exercise: Developed, instructed in, and pt completed therex as noted in HEP  Self Care: Cold pack to the L hip periodically x10-15 mins to pain and swelling management  PATIENT EDUCATION:  Education details: Eval findings, POC, HEP, self care  Person educated: Patient Education method: Explanation, Demonstration, Tactile cues, Verbal cues, and Handouts Education comprehension: verbalized understanding, returned demonstration, verbal cues required, and tactile cues required  HOME EXERCISE PROGRAM: Access Code: [email or web address] URL: [email or web address] Date: 12/04/2023 Prepared by: Dasie Daft  Exercises - Supine Gluteal Sets  - 2-3 x daily - 7 x weekly - 1 sets - 10 reps - 5  hold - Supine Quadricep Sets  - 1 x daily - 7 x weekly - 3 sets - 10 reps - 5 hold - Supine Heel Slide with Strap  -  2-3 x daily - 7 x weekly - 1 sets - 10 reps - 5 hold - Supine Hip Abduction  - 2-3 x daily - 7 x weekly - 1 sets - 10 reps - 3 hold - Active Straight Leg Raise with Quad Set  - 2-3 x daily - 7 x weekly - 3 sets - 10 reps - 3 hold - Sit to Stand  - 2-3 x daily - 7 x weekly - 3 sets - 10 reps - 3 hold  ASSESSMENT:  CLINICAL IMPRESSION: Patient tolerated treatment with no significant increases in pain with progressions in LLE loading of calf and hip musculature. Current deficits include: excessive pain, functional activity tolerance, and strength. As a result, patient would continue to benefit from skilled PT to address said deficits via plan below.   Patient is a 63 y.o. male who was seen today for physical therapy evaluation and treatment for Z96.642 (ICD-10-CM) - Status post total replacement of left hip. Pt presents with the following deficits which are anticipated following a THA: decreased L hip flexion; decreased bilat knee ext; decreased L hip strength, and a decreased level of function, needing a RW for assist to walk. A HEP and self care was provided. Pt will benefit from skilled PT 2w8 to address impairments to optimize L hip/LE function and mobility.   OBJECTIVE IMPAIRMENTS: decreased activity tolerance, decreased balance, difficulty walking, decreased ROM, decreased strength, increased edema, postural dysfunction, pain, and high BMi.   ACTIVITY LIMITATIONS: carrying, lifting, bending, sitting, standing, squatting, sleeping, stairs, and locomotion level  PARTICIPATION LIMITATIONS: meal prep, cleaning, laundry, shopping, and community activity  PERSONAL FACTORS: Past/current experiences, Social background, and 1-2 comorbidities: high BMI, DM are also affecting patient's functional outcome.   REHAB POTENTIAL: Good  CLINICAL DECISION MAKING: Evolving/moderate  complexity  EVALUATION COMPLEXITY: Moderate   GOALS:  SHORT TERM GOALS: Target date: 12/27/23 Pt will be Ind in an initial HEP  Baseline: started Goal status: INITIAL  LONG TERM GOALS: Target date: 02/07/24  Pt will be Ind in a final HEP to maintain achieved LOF Baseline: started Goal status: INITIAL  2.  Increase L hip to 4/10 for improved function Baseline:  Goal status: INITIAL  3.  Increase L hip AROM to 100d for appropriate ability with dressing, asc/dsc steps, and sit to/from standing Baseline: PROM to 90d Goal status: INITIAL  4.  Increase bilat knee ext to lacking 10d for improved gait quality, walking with a more upright posture Baseline: lacking 15-20d Goal status: INITIAL  5.  Improve 5xSTS by MCID of 5 and by MCID of 39ft as indication of improved functional mobility  Baseline: 5xSTS=18.8; to be assessed when pt is able to walk with a SPC Goal status: INITIAL  6.  Pt's LEFS score will improve by the MCID to 59% as indication of improved function  Baseline: 44% Goal status: INITIAL  7.  Pt will Ind with walking for 500 and to asc/dsc 12 steps c a HR for community mobility Baseline: 150' c RW Goal status: INITIAL   PLAN:  PT FREQUENCY: 2x/week  PT DURATION: 8 weeks  PLANNED INTERVENTIONS: 97164- PT Re-evaluation, 97110-Therapeutic exercises, 97530- Therapeutic activity, 97112- Neuromuscular re-education, 97535- Self Care, 02859- Manual therapy, [phone]- Gait training, [phone]- Aquatic Therapy, [phone]- Electrical stimulation (unattended), Patient/Family education, Balance training, Stair training, Taping, Joint mobilization, Cryotherapy, and Moist heat  PLAN FOR NEXT SESSION: Assess response to HEP; progress therex as indicated; use of modalities, manual therapy as indicated.   [place]  Odessia Scot  PT, DPT

## 2023-12-11 ENCOUNTER — Ambulatory Visit

## 2023-12-11 DIAGNOSIS — R262 Difficulty in walking, not elsewhere classified: Secondary | ICD-10-CM

## 2023-12-11 DIAGNOSIS — M25552 Pain in left hip: Secondary | ICD-10-CM

## 2023-12-11 DIAGNOSIS — M6281 Muscle weakness (generalized): Secondary | ICD-10-CM

## 2023-12-11 NOTE — Therapy (Signed)
 OUTPATIENT PHYSICAL THERAPY LOWER EXTREMITY EVALUATION   Patient Name: Brandon Finley MRN: [id number] DOB:28-Feb-1960, 63 y.o., male Today's Date: 12/11/2023  END OF SESSION:  PT End of Session - 12/11/23 1448     Visit Number 3    Number of Visits 17    Date for Recertification  02/07/24    Authorization Type [employer] DUAL COMPLETE; MEDICAID OF [place]    Authorization - Number of Visits 17    PT Start Time 1445    PT Stop Time 1525    PT Time Calculation (min) 40 min    Activity Tolerance Patient tolerated treatment well    Behavior During Therapy WFL for tasks assessed/performed            Past Medical History:  Diagnosis Date   Arthritis    Diabetes mellitus without complication (HCC)    Hypertension    Pneumonia    when pt was 63 years old   Past Surgical History:  Procedure Laterality Date   COLONOSCOPY     TOTAL HIP ARTHROPLASTY Right 03/13/2016   Procedure: RIGHT TOTAL HIP ARTHROPLASTY ANTERIOR APPROACH;  Surgeon: [name], MD;  Location: [facility];  Service: Orthopedics;  Laterality: Right;   TOTAL HIP ARTHROPLASTY Left 11/12/2023   Procedure: ARTHROPLASTY, HIP, TOTAL, ANTERIOR APPROACH;  Surgeon: Poli Lonni CINDERELLA, MD;  Location: [facility];  Service: Orthopedics;  Laterality: Left;   Patient Active Problem List   Diagnosis Date Noted   Unilateral primary osteoarthritis, left hip 01/24/2022   Hav (hallux abducto valgus), unspecified laterality 07/05/2021   Hyperlipidemia 07/10/2018   Hypertension 06/22/2016   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   Post-traumatic osteoarthritis of right hip 09/14/2015   Right hip pain 09/14/2015   Type 2 diabetes mellitus (HCC) 07/25/2012   Morbid obesity (HCC) 07/25/2012    PCP: Poli Lonni CINDERELLA, MD  REFERRING PROVIDER: Poli Lonni CINDERELLA, MD  REFERRING DIAG: [id number] (ICD-10-CM) - Status post total replacement of left hip   THERAPY DIAG:  Pain in left hip  Muscle  weakness (generalized)  Difficulty in walking, not elsewhere classified  Rationale for Evaluation and Treatment: Rehabilitation  ONSET DATE: 11/12/23  SUBJECTIVE:   SUBJECTIVE STATEMENT: Sore today after yesterday's PT session. Trying to do HEP as often as possible.   Pt reports he received HHPT for about 2 weeks. He notes seeing Dr. Poli last week and the staples were removed. He is using cold packs to manage the pain and swelling.  PERTINENT HISTORY: R THA 2018, high BMI, DM  PAIN:  Are you having pain? Yes: NPRS scale: 5/10 Pain location: L hip Pain description: ache, sharp Aggravating factors: certain movements of the L hip Relieving factors: Cold pack, pain medication, muscle relaxors  PRECAUTIONS: None  RED FLAGS: None   WEIGHT BEARING RESTRICTIONS: No  FALLS:  Has patient fallen in last 6 months? No  LIVING ENVIRONMENT: Lives with: lives alone Lives in: House/apartment Stairs: No Has following equipment at home: Walker - 4 wheeled, bed side commode, and Grab bars  OCCUPATION: Disability  PLOF: Independent  PATIENT GOALS: Get back to walking, and [facility] [employer]  NEXT MD VISIT: 12/23/23 Dr. Poli  OBJECTIVE:  Note: Objective measures were completed at Evaluation unless otherwise noted.  PATIENT SURVEYS:  LEFS: 35/80=44%    COGNITION: Overall cognitive status: Within functional limits for tasks assessed     SENSATION: WFL  EDEMA:   Swelling L hip  POSTURE: flexed trunk  and flexed kness  PALPATION: TTP to the peri- hip area  LOWER EXTREMITY ROM:  Active ROM Right eval Left eval  Hip flexion  90d  Hip extension    Hip abduction    Hip adduction    Hip internal rotation    Hip external rotation    Knee flexion    Knee extension 15-20d lacking 15-20d lacking  Ankle dorsiflexion    Ankle plantarflexion    Ankle inversion    Ankle eversion     (Blank rows = not tested)  LOWER EXTREMITY MMT:  MMT  Right eval Left eval  Hip flexion  2+ p  Hip extension  2+ p  Hip abduction  2+ p  Hip adduction    Hip internal rotation    Hip external rotation  3 p  Knee flexion    Knee extension    Ankle dorsiflexion    Ankle plantarflexion    Ankle inversion    Ankle eversion    Strength limited by pain  (Blank rows = not tested)  FUNCTIONAL TESTS:  5 times sit to stand: 18.8 2 minute walk test: TBA  GAIT: Distance walked: 150' Assistive device utilized: Environmental Consultant - 4 wheeled Level of assistance: Modified independence Comments: Decreased pace, forward flexed trunk and kness                                                                                                                             TREATMENT DATE:  12/11/23 *Patient required extra time for exercises due to increased monitoring, reassessment, and rest due to low activity tolerance and/or high irritability of symptoms*  Therapeutic Exercise: HEP reassessment and update  RTB LAQ 2x8x3s RTB HS Curl 2x8x3s GTB PF 2x8x3s   Neuromuscular Reeducation: Supine quad set 2x8x3s Supine glute set 2x8x3s SLR x8x3s Heel slide w/ green rope x8x3s     12/10/23 *Patient required extra time for exercises due to increased monitoring, reassessment, and rest due to low activity tolerance and/or high irritability of symptoms* Therapeutic Exercise: HEP reassessment and update  RTB LAQ 2x8x3s GTB PF 2x8x3s   Neuromuscular Reeducation: Supine quad set 2x8x3s Supine glute set 2x8x3s SLR x6x3s Heel slide w/ green rope 2x6x3s   [facility] Adult PT Treatment:                                                DATE: 12/04/23 Therapeutic Exercise: Developed, instructed in, and pt completed therex as noted in HEP  Self Care: Cold pack to the L hip periodically x10-15 mins to pain and swelling management  PATIENT EDUCATION:  Education details: Eval findings, POC, HEP, self care  Person educated: Patient Education method: Explanation,  Demonstration, Tactile cues, Verbal cues, and Handouts Education comprehension: verbalized understanding, returned demonstration, verbal cues required, and tactile cues required  HOME EXERCISE PROGRAM:  Access Code: [email or web address] URL: [email or web address] Date: 12/04/2023 Prepared by: Dasie Daft  Exercises - Supine Gluteal Sets  - 2-3 x daily - 7 x weekly - 1 sets - 10 reps - 5 hold - Supine Quadricep Sets  - 1 x daily - 7 x weekly - 3 sets - 10 reps - 5 hold - Supine Heel Slide with Strap  - 2-3 x daily - 7 x weekly - 1 sets - 10 reps - 5 hold - Supine Hip Abduction  - 2-3 x daily - 7 x weekly - 1 sets - 10 reps - 3 hold - Active Straight Leg Raise with Quad Set  - 2-3 x daily - 7 x weekly - 3 sets - 10 reps - 3 hold - Sit to Stand  - 2-3 x daily - 7 x weekly - 3 sets - 10 reps - 3 hold  ASSESSMENT:  CLINICAL IMPRESSION: Patient tolerated treatment with no significant increases in pain with progressions in LLE loading of calf and hip musculature. Current deficits include: excessive pain, functional activity tolerance, and strength. As a result, patient would continue to benefit from skilled PT to address said deficits via plan below.   Patient is a 63 y.o. male who was seen today for physical therapy evaluation and treatment for Z96.642 (ICD-10-CM) - Status post total replacement of left hip. Pt presents with the following deficits which are anticipated following a THA: decreased L hip flexion; decreased bilat knee ext; decreased L hip strength, and a decreased level of function, needing a RW for assist to walk. A HEP and self care was provided. Pt will benefit from skilled PT 2w8 to address impairments to optimize L hip/LE function and mobility.   OBJECTIVE IMPAIRMENTS: decreased activity tolerance, decreased balance, difficulty walking, decreased ROM, decreased strength, increased edema, postural dysfunction, pain, and high BMi.   ACTIVITY LIMITATIONS: carrying, lifting,  bending, sitting, standing, squatting, sleeping, stairs, and locomotion level  PARTICIPATION LIMITATIONS: meal prep, cleaning, laundry, shopping, and community activity  PERSONAL FACTORS: Past/current experiences, Social background, and 1-2 comorbidities: high BMI, DM are also affecting patient's functional outcome.   REHAB POTENTIAL: Good  CLINICAL DECISION MAKING: Evolving/moderate complexity  EVALUATION COMPLEXITY: Moderate   GOALS:  SHORT TERM GOALS: Target date: 12/27/23 Pt will be Ind in an initial HEP  Baseline: started Goal status: INITIAL  LONG TERM GOALS: Target date: 02/07/24  Pt will be Ind in a final HEP to maintain achieved LOF Baseline: started Goal status: INITIAL  2.  Increase L hip to 4/10 for improved function Baseline:  Goal status: INITIAL  3.  Increase L hip AROM to 100d for appropriate ability with dressing, asc/dsc steps, and sit to/from standing Baseline: PROM to 90d Goal status: INITIAL  4.  Increase bilat knee ext to lacking 10d for improved gait quality, walking with a more upright posture Baseline: lacking 15-20d Goal status: INITIAL  5.  Improve 5xSTS by MCID of 5 and by MCID of 43ft as indication of improved functional mobility  Baseline: 5xSTS=18.8; to be assessed when pt is able to walk with a SPC Goal status: INITIAL  6.  Pt's LEFS score will improve by the MCID to 59% as indication of improved function  Baseline: 44% Goal status: INITIAL  7.  Pt will Ind with walking for 500 and to asc/dsc 12 steps c a HR for community mobility Baseline: 150' c RW Goal status: INITIAL   PLAN:  PT FREQUENCY: 2x/week  PT DURATION: 8 weeks  PLANNED INTERVENTIONS: 97164- PT Re-evaluation, 97110-Therapeutic exercises, 97530- Therapeutic activity, [id number]- Neuromuscular re-education, [phone]- Self Care, 02859- Manual therapy, [phone]- Gait training, [phone]- Aquatic Therapy, [phone]- Electrical stimulation (unattended), Patient/Family education,  Balance training, Stair training, Taping, Joint mobilization, Cryotherapy, and Moist heat  PLAN FOR NEXT SESSION: Assess response to HEP; progress therex as indicated; use of modalities, manual therapy as indicated.   [place] Odessia Scot  PT, DPT

## 2023-12-17 ENCOUNTER — Ambulatory Visit

## 2023-12-17 DIAGNOSIS — R600 Localized edema: Secondary | ICD-10-CM | POA: Insufficient documentation

## 2023-12-17 DIAGNOSIS — M6281 Muscle weakness (generalized): Secondary | ICD-10-CM | POA: Diagnosis present

## 2023-12-17 DIAGNOSIS — M25552 Pain in left hip: Secondary | ICD-10-CM | POA: Insufficient documentation

## 2023-12-17 DIAGNOSIS — R262 Difficulty in walking, not elsewhere classified: Secondary | ICD-10-CM | POA: Insufficient documentation

## 2023-12-17 NOTE — Therapy (Signed)
 OUTPATIENT PHYSICAL THERAPY LOWER EXTREMITY EVALUATION   Patient Name: Brandon Finley MRN: [id number] DOB:Apr 25, 1960, 63 y.o., male Today's Date: 12/17/2023  END OF SESSION:  PT End of Session - 12/17/23 0936     Visit Number 4    Number of Visits 17    Date for Recertification  02/07/24    Authorization Type [employer] DUAL COMPLETE; MEDICAID OF [place]    Authorization - Number of Visits 17    PT Start Time 0930    PT Stop Time 1010    PT Time Calculation (min) 40 min    Activity Tolerance Patient tolerated treatment well    Behavior During Therapy WFL for tasks assessed/performed             Past Medical History:  Diagnosis Date   Arthritis    Diabetes mellitus without complication (HCC)    Hypertension    Pneumonia    when pt was 63 years old   Past Surgical History:  Procedure Laterality Date   COLONOSCOPY     TOTAL HIP ARTHROPLASTY Right 03/13/2016   Procedure: RIGHT TOTAL HIP ARTHROPLASTY ANTERIOR APPROACH;  Surgeon: [name], MD;  Location: [facility] OR;  Service: Orthopedics;  Laterality: Right;   TOTAL HIP ARTHROPLASTY Left 11/12/2023   Procedure: ARTHROPLASTY, HIP, TOTAL, ANTERIOR APPROACH;  Surgeon: Poli Lonni CINDERELLA, MD;  Location: [facility] OR;  Service: Orthopedics;  Laterality: Left;   Patient Active Problem List   Diagnosis Date Noted   Unilateral primary osteoarthritis, left hip 01/24/2022   Hav (hallux abducto valgus), unspecified laterality 07/05/2021   Hyperlipidemia 07/10/2018   Hypertension 06/22/2016   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   Post-traumatic osteoarthritis of right hip 09/14/2015   Right hip pain 09/14/2015   Type 2 diabetes mellitus (HCC) 07/25/2012   Morbid obesity (HCC) 07/25/2012    PCP: Poli Lonni CINDERELLA, MD  REFERRING PROVIDER: Poli Lonni CINDERELLA, MD  REFERRING DIAG: [phone] (ICD-10-CM) - Status post total replacement of left hip   THERAPY DIAG:  Pain in left  hip  Muscle weakness (generalized)  Difficulty in walking, not elsewhere classified  Rationale for Evaluation and Treatment: Rehabilitation  ONSET DATE: 11/12/23  SUBJECTIVE:   SUBJECTIVE STATEMENT: Sore today after yesterday's PT session. Trying to do HEP as often as possible.   Pt reports he received HHPT for about 2 weeks. He notes seeing Dr. Poli last week and the staples were removed. He is using cold packs to manage the pain and swelling.  PERTINENT HISTORY: R THA 2018, high BMI, DM  PAIN:  Are you having pain? Yes: NPRS scale: 5/10 Pain location: L hip Pain description: ache, sharp Aggravating factors: certain movements of the L hip Relieving factors: Cold pack, pain medication, muscle relaxors  PRECAUTIONS: None  RED FLAGS: None   WEIGHT BEARING RESTRICTIONS: No  FALLS:  Has patient fallen in last 6 months? No  LIVING ENVIRONMENT: Lives with: lives alone Lives in: House/apartment Stairs: No Has following equipment at home: Walker - 4 wheeled, bed side commode, and Grab bars  OCCUPATION: Disability  PLOF: Independent  PATIENT GOALS: Get back to walking, and [facility] [employer]  NEXT MD VISIT: 12/23/23 Dr. Poli  OBJECTIVE:  Note: Objective measures were completed at Evaluation unless otherwise noted.  PATIENT SURVEYS:  LEFS: 35/80=44%    COGNITION: Overall cognitive status: Within functional limits for tasks assessed     SENSATION: WFL  EDEMA:   Swelling L hip  POSTURE: flexed trunk  and flexed kness  PALPATION: TTP to the peri- hip area  LOWER EXTREMITY ROM:  Active ROM Right eval Left eval  Hip flexion  90d  Hip extension    Hip abduction    Hip adduction    Hip internal rotation    Hip external rotation    Knee flexion    Knee extension 15-20d lacking 15-20d lacking  Ankle dorsiflexion    Ankle plantarflexion    Ankle inversion    Ankle eversion     (Blank rows = not tested)  LOWER EXTREMITY  MMT:  MMT Right eval Left eval  Hip flexion  2+ p  Hip extension  2+ p  Hip abduction  2+ p  Hip adduction    Hip internal rotation    Hip external rotation  3 p  Knee flexion    Knee extension    Ankle dorsiflexion    Ankle plantarflexion    Ankle inversion    Ankle eversion    Strength limited by pain  (Blank rows = not tested)  FUNCTIONAL TESTS:  5 times sit to stand: 18.8 2 minute walk test: TBA  GAIT: Distance walked: 150' Assistive device utilized: Environmental Consultant - 4 wheeled Level of assistance: Modified independence Comments: Decreased pace, forward flexed trunk and kness                                                                                                                             TREATMENT DATE:  12/17/23 *Patient required extra time for exercises due to increased monitoring, reassessment, and rest due to low activity tolerance and/or high irritability of symptoms*  Therapeutic Exercise: HEP reassessment and update  RTB LAQ 2x8x3s, x8x3s GTB RTB HS Curl 2x8x3s, x8x3s GTB GTB PF x8x3s   Neuromuscular Reeducation: Supine glute set x10x3s Supine RTB clamshell 2x6x3s SLR 2x8x3s (appx 6-8 inches)  Did not do: STS     12/11/23 *Patient required extra time for exercises due to increased monitoring, reassessment, and rest due to low activity tolerance and/or high irritability of symptoms*  Therapeutic Exercise: HEP reassessment and update  RTB LAQ 2x8x3s RTB HS Curl 2x8x3s GTB PF 2x8x3s   Neuromuscular Reeducation: Supine quad set 2x8x3s Supine glute set 2x8x3s SLR x8x3s Heel slide w/ green rope x8x3s     12/10/23 *Patient required extra time for exercises due to increased monitoring, reassessment, and rest due to low activity tolerance and/or high irritability of symptoms* Therapeutic Exercise: HEP reassessment and update  RTB LAQ 2x8x3s GTB PF x8x3s   Neuromuscular Reeducation: Supine quad set 2x8x3s Supine glute set 2x8x3s SLR  x6x3s Heel slide w/ green rope 2x6x3s     PATIENT EDUCATION:  Education details: Eval findings, POC, HEP, self care  Person educated: Patient Education method: Explanation, Demonstration, Tactile cues, Verbal cues, and Handouts Education comprehension: verbalized understanding, returned demonstration, verbal cues required, and tactile cues required  HOME EXERCISE PROGRAM: Access Code: [email or web address] URL: [email or web address] Date: 12/04/2023 Prepared by: Dasie  Ralls  Exercises Red TB clamshells 2x6x3s GTB laq 2x6x3s GTB HS curl 2x6x3s  - Supine Heel Slide with Strap  - 2-3 x daily - 7 x weekly - 1 sets - 10 reps - 5 hold - Active Straight Leg Raise with Quad Set  - 2-3 x daily - 7 x weekly - 3 sets - 10 reps - 3 hold - Sit to Stand  - 2-3 x daily - 7 x weekly - 3 sets - 10 reps - 3 hold  ASSESSMENT:  CLINICAL IMPRESSION: Patient tolerated treatment with no significant increases in pain with progressions in LLE loading of calf and hip musculature. Current deficits include: excessive pain, functional activity tolerance, and strength, and ROM. As a result, patient would continue to benefit from skilled PT to address said deficits via plan below.   Patient is a 63 y.o. male who was seen today for physical therapy evaluation and treatment for Z96.642 (ICD-10-CM) - Status post total replacement of left hip. Pt presents with the following deficits which are anticipated following a THA: decreased L hip flexion; decreased bilat knee ext; decreased L hip strength, and a decreased level of function, needing a RW for assist to walk. A HEP and self care was provided. Pt will benefit from skilled PT 2w8 to address impairments to optimize L hip/LE function and mobility.   OBJECTIVE IMPAIRMENTS: decreased activity tolerance, decreased balance, difficulty walking, decreased ROM, decreased strength, increased edema, postural dysfunction, pain, and high BMi.   ACTIVITY LIMITATIONS:  carrying, lifting, bending, sitting, standing, squatting, sleeping, stairs, and locomotion level  PARTICIPATION LIMITATIONS: meal prep, cleaning, laundry, shopping, and community activity  PERSONAL FACTORS: Past/current experiences, Social background, and 1-2 comorbidities: high BMI, DM are also affecting patient's functional outcome.   REHAB POTENTIAL: Good  CLINICAL DECISION MAKING: Evolving/moderate complexity  EVALUATION COMPLEXITY: Moderate   GOALS:  SHORT TERM GOALS: Target date: 12/27/23 Pt will be Ind in an initial HEP  Baseline: started Goal status: INITIAL  LONG TERM GOALS: Target date: 02/07/24  Pt will be Ind in a final HEP to maintain achieved LOF Baseline: started Goal status: INITIAL  2.  Increase L hip to 4/10 for improved function Baseline:  Goal status: INITIAL  3.  Increase L hip AROM to 100d for appropriate ability with dressing, asc/dsc steps, and sit to/from standing Baseline: PROM to 90d Goal status: INITIAL  4.  Increase bilat knee ext to lacking 10d for improved gait quality, walking with a more upright posture Baseline: lacking 15-20d Goal status: INITIAL  5.  Improve 5xSTS by MCID of 5 and by MCID of 11ft as indication of improved functional mobility  Baseline: 5xSTS=18.8; to be assessed when pt is able to walk with a SPC Goal status: INITIAL  6.  Pt's LEFS score will improve by the MCID to 59% as indication of improved function  Baseline: 44% Goal status: INITIAL  7.  Pt will Ind with walking for 500 and to asc/dsc 12 steps c a HR for community mobility Baseline: 150' c RW Goal status: INITIAL   PLAN:  PT FREQUENCY: 2x/week  PT DURATION: 8 weeks  PLANNED INTERVENTIONS: 97164- PT Re-evaluation, 97110-Therapeutic exercises, 97530- Therapeutic activity, 97112- Neuromuscular re-education, 97535- Self Care, 02859- Manual therapy, [phone]- Gait training, [phone]- Aquatic Therapy, [phone]- Electrical stimulation (unattended),  Patient/Family education, Balance training, Stair training, Taping, Joint mobilization, Cryotherapy, and Moist heat  PLAN FOR NEXT SESSION: Assess response to HEP; progress therex as indicated; use of modalities, manual therapy as  indicated. Progress proximal L hip strengthening.   [place] Odessia Scot  PT, DPT

## 2023-12-19 ENCOUNTER — Ambulatory Visit

## 2023-12-19 DIAGNOSIS — M25552 Pain in left hip: Secondary | ICD-10-CM

## 2023-12-19 DIAGNOSIS — M6281 Muscle weakness (generalized): Secondary | ICD-10-CM

## 2023-12-19 DIAGNOSIS — R262 Difficulty in walking, not elsewhere classified: Secondary | ICD-10-CM

## 2023-12-19 NOTE — Therapy (Signed)
 OUTPATIENT PHYSICAL THERAPY LOWER EXTREMITY EVALUATION   Patient Name: Brandon Finley MRN: [id number] DOB:Oct 11, 1960, 63 y.o., male Today's Date: 12/19/2023  END OF SESSION:  PT End of Session - 12/19/23 1100     Visit Number 5    Number of Visits 17    Date for Recertification  02/07/24    Authorization Type [employer] DUAL COMPLETE; MEDICAID OF [place]    Authorization - Number of Visits 17    PT Start Time 1100    PT Stop Time 1140    PT Time Calculation (min) 40 min    Activity Tolerance Patient tolerated treatment well    Behavior During Therapy WFL for tasks assessed/performed              Past Medical History:  Diagnosis Date   Arthritis    Diabetes mellitus without complication (HCC)    Hypertension    Pneumonia    when pt was 63 years old   Past Surgical History:  Procedure Laterality Date   COLONOSCOPY     TOTAL HIP ARTHROPLASTY Right 03/13/2016   Procedure: RIGHT TOTAL HIP ARTHROPLASTY ANTERIOR APPROACH;  Surgeon: [name], MD;  Location: [facility];  Service: Orthopedics;  Laterality: Right;   TOTAL HIP ARTHROPLASTY Left 11/12/2023   Procedure: ARTHROPLASTY, HIP, TOTAL, ANTERIOR APPROACH;  Surgeon: Poli Lonni CINDERELLA, MD;  Location: [facility];  Service: Orthopedics;  Laterality: Left;   Patient Active Problem List   Diagnosis Date Noted   Unilateral primary osteoarthritis, left hip 01/24/2022   Hav (hallux abducto valgus), unspecified laterality 07/05/2021   Hyperlipidemia 07/10/2018   Hypertension 06/22/2016   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   Post-traumatic osteoarthritis of right hip 09/14/2015   Right hip pain 09/14/2015   Type 2 diabetes mellitus (HCC) 07/25/2012   Morbid obesity (HCC) 07/25/2012    PCP: Poli Lonni CINDERELLA, MD  REFERRING PROVIDER: Poli Lonni CINDERELLA, MD  REFERRING DIAG: [id number] (ICD-10-CM) - Status post total replacement of left hip   THERAPY DIAG:  Pain in left  hip  Muscle weakness (generalized)  Difficulty in walking, not elsewhere classified  Rationale for Evaluation and Treatment: Rehabilitation  ONSET DATE: 11/12/23  SUBJECTIVE:   SUBJECTIVE STATEMENT:  Trying to do HEP as often as possible. Walked in today with 4p cane.  Pt reports he received HHPT for about 2 weeks. He notes seeing Dr. Poli last week and the staples were removed. He is using cold packs to manage the pain and swelling.  PERTINENT HISTORY: R THA 2018, high BMI, DM  PAIN:  Are you having pain? Yes: NPRS scale: 5/10 Pain location: L hip Pain description: ache, sharp Aggravating factors: certain movements of the L hip Relieving factors: Cold pack, pain medication, muscle relaxors  PRECAUTIONS: None  RED FLAGS: None   WEIGHT BEARING RESTRICTIONS: No  FALLS:  Has patient fallen in last 6 months? No  LIVING ENVIRONMENT: Lives with: lives alone Lives in: House/apartment Stairs: No Has following equipment at home: Walker - 4 wheeled, bed side commode, and Grab bars  OCCUPATION: Disability  PLOF: Independent  PATIENT GOALS: Get back to walking, and [facility] [employer]  NEXT MD VISIT: 12/23/23 Dr. Poli  OBJECTIVE:  Note: Objective measures were completed at Evaluation unless otherwise noted.  PATIENT SURVEYS:  LEFS: 35/80=44%    COGNITION: Overall cognitive status: Within functional limits for tasks assessed     SENSATION: WFL  EDEMA:   Swelling L hip  POSTURE: flexed  trunk  and flexed kness  PALPATION: TTP to the peri- hip area  LOWER EXTREMITY ROM:  Active ROM Right eval Left eval  Hip flexion  90d  Hip extension    Hip abduction    Hip adduction    Hip internal rotation    Hip external rotation    Knee flexion    Knee extension 15-20d lacking 15-20d lacking  Ankle dorsiflexion    Ankle plantarflexion    Ankle inversion    Ankle eversion     (Blank rows = not tested)  LOWER EXTREMITY MMT:  MMT  Right eval Left eval  Hip flexion  2+ p  Hip extension  2+ p  Hip abduction  2+ p  Hip adduction    Hip internal rotation    Hip external rotation  3 p  Knee flexion    Knee extension    Ankle dorsiflexion    Ankle plantarflexion    Ankle inversion    Ankle eversion    Strength limited by pain  (Blank rows = not tested)  FUNCTIONAL TESTS:  5 times sit to stand: 18.8 2 minute walk test: TBA  GAIT: Distance walked: 150' Assistive device utilized: Environmental Consultant - 4 wheeled Level of assistance: Modified independence Comments: Decreased pace, forward flexed trunk and kness                                                                                                                             TREATMENT DATE:  12/19/23 *Patient required extra time for exercises due to increased monitoring, reassessment, and rest due to low activity tolerance and/or high irritability of symptoms*  Therapeutic Exercise: HEP reassessment and update  LAQ 2x8x3s blue TB HS curl 2x8x3s Blue TB    Neuromuscular Reeducation: Supine RTB clamshell 2x8x3s Supine RTB marches 2x4x3s Walking with cane x20 ft STS x8 (appx 100 degrees)  Did not do: SLR 2x8x3s (appx 6-8 inches)    12/17/23 *Patient required extra time for exercises due to increased monitoring, reassessment, and rest due to low activity tolerance and/or high irritability of symptoms*  Therapeutic Exercise: HEP reassessment and update  RTB LAQ 2x8x3s, x8x3s GTB RTB HS Curl 2x8x3s, x8x3s GTB GTB PF x8x3s   Neuromuscular Reeducation: Supine glute set x10x3s Supine RTB clamshell 2x6x3s SLR 2x8x3s (appx 6-8 inches)  Did not do: STS     12/11/23 *Patient required extra time for exercises due to increased monitoring, reassessment, and rest due to low activity tolerance and/or high irritability of symptoms*  Therapeutic Exercise: HEP reassessment and update  RTB LAQ 2x8x3s RTB HS Curl 2x8x3s GTB PF 2x8x3s   Neuromuscular  Reeducation: Supine quad set 2x8x3s Supine glute set 2x8x3s SLR x8x3s Heel slide w/ green rope x8x3s       PATIENT EDUCATION:  Education details: Eval findings, POC, HEP, self care  Person educated: Patient Education method: Explanation, Demonstration, Tactile cues, Verbal cues, and Handouts Education comprehension: verbalized understanding, returned demonstration, verbal cues  required, and tactile cues required  HOME EXERCISE PROGRAM: Access Code: [email or web address] URL: [email or web address] Date: 12/04/2023 Prepared by: Dasie Daft  Exercises Red TB clamshells 2x6x3s Blue TB laq 2x6x3s Blue TB HS curl 2x6x3s   - Supine Heel Slide with Strap  - 2-3 x daily - 7 x weekly - 1 sets - 10 reps - 5 hold - Active Straight Leg Raise with Quad Set  - 2-3 x daily - 7 x weekly - 3 sets - 10 reps - 3 hold - Sit to Stand  - 2-3 x daily - 7 x weekly - 3 sets - 10 reps - 3 hold  ASSESSMENT:  CLINICAL IMPRESSION: Patient tolerated treatment with no significant increases in pain with progressions in LLE loading of calf and hip musculature. Current deficits include: excessive pain, functional activity tolerance, and strength, and ROM. As a result, patient would continue to benefit from skilled PT to address said deficits via plan below.   Patient is a 63 y.o. male who was seen today for physical therapy evaluation and treatment for Z96.642 (ICD-10-CM) - Status post total replacement of left hip. Pt presents with the following deficits which are anticipated following a THA: decreased L hip flexion; decreased bilat knee ext; decreased L hip strength, and a decreased level of function, needing a RW for assist to walk. A HEP and self care was provided. Pt will benefit from skilled PT 2w8 to address impairments to optimize L hip/LE function and mobility.   OBJECTIVE IMPAIRMENTS: decreased activity tolerance, decreased balance, difficulty walking, decreased ROM, decreased strength, increased  edema, postural dysfunction, pain, and high BMi.   ACTIVITY LIMITATIONS: carrying, lifting, bending, sitting, standing, squatting, sleeping, stairs, and locomotion level  PARTICIPATION LIMITATIONS: meal prep, cleaning, laundry, shopping, and community activity  PERSONAL FACTORS: Past/current experiences, Social background, and 1-2 comorbidities: high BMI, DM are also affecting patient's functional outcome.   REHAB POTENTIAL: Good  CLINICAL DECISION MAKING: Evolving/moderate complexity  EVALUATION COMPLEXITY: Moderate   GOALS:  SHORT TERM GOALS: Target date: 12/27/23 Pt will be Ind in an initial HEP  Baseline: started Goal status: INITIAL  LONG TERM GOALS: Target date: 02/07/24  Pt will be Ind in a final HEP to maintain achieved LOF Baseline: started Goal status: INITIAL  2.  Increase L hip to 4/10 for improved function Baseline:  Goal status: INITIAL  3.  Increase L hip AROM to 100d for appropriate ability with dressing, asc/dsc steps, and sit to/from standing Baseline: PROM to 90d Goal status: INITIAL  4.  Increase bilat knee ext to lacking 10d for improved gait quality, walking with a more upright posture Baseline: lacking 15-20d Goal status: INITIAL  5.  Improve 5xSTS by MCID of 5 and by MCID of 59ft as indication of improved functional mobility  Baseline: 5xSTS=18.8; to be assessed when pt is able to walk with a SPC Goal status: INITIAL  6.  Pt's LEFS score will improve by the MCID to 59% as indication of improved function  Baseline: 44% Goal status: INITIAL  7.  Pt will Ind with walking for 500 and to asc/dsc 12 steps c a HR for community mobility Baseline: 150' c RW Goal status: INITIAL   PLAN:  PT FREQUENCY: 2x/week  PT DURATION: 8 weeks  PLANNED INTERVENTIONS: 97164- PT Re-evaluation, 97110-Therapeutic exercises, 97530- Therapeutic activity, 97112- Neuromuscular re-education, 97535- Self Care, 02859- Manual therapy, [phone]- Gait training,  [phone]- Aquatic Therapy, [phone]- Electrical stimulation (unattended), Patient/Family education, Balance training, Stair training, Taping, Joint mobilization, Cryotherapy,  and Moist heat  PLAN FOR NEXT SESSION: Assess response to HEP; progress therex as indicated; use of modalities, manual therapy as indicated. Progress proximal L hip strengthening.   [place] Odessia Scot  PT, DPT

## 2023-12-23 ENCOUNTER — Encounter: Payer: Self-pay | Admitting: Orthopaedic Surgery

## 2023-12-23 ENCOUNTER — Ambulatory Visit: Admitting: Orthopaedic Surgery

## 2023-12-23 DIAGNOSIS — Z96642 Presence of left artificial hip joint: Secondary | ICD-10-CM

## 2023-12-23 NOTE — Progress Notes (Signed)
 The patient is now just over 6 weeks status post a left hip replacement to treat significant left hip pain and arthritis.  We replaced his right hip back in 2018.  He is now transition from a walker to a quad cane.  He is still in physical therapy and says they are improving his range of motion and strength.  He is someone who does not drive and does not work.  He is an active 63 year old.  On exam his right hip moves smoothly and fluidly.  The left hip still has some stiffness of motion but overall looks good.  His incision looks fine.  He will slowly increase his activities as comfort allows and hopefully can transition to a home exercise program at some point.  From an orthopedic standpoint, the next and when you see him is in 3 months.  Will just have a standing AP pelvis at that visit.

## 2023-12-24 ENCOUNTER — Ambulatory Visit

## 2023-12-24 DIAGNOSIS — M25552 Pain in left hip: Secondary | ICD-10-CM

## 2023-12-24 DIAGNOSIS — R262 Difficulty in walking, not elsewhere classified: Secondary | ICD-10-CM

## 2023-12-24 DIAGNOSIS — M6281 Muscle weakness (generalized): Secondary | ICD-10-CM

## 2023-12-24 NOTE — Therapy (Signed)
 OUTPATIENT PHYSICAL THERAPY LOWER EXTREMITY EVALUATION   Patient Name: Brandon Finley MRN: [id number] DOB:11-Jul-1960, 63 y.o., male Today's Date: 12/24/2023  END OF SESSION:  PT End of Session - 12/24/23 1113     Visit Number 6    Number of Visits 17    Date for Recertification  02/07/24    Authorization Type [employer] DUAL COMPLETE; MEDICAID OF [place]    Authorization - Number of Visits 17    PT Start Time 1100    PT Stop Time 1140    PT Time Calculation (min) 40 min    Activity Tolerance Patient tolerated treatment well    Behavior During Therapy WFL for tasks assessed/performed               Past Medical History:  Diagnosis Date   Arthritis    Diabetes mellitus without complication (HCC)    Hypertension    Pneumonia    when pt was 63 years old   Past Surgical History:  Procedure Laterality Date   COLONOSCOPY     TOTAL HIP ARTHROPLASTY Right 03/13/2016   Procedure: RIGHT TOTAL HIP ARTHROPLASTY ANTERIOR APPROACH;  Surgeon: [name], MD;  Location: [facility] OR;  Service: Orthopedics;  Laterality: Right;   TOTAL HIP ARTHROPLASTY Left 11/12/2023   Procedure: ARTHROPLASTY, HIP, TOTAL, ANTERIOR APPROACH;  Surgeon: Poli Lonni CINDERELLA, MD;  Location: [facility] OR;  Service: Orthopedics;  Laterality: Left;   Patient Active Problem List   Diagnosis Date Noted   Unilateral primary osteoarthritis, left hip 01/24/2022   Hav (hallux abducto valgus), unspecified laterality 07/05/2021   Hyperlipidemia 07/10/2018   Hypertension 06/22/2016   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   Post-traumatic osteoarthritis of right hip 09/14/2015   Right hip pain 09/14/2015   Type 2 diabetes mellitus (HCC) 07/25/2012   Morbid obesity (HCC) 07/25/2012    PCP: Poli Lonni CINDERELLA, MD  REFERRING PROVIDER: Poli Lonni CINDERELLA, MD  REFERRING DIAG: [phone] (ICD-10-CM) - Status post total replacement of left hip   THERAPY DIAG:  Pain in left  hip  Muscle weakness (generalized)  Difficulty in walking, not elsewhere classified  Rationale for Evaluation and Treatment: Rehabilitation  ONSET DATE: 11/12/23  SUBJECTIVE:   SUBJECTIVE STATEMENT: 3/10 in hip today. Has been feeling better and doing good with HEP.  Pt reports he received HHPT for about 2 weeks. He notes seeing Dr. Poli last week and the staples were removed. He is using cold packs to manage the pain and swelling.  PERTINENT HISTORY: R THA 2018, high BMI, DM  PAIN:  Are you having pain? Yes: NPRS scale: 5/10 Pain location: L hip Pain description: ache, sharp Aggravating factors: certain movements of the L hip Relieving factors: Cold pack, pain medication, muscle relaxors  PRECAUTIONS: None  RED FLAGS: None   WEIGHT BEARING RESTRICTIONS: No  FALLS:  Has patient fallen in last 6 months? No  LIVING ENVIRONMENT: Lives with: lives alone Lives in: House/apartment Stairs: No Has following equipment at home: Walker - 4 wheeled, bed side commode, and Grab bars  OCCUPATION: Disability  PLOF: Independent  PATIENT GOALS: Get back to walking, and [facility] [employer]  NEXT MD VISIT: 12/23/23 Dr. Poli  OBJECTIVE:  Note: Objective measures were completed at Evaluation unless otherwise noted.  PATIENT SURVEYS:  LEFS: 35/80=44%    COGNITION: Overall cognitive status: Within functional limits for tasks assessed     SENSATION: WFL  EDEMA:   Swelling L hip  POSTURE: flexed trunk  and flexed kness  PALPATION: TTP to the peri- hip area  LOWER EXTREMITY ROM:  Active ROM Right eval Left eval  Hip flexion  90d  Hip extension    Hip abduction    Hip adduction    Hip internal rotation    Hip external rotation    Knee flexion    Knee extension 15-20d lacking 15-20d lacking  Ankle dorsiflexion    Ankle plantarflexion    Ankle inversion    Ankle eversion     (Blank rows = not tested)  LOWER EXTREMITY MMT:  MMT  Right eval Left eval  Hip flexion  2+ p  Hip extension  2+ p  Hip abduction  2+ p  Hip adduction    Hip internal rotation    Hip external rotation  3 p  Knee flexion    Knee extension    Ankle dorsiflexion    Ankle plantarflexion    Ankle inversion    Ankle eversion    Strength limited by pain  (Blank rows = not tested)  FUNCTIONAL TESTS:  5 times sit to stand: 18.8 2 minute walk test: TBA  GAIT: Distance walked: 150' Assistive device utilized: Environmental Consultant - 4 wheeled Level of assistance: Modified independence Comments: Decreased pace, forward flexed trunk and kness                                                                                                                              TREATMENT DATE:  12/24/23: Therapeutic Exercise: HEP reassessment and update Seated march 2x6x3s Seated GTB clamshell 2x6x3s SLR 2x8x3s (appx 8 inches)   Neuromuscular Reeducation:  Ambulate 200' holding cane R hand with improving balance Stairs up and down no cane (B UE > S UE >no hands) w/ CG STS x8 (lowest table; cuing on even pushing/balance with legs)  Did not do: Core Step ups Sts with weight        12/19/23 *Patient required extra time for exercises due to increased monitoring, reassessment, and rest due to low activity tolerance and/or high irritability of symptoms*  Therapeutic Exercise: HEP reassessment and update  LAQ 2x8x3s blue TB HS curl 2x8x3s Blue TB    Neuromuscular Reeducation: Supine RTB clamshell 2x8x3s Supine RTB marches 2x4x3s Walking with cane x20 ft STS x8 (appx 100 degrees)  Did not do: SLR 2x8x3s (appx 6-8 inches)      PATIENT EDUCATION:  Education details: Eval findings, POC, HEP, self care  Person educated: Patient Education method: Explanation, Demonstration, Tactile cues, Verbal cues, and Handouts Education comprehension: verbalized understanding, returned demonstration, verbal cues required, and tactile cues required  HOME  EXERCISE PROGRAM: Access Code: [email or web address] URL: [email or web address] Date: 12/04/2023 Prepared by: Dasie Daft  Exercises Green TB clamshells 2x6x3s Blue TB laq 2x6x3s Blue TB HS curl 2x6x3s STS SLR   - Supine Heel Slide with Strap  - 2-3 x daily - 7 x weekly - 1 sets - 10 reps - 5 hold -  Active Straight Leg Raise with Quad Set  - 2-3 x daily - 7 x weekly - 3 sets - 10 reps - 3 hold - Sit to Stand  - 2-3 x daily - 7 x weekly - 3 sets - 10 reps - 3 hold  ASSESSMENT:  CLINICAL IMPRESSION: Patient tolerated treatment with no significant increases in pain with progressions in LLE loading of calf and hip musculature. Progressed ambulating without cane and stair navigation. Current deficits include: excessive pain, functional activity tolerance, strength, and ROM. As a result, patient would continue to benefit from skilled PT to address said deficits via plan below.   Patient is a 63 y.o. male who was seen today for physical therapy evaluation and treatment for Z96.642 (ICD-10-CM) - Status post total replacement of left hip. Pt presents with the following deficits which are anticipated following a THA: decreased L hip flexion; decreased bilat knee ext; decreased L hip strength, and a decreased level of function, needing a RW for assist to walk. A HEP and self care was provided. Pt will benefit from skilled PT 2w8 to address impairments to optimize L hip/LE function and mobility.   OBJECTIVE IMPAIRMENTS: decreased activity tolerance, decreased balance, difficulty walking, decreased ROM, decreased strength, increased edema, postural dysfunction, pain, and high BMi.   ACTIVITY LIMITATIONS: carrying, lifting, bending, sitting, standing, squatting, sleeping, stairs, and locomotion level  PARTICIPATION LIMITATIONS: meal prep, cleaning, laundry, shopping, and community activity  PERSONAL FACTORS: Past/current experiences, Social background, and 1-2 comorbidities: high BMI, DM are also  affecting patient's functional outcome.   REHAB POTENTIAL: Good  CLINICAL DECISION MAKING: Evolving/moderate complexity  EVALUATION COMPLEXITY: Moderate   GOALS:  SHORT TERM GOALS: Target date: 12/27/23 Pt will be Ind in an initial HEP  Baseline: started Goal status: INITIAL  LONG TERM GOALS: Target date: 02/07/24  Pt will be Ind in a final HEP to maintain achieved LOF Baseline: started Goal status: INITIAL  2.  Increase L hip to 4/10 for improved function Baseline:  Goal status: INITIAL  3.  Increase L hip AROM to 100d for appropriate ability with dressing, asc/dsc steps, and sit to/from standing Baseline: PROM to 90d Goal status: INITIAL  4.  Increase bilat knee ext to lacking 10d for improved gait quality, walking with a more upright posture Baseline: lacking 15-20d Goal status: INITIAL  5.  Improve 5xSTS by MCID of 5 and by MCID of 29ft as indication of improved functional mobility  Baseline: 5xSTS=18.8; to be assessed when pt is able to walk with a SPC Goal status: INITIAL  6.  Pt's LEFS score will improve by the MCID to 59% as indication of improved function  Baseline: 44% Goal status: INITIAL  7.  Pt will Ind with walking for 500 and to asc/dsc 12 steps c a HR for community mobility Baseline: 150' c RW Goal status: INITIAL   PLAN:  PT FREQUENCY: 2x/week  PT DURATION: 8 weeks  PLANNED INTERVENTIONS: 97164- PT Re-evaluation, 97110-Therapeutic exercises, 97530- Therapeutic activity, 97112- Neuromuscular re-education, 97535- Self Care, 02859- Manual therapy, [phone]- Gait training, [phone]- Aquatic Therapy, [phone]- Electrical stimulation (unattended), Patient/Family education, Balance training, Stair training, Taping, Joint mobilization, Cryotherapy, and Moist heat  PLAN FOR NEXT SESSION: Assess response to HEP; progress therex as indicated; use of modalities, manual therapy as indicated. Progress proximal L hip strengthening via isolated and  functional activities.   [place] Odessia Scot  PT, DPT

## 2023-12-31 NOTE — Therapy (Signed)
 OUTPATIENT PHYSICAL THERAPY LOWER EXTREMITY EVALUATION   Patient Name: Brandon Finley MRN: [id number] DOB:August 26, 1960, 63 y.o., male Today's Date: 01/01/2024  END OF SESSION:  PT End of Session - 01/01/24 1428     Visit Number 7    Number of Visits 17    Date for Recertification  02/07/24    Authorization Type [employer] DUAL COMPLETE; MEDICAID OF [place]    PT Start Time 1420    PT Stop Time 1500    PT Time Calculation (min) 40 min    Activity Tolerance Patient tolerated treatment well    Behavior During Therapy WFL for tasks assessed/performed                Past Medical History:  Diagnosis Date   Arthritis    Diabetes mellitus without complication (HCC)    Hypertension    Pneumonia    when pt was 63 years old   Past Surgical History:  Procedure Laterality Date   COLONOSCOPY     TOTAL HIP ARTHROPLASTY Right 03/13/2016   Procedure: RIGHT TOTAL HIP ARTHROPLASTY ANTERIOR APPROACH;  Surgeon: [name], MD;  Location: [facility];  Service: Orthopedics;  Laterality: Right;   TOTAL HIP ARTHROPLASTY Left 11/12/2023   Procedure: ARTHROPLASTY, HIP, TOTAL, ANTERIOR APPROACH;  Surgeon: Poli Lonni CINDERELLA, MD;  Location: [facility];  Service: Orthopedics;  Laterality: Left;   Patient Active Problem List   Diagnosis Date Noted   Unilateral primary osteoarthritis, left hip 01/24/2022   Hav (hallux abducto valgus), unspecified laterality 07/05/2021   Hyperlipidemia 07/10/2018   Hypertension 06/22/2016   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   Post-traumatic osteoarthritis of right hip 09/14/2015   Right hip pain 09/14/2015   Type 2 diabetes mellitus (HCC) 07/25/2012   Morbid obesity (HCC) 07/25/2012    PCP: Poli Lonni CINDERELLA, MD  REFERRING PROVIDER: Poli Lonni CINDERELLA, MD  REFERRING DIAG: [phone] (ICD-10-CM) - Status post total replacement of left hip   THERAPY DIAG:  Pain in left hip  Muscle weakness  (generalized)  Difficulty in walking, not elsewhere classified  Localized edema  Rationale for Evaluation and Treatment: Rehabilitation  ONSET DATE: 11/12/23  SUBJECTIVE:   SUBJECTIVE STATEMENT:  Pt reports he is pleased with his progress.  Pt reports he received HHPT for about 2 weeks. He notes seeing Dr. Poli last week and the staples were removed. He is using cold packs to manage the pain and swelling.  PERTINENT HISTORY: R THA 2018, high BMI, DM  PAIN:  Are you having pain? Yes: NPRS scale: Current: 3/10 Pain location: L hip Pain description: ache, sharp Aggravating factors: certain movements of the L hip Relieving factors: Cold pack, pain medication, muscle relaxors  PRECAUTIONS: None  RED FLAGS: None   WEIGHT BEARING RESTRICTIONS: No  FALLS:  Has patient fallen in last 6 months? No  LIVING ENVIRONMENT: Lives with: lives alone Lives in: House/apartment Stairs: No Has following equipment at home: Walker - 4 wheeled, bed side commode, and Grab bars  OCCUPATION: Disability  PLOF: Independent  PATIENT GOALS: Get back to walking, and [facility] [employer]  NEXT MD VISIT: 12/23/23 Dr. Poli  OBJECTIVE:  Note: Objective measures were completed at Evaluation unless otherwise noted.  PATIENT SURVEYS:  LEFS: 35/80=44%    COGNITION: Overall cognitive status: Within functional limits for tasks assessed     SENSATION: WFL  EDEMA:   Swelling L hip  POSTURE: flexed trunk  and flexed kness  PALPATION: TTP to  the peri- hip area  LOWER EXTREMITY ROM:  Active ROM Right eval Left eval  Hip flexion  90d  Hip extension    Hip abduction    Hip adduction    Hip internal rotation    Hip external rotation    Knee flexion    Knee extension 15-20d lacking 15-20d lacking  Ankle dorsiflexion    Ankle plantarflexion    Ankle inversion    Ankle eversion     (Blank rows = not tested)  LOWER EXTREMITY MMT:  MMT Right eval  Left eval  Hip flexion  2+ p  Hip extension  2+ p  Hip abduction  2+ p  Hip adduction    Hip internal rotation    Hip external rotation  3 p  Knee flexion    Knee extension    Ankle dorsiflexion    Ankle plantarflexion    Ankle inversion    Ankle eversion    Strength limited by pain  (Blank rows = not tested)  FUNCTIONAL TESTS:  5 times sit to stand: 18.8 2 minute walk test: TBA  GAIT: Distance walked: 150' Assistive device utilized: Environmental Consultant - 4 wheeled Level of assistance: Modified independence Comments: Decreased pace, forward flexed trunk and kness   [facility] Adult PT Treatment:                                                DATE: 01/01/24 Therapeutic Activity: NuStep 45m L5 UE/LE 280' s SBQC Gait training for L for ankle DF to avoid the R toe from catching when advancing, walking s SBQC Standing Heel raise/toe lifts 2x10, hand assist Gastroc stretch x2 30  Standing hip abd, hand assist Standing hip ext, hand assist                                                                                                                           TREATMENT DATE:  12/24/23: Therapeutic Exercise: HEP reassessment and update Seated march 2x6x3s Seated GTB clamshell 2x6x3s SLR 2x8x3s (appx 8 inches)   Neuromuscular Reeducation:  Ambulate 200' holding cane R hand with improving balance Stairs up and down no cane (B UE > S UE >no hands) w/ CG STS x8 (lowest table; cuing on even pushing/balance with legs)  Did not do: Core Step ups Sts with weight  PATIENT EDUCATION:  Education details: Eval findings, POC, HEP, self care  Person educated: Patient Education method: Explanation, Demonstration, Tactile cues, Verbal cues, and Handouts Education comprehension: verbalized understanding, returned demonstration, verbal cues required, and tactile cues required  HOME EXERCISE PROGRAM: Access Code: [email or web address] URL: [email or web address] Date: 12/04/2023 Prepared  by: Dasie Daft  Exercises Green TB clamshells 2x6x3s Blue TB laq 2x6x3s Blue TB HS curl 2x6x3s STS SLR   - Supine Heel Slide with Strap  - 2-3 x daily - 7  x weekly - 1 sets - 10 reps - 5 hold - Active Straight Leg Raise with Quad Set  - 2-3 x daily - 7 x weekly - 3 sets - 10 reps - 3 hold - Sit to Stand  - 2-3 x daily - 7 x weekly - 3 sets - 10 reps - 3 hold  ASSESSMENT:  CLINICAL IMPRESSION: PT was completed for gait training s an AD. Over a distance of 280', the R toes caught the floor several times while advancing the R LE. Both ankles have decreased DF AROM, also L hip weakness probably contributed to the issue. If the patient thought about DFing his R foot, he was able to walk without catching his R toes. Recommended pt continue to use his SBQC outside the home, but he could walk s the [facility] in his home. Exs were completed to address ankle ROM and strength, and hip strength. Pt tolerated PT today without adverse effects.   Patient is a 63 y.o. male who was seen today for physical therapy evaluation and treatment for Z96.642 (ICD-10-CM) - Status post total replacement of left hip. Pt presents with the following deficits which are anticipated following a THA: decreased L hip flexion; decreased bilat knee ext; decreased L hip strength, and a decreased level of function, needing a RW for assist to walk. A HEP and self care was provided. Pt will benefit from skilled PT 2w8 to address impairments to optimize L hip/LE function and mobility.   OBJECTIVE IMPAIRMENTS: decreased activity tolerance, decreased balance, difficulty walking, decreased ROM, decreased strength, increased edema, postural dysfunction, pain, and high BMi.   ACTIVITY LIMITATIONS: carrying, lifting, bending, sitting, standing, squatting, sleeping, stairs, and locomotion level  PARTICIPATION LIMITATIONS: meal prep, cleaning, laundry, shopping, and community activity  PERSONAL FACTORS: Past/current experiences, Social  background, and 1-2 comorbidities: high BMI, DM are also affecting patient's functional outcome.   REHAB POTENTIAL: Good  CLINICAL DECISION MAKING: Evolving/moderate complexity  EVALUATION COMPLEXITY: Moderate   GOALS:  SHORT TERM GOALS: Target date: 12/27/23 Pt will be Ind in an initial HEP  Baseline: started Goal status: MET  LONG TERM GOALS: Target date: 02/07/24  Pt will be Ind in a final HEP to maintain achieved LOF Baseline: started Goal status: INITIAL  2.  Increase L hip to 4/10 for improved function Baseline:  Goal status: INITIAL  3.  Increase L hip AROM to 100d for appropriate ability with dressing, asc/dsc steps, and sit to/from standing Baseline: PROM to 90d Goal status: INITIAL  4.  Increase bilat knee ext to lacking 10d for improved gait quality, walking with a more upright posture Baseline: lacking 15-20d Goal status: INITIAL  5.  Improve 5xSTS by MCID of 5 and by MCID of 98ft as indication of improved functional mobility  Baseline: 5xSTS=18.8; to be assessed when pt is able to walk with a [facility] 01/01/24: 2MWT= 280 s AD Goal status: INITIAL  6.  Pt's LEFS score will improve by the MCID to 59% as indication of improved function  Baseline: 44% Goal status: INITIAL  7.  Pt will Ind with walking for 500 and to asc/dsc 12 steps c a HR for community mobility Baseline: 150' c RW Goal status: INITIAL   PLAN:  PT FREQUENCY: 2x/week  PT DURATION: 8 weeks  PLANNED INTERVENTIONS: 97164- PT Re-evaluation, 97110-Therapeutic exercises, 97530- Therapeutic activity, 97112- Neuromuscular re-education, 97535- Self Care, 02859- Manual therapy, [phone]- Gait training, [phone]- Aquatic Therapy, [phone]- Electrical stimulation (unattended), Patient/Family education, Balance training, Stair  training, Taping, Joint mobilization, Cryotherapy, and Moist heat  PLAN FOR NEXT SESSION: Assess response to HEP; progress therex as indicated; use of modalities, manual therapy  as indicated. Progress proximal L hip strengthening via isolated and functional activities.   [place] Odessia Scot  PT, DPT

## 2024-01-01 ENCOUNTER — Ambulatory Visit

## 2024-01-01 DIAGNOSIS — R600 Localized edema: Secondary | ICD-10-CM

## 2024-01-01 DIAGNOSIS — R262 Difficulty in walking, not elsewhere classified: Secondary | ICD-10-CM

## 2024-01-01 DIAGNOSIS — M25552 Pain in left hip: Secondary | ICD-10-CM | POA: Diagnosis not present

## 2024-01-01 DIAGNOSIS — M6281 Muscle weakness (generalized): Secondary | ICD-10-CM

## 2024-01-02 ENCOUNTER — Encounter: Payer: Self-pay | Admitting: Physical Therapy

## 2024-01-02 ENCOUNTER — Ambulatory Visit: Admitting: Physical Therapy

## 2024-01-02 DIAGNOSIS — M25552 Pain in left hip: Secondary | ICD-10-CM | POA: Diagnosis not present

## 2024-01-02 DIAGNOSIS — M6281 Muscle weakness (generalized): Secondary | ICD-10-CM

## 2024-01-02 NOTE — Therapy (Signed)
 OUTPATIENT PHYSICAL THERAPY LOWER EXTREMITY TREATMENT   Patient Name: Brandon Finley MRN: [id number] DOB:December 13, 1960, 63 y.o., male Today's Date: 01/02/2024  END OF SESSION:  PT End of Session - 01/02/24 0716     Visit Number 8    Number of Visits 17    Date for Recertification  02/07/24    Authorization Type [employer] DUAL COMPLETE; MEDICAID OF [place]    PT Start Time 0717    PT Stop Time 0800    PT Time Calculation (min) 43 min                Past Medical History:  Diagnosis Date   Arthritis    Diabetes mellitus without complication (HCC)    Hypertension    Pneumonia    when pt was 63 years old   Past Surgical History:  Procedure Laterality Date   COLONOSCOPY     TOTAL HIP ARTHROPLASTY Right 03/13/2016   Procedure: RIGHT TOTAL HIP ARTHROPLASTY ANTERIOR APPROACH;  Surgeon: [name], MD;  Location: [facility] OR;  Service: Orthopedics;  Laterality: Right;   TOTAL HIP ARTHROPLASTY Left 11/12/2023   Procedure: ARTHROPLASTY, HIP, TOTAL, ANTERIOR APPROACH;  Surgeon: Poli Lonni CINDERELLA, MD;  Location: [facility] OR;  Service: Orthopedics;  Laterality: Left;   Patient Active Problem List   Diagnosis Date Noted   Unilateral primary osteoarthritis, left hip 01/24/2022   Hav (hallux abducto valgus), unspecified laterality 07/05/2021   Hyperlipidemia 07/10/2018   Hypertension 06/22/2016   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   Post-traumatic osteoarthritis of right hip 09/14/2015   Right hip pain 09/14/2015   Type 2 diabetes mellitus (HCC) 07/25/2012   Morbid obesity (HCC) 07/25/2012    PCP: Poli Lonni CINDERELLA, MD  REFERRING PROVIDER: Poli Lonni CINDERELLA, MD  REFERRING DIAG: [phone] (ICD-10-CM) - Status post total replacement of left hip   THERAPY DIAG:  Pain in left hip  Muscle weakness (generalized)  Rationale for Evaluation and Treatment: Rehabilitation  ONSET DATE: 11/12/23  SUBJECTIVE:   SUBJECTIVE  STATEMENT:  Pt reports his pain 3/10.   Pt reports he received HHPT for about 2 weeks. He notes seeing Dr. Poli last week and the staples were removed. He is using cold packs to manage the pain and swelling.  PERTINENT HISTORY: R THA 2018, high BMI, DM  PAIN:  Are you having pain? Yes: NPRS scale: Current: 3/10 Pain location: L hip Pain description: ache, sharp Aggravating factors: certain movements of the L hip Relieving factors: Cold pack, pain medication, muscle relaxors  PRECAUTIONS: None  RED FLAGS: None   WEIGHT BEARING RESTRICTIONS: No  FALLS:  Has patient fallen in last 6 months? No  LIVING ENVIRONMENT: Lives with: lives alone Lives in: House/apartment Stairs: No Has following equipment at home: Walker - 4 wheeled, bed side commode, and Grab bars  OCCUPATION: Disability  PLOF: Independent  PATIENT GOALS: Get back to walking, and [facility] [employer]  NEXT MD VISIT: 12/23/23 Dr. Poli  OBJECTIVE:  Note: Objective measures were completed at Evaluation unless otherwise noted.  PATIENT SURVEYS:  LEFS: 35/80=44%    COGNITION: Overall cognitive status: Within functional limits for tasks assessed     SENSATION: WFL  EDEMA:   Swelling L hip  POSTURE: flexed trunk  and flexed kness  PALPATION: TTP to the peri- hip area  LOWER EXTREMITY ROM:  Active ROM Right eval Left eval  Hip flexion  90d  Hip extension    Hip abduction  Hip adduction    Hip internal rotation    Hip external rotation    Knee flexion    Knee extension 15-20d lacking 15-20d lacking  Ankle dorsiflexion    Ankle plantarflexion    Ankle inversion    Ankle eversion     (Blank rows = not tested)  LOWER EXTREMITY MMT:  MMT Right eval Left eval  Hip flexion  2+ p  Hip extension  2+ p  Hip abduction  2+ p  Hip adduction    Hip internal rotation    Hip external rotation  3 p  Knee flexion    Knee extension    Ankle dorsiflexion    Ankle  plantarflexion    Ankle inversion    Ankle eversion    Strength limited by pain  (Blank rows = not tested)  FUNCTIONAL TESTS:  5 times sit to stand: 18.8 2 minute walk test: TBA  GAIT: Distance walked: 150' Assistive device utilized: Environmental Consultant - 4 wheeled Level of assistance: Modified independence Comments: Decreased pace, forward flexed trunk and kness   [facility] Adult PT Treatment:                                                DATE: 01/02/24 Therapeutic Exercise: Nustep L5 UE/LE x 9 minutes  Heel raise  Toe raise  Gastroc stretch 15 sec  Standing hip abdct 5 x 2  Standing hip ext 5 x 2  Standing march 5  x 2  Bridge x 8  Supine march  SLR with strap assist 2 x 8      [facility] Adult PT Treatment:                                                DATE: 01/01/24 Therapeutic Activity: NuStep 54m L5 UE/LE 280' s [facility] Gait training for L for ankle DF to avoid the R toe from catching when advancing, walking s SBQC Standing Heel raise/toe lifts 2x10, hand assist Gastroc stretch x2 30  Standing hip abd, hand assist Standing hip ext, hand assist                                                                                                                           TREATMENT DATE:  12/24/23: Therapeutic Exercise: HEP reassessment and update Seated march 2x6x3s Seated GTB clamshell 2x6x3s SLR 2x8x3s (appx 8 inches)   Neuromuscular Reeducation:  Ambulate 200' holding cane R hand with improving balance Stairs up and down no cane (B UE > S UE >no hands) w/ CG STS x8 (lowest table; cuing on even pushing/balance with legs)  Did not do: Core Step ups Sts with weight  PATIENT EDUCATION:  Education  details: Eval findings, POC, HEP, self care  Person educated: Patient Education method: Explanation, Demonstration, Tactile cues, Verbal cues, and Handouts Education comprehension: verbalized understanding, returned demonstration, verbal cues required, and tactile cues  required  HOME EXERCISE PROGRAM: Access Code: [email or web address] URL: [email or web address] Date: 12/04/2023 Prepared by: Dasie Daft  Exercises Green TB clamshells 2x6x3s Blue TB laq 2x6x3s Blue TB HS curl 2x6x3s STS SLR   - Supine Heel Slide with Strap  - 2-3 x daily - 7 x weekly - 1 sets - 10 reps - 5 hold - Active Straight Leg Raise with Quad Set  - 2-3 x daily - 7 x weekly - 3 sets - 10 reps - 3 hold - Sit to Stand  - 2-3 x daily - 7 x weekly - 3 sets - 10 reps - 3 hold  ASSESSMENT:  CLINICAL IMPRESSION: Pt arrives with [facility]. He reports improvement in hip pain and function. Continued with calf stretching to improve DF as he does tend to catch his toes when ambulating. Also continued strengthening the left hip. SLR needs assistance to lift into full ROM.  Pt tolerated PT today without adverse effects.   Patient is a 63 y.o. male who was seen today for physical therapy evaluation and treatment for Z96.642 (ICD-10-CM) - Status post total replacement of left hip. Pt presents with the following deficits which are anticipated following a THA: decreased L hip flexion; decreased bilat knee ext; decreased L hip strength, and a decreased level of function, needing a RW for assist to walk. A HEP and self care was provided. Pt will benefit from skilled PT 2w8 to address impairments to optimize L hip/LE function and mobility.   OBJECTIVE IMPAIRMENTS: decreased activity tolerance, decreased balance, difficulty walking, decreased ROM, decreased strength, increased edema, postural dysfunction, pain, and high BMi.   ACTIVITY LIMITATIONS: carrying, lifting, bending, sitting, standing, squatting, sleeping, stairs, and locomotion level  PARTICIPATION LIMITATIONS: meal prep, cleaning, laundry, shopping, and community activity  PERSONAL FACTORS: Past/current experiences, Social background, and 1-2 comorbidities: high BMI, DM are also affecting patient's functional outcome.   REHAB POTENTIAL:  Good  CLINICAL DECISION MAKING: Evolving/moderate complexity  EVALUATION COMPLEXITY: Moderate   GOALS:  SHORT TERM GOALS: Target date: 12/27/23 Pt will be Ind in an initial HEP  Baseline: started Goal status: MET  LONG TERM GOALS: Target date: 02/07/24  Pt will be Ind in a final HEP to maintain achieved LOF Baseline: started Goal status: INITIAL  2.  Increase L hip to 4/10 for improved function Baseline:  Goal status: INITIAL  3.  Increase L hip AROM to 100d for appropriate ability with dressing, asc/dsc steps, and sit to/from standing Baseline: PROM to 90d Goal status: INITIAL  4.  Increase bilat knee ext to lacking 10d for improved gait quality, walking with a more upright posture Baseline: lacking 15-20d Goal status: INITIAL  5.  Improve 5xSTS by MCID of 5 and by MCID of 67ft as indication of improved functional mobility  Baseline: 5xSTS=18.8; to be assessed when pt is able to walk with a [facility] 01/01/24: 2MWT= 280 s AD Goal status: INITIAL  6.  Pt's LEFS score will improve by the MCID to 59% as indication of improved function  Baseline: 44% Goal status: INITIAL  7.  Pt will Ind with walking for 500 and to asc/dsc 12 steps c a HR for community mobility Baseline: 150' c RW Goal status: INITIAL   PLAN:  PT FREQUENCY: 2x/week  PT DURATION: 8 weeks  PLANNED  INTERVENTIONS: 97164- PT Re-evaluation, 97110-Therapeutic exercises, 97530- Therapeutic activity, [id number]- Neuromuscular re-education, [phone]- Self Care, 02859- Manual therapy, [phone]- Gait training, [phone]- Aquatic Therapy, [phone]- Electrical stimulation (unattended), Patient/Family education, Balance training, Stair training, Taping, Joint mobilization, Cryotherapy, and Moist heat  PLAN FOR NEXT SESSION: Assess response to HEP; progress therex as indicated; use of modalities, manual therapy as indicated. Progress proximal L hip strengthening via isolated and functional activities. Calf stretching     Harlene Persons, PTA 01/02/2024 8:54 AM Phone: [phone] Fax: [phone]

## 2024-01-06 ENCOUNTER — Ambulatory Visit

## 2024-01-06 DIAGNOSIS — M6281 Muscle weakness (generalized): Secondary | ICD-10-CM

## 2024-01-06 DIAGNOSIS — M25552 Pain in left hip: Secondary | ICD-10-CM

## 2024-01-06 DIAGNOSIS — R262 Difficulty in walking, not elsewhere classified: Secondary | ICD-10-CM

## 2024-01-06 NOTE — Therapy (Signed)
 " OUTPATIENT PHYSICAL THERAPY LOWER EXTREMITY TREATMENT   Patient Name: Brandon Finley MRN: [id number] DOB:January 04, 1961, 63 y.o., male Today's Date: 01/06/2024  END OF SESSION:  PT End of Session - 01/06/24 1408     Visit Number 9    Number of Visits 17    Date for Recertification  02/07/24    Authorization Type [employer] DUAL COMPLETE; MEDICAID OF [place]    PT Start Time 1400    PT Stop Time 1440    PT Time Calculation (min) 40 min    Activity Tolerance Patient tolerated treatment well                 Past Medical History:  Diagnosis Date   Arthritis    Diabetes mellitus without complication (HCC)    Hypertension    Pneumonia    when pt was 63 years old   Past Surgical History:  Procedure Laterality Date   COLONOSCOPY     TOTAL HIP ARTHROPLASTY Right 03/13/2016   Procedure: RIGHT TOTAL HIP ARTHROPLASTY ANTERIOR APPROACH;  Surgeon: [name], MD;  Location: [facility];  Service: Orthopedics;  Laterality: Right;   TOTAL HIP ARTHROPLASTY Left 11/12/2023   Procedure: ARTHROPLASTY, HIP, TOTAL, ANTERIOR APPROACH;  Surgeon: Poli Lonni CINDERELLA, MD;  Location: [facility];  Service: Orthopedics;  Laterality: Left;   Patient Active Problem List   Diagnosis Date Noted   Unilateral primary osteoarthritis, left hip 01/24/2022   Hav (hallux abducto valgus), unspecified laterality 07/05/2021   Hyperlipidemia 07/10/2018   Hypertension 06/22/2016   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   Post-traumatic osteoarthritis of right hip 09/14/2015   Right hip pain 09/14/2015   Type 2 diabetes mellitus (HCC) 07/25/2012   Morbid obesity (HCC) 07/25/2012    PCP: Poli Lonni CINDERELLA, MD  REFERRING PROVIDER: Poli Lonni CINDERELLA, MD  REFERRING DIAG: [phone] (ICD-10-CM) - Status post total replacement of left hip   THERAPY DIAG:  Pain in left hip  Muscle weakness (generalized)  Difficulty in walking, not elsewhere classified  Rationale for  Evaluation and Treatment: Rehabilitation  ONSET DATE: 11/12/23  SUBJECTIVE:   SUBJECTIVE STATEMENT:  Pt reports his pain 3/10. HEP compliant and doing well.  Pt reports he received HHPT for about 2 weeks. He notes seeing Dr. Poli last week and the staples were removed. He is using cold packs to manage the pain and swelling.  PERTINENT HISTORY: R THA 2018, high BMI, DM  PAIN:  Are you having pain? Yes: NPRS scale: Current: 3/10 Pain location: L hip Pain description: ache, sharp Aggravating factors: certain movements of the L hip Relieving factors: Cold pack, pain medication, muscle relaxors  PRECAUTIONS: None  RED FLAGS: None   WEIGHT BEARING RESTRICTIONS: No  FALLS:  Has patient fallen in last 6 months? No  LIVING ENVIRONMENT: Lives with: lives alone Lives in: House/apartment Stairs: No Has following equipment at home: Walker - 4 wheeled, bed side commode, and Grab bars  OCCUPATION: Disability  PLOF: Independent  PATIENT GOALS: Get back to walking, and [facility] [employer]  NEXT MD VISIT: 12/23/23 Dr. Poli  OBJECTIVE:  Note: Objective measures were completed at Evaluation unless otherwise noted.  PATIENT SURVEYS:  LEFS: 35/80=44%    COGNITION: Overall cognitive status: Within functional limits for tasks assessed     SENSATION: WFL  EDEMA:   Swelling L hip  POSTURE: flexed trunk  and flexed kness  PALPATION: TTP to the peri- hip area  LOWER EXTREMITY ROM:  Active ROM Right eval Left eval  Hip flexion  90d  Hip extension    Hip abduction    Hip adduction    Hip internal rotation    Hip external rotation    Knee flexion    Knee extension 15-20d lacking 15-20d lacking  Ankle dorsiflexion    Ankle plantarflexion    Ankle inversion    Ankle eversion     (Blank rows = not tested)  LOWER EXTREMITY MMT:  MMT Right eval Left eval  Hip flexion  2+ p  Hip extension  2+ p  Hip abduction  2+ p  Hip adduction     Hip internal rotation    Hip external rotation  3 p  Knee flexion    Knee extension    Ankle dorsiflexion    Ankle plantarflexion    Ankle inversion    Ankle eversion    Strength limited by pain  (Blank rows = not tested)  FUNCTIONAL TESTS:  5 times sit to stand: 18.8 2 minute walk test: TBA  GAIT: Distance walked: 150' Assistive device utilized: Environmental Consultant - 4 wheeled Level of assistance: Modified independence Comments: Decreased pace, forward flexed trunk and kness  01/06/24: Therapeutic Exercise/Activity: HEP reassessment and update Seated GTB clamshell x8x3s, blue TB 2x6x3s SLR 2x8x3s (>1 foot) Seated march blue TB 2x6x3s Blue TB laq 2x8x3s Blue TB HS curl 2x8x3s STS x8 10#   Did not do: Core Step ups Sts with weight Ambulate 200' holding cane R hand with improving balance Stairs up and down no cane (B UE > S UE >no hands) w/ CG    [facility] Adult PT Treatment:                                                DATE: 01/02/24 Therapeutic Exercise: Nustep L5 UE/LE x 9 minutes  Heel raise  Toe raise  Gastroc stretch 15 sec  Standing hip abdct 5 x 2  Standing hip ext 5 x 2  Standing march 5  x 2  Bridge x 8  Supine march  SLR with strap assist 2 x 8      [facility] Adult PT Treatment:                                                DATE: 01/01/24 Therapeutic Activity: NuStep 61m L5 UE/LE 280' s SBQC Gait training for L for ankle DF to avoid the R toe from catching when advancing, walking s SBQC Standing Heel raise/toe lifts 2x10, hand assist Gastroc stretch x2 30  Standing hip abd, hand assist Standing hip ext, hand assist  TREATMENT DATE:  12/24/23: Therapeutic Exercise: HEP reassessment and update Seated march 2x6x3s Seated GTB clamshell 2x6x3s SLR 2x8x3s (appx 8 inches)   Neuromuscular Reeducation:  Ambulate 200' holding cane R  hand with improving balance Stairs up and down no cane (B UE > S UE >no hands) w/ CG STS x8 (lowest table; cuing on even pushing/balance with legs)  Did not do: Core Step ups Sts with weight  PATIENT EDUCATION:  Education details: Eval findings, POC, HEP, self care  Person educated: Patient Education method: Explanation, Demonstration, Tactile cues, Verbal cues, and Handouts Education comprehension: verbalized understanding, returned demonstration, verbal cues required, and tactile cues required  HOME EXERCISE PROGRAM: Access Code: [email or web address] URL: [email or web address] Date: 12/04/2023 Prepared by: Dasie Daft  Exercises BLue TB clamshells 2x6x3s Blue TB marches 2x6x3s Blue TB HS curl 2x8x3s STS 2x6 SLR 2x6x3s   - Supine Heel Slide with Strap  - 2-3 x daily - 7 x weekly - 1 sets - 10 reps - 5 hold - Active Straight Leg Raise with Quad Set  - 2-3 x daily - 7 x weekly - 3 sets - 10 reps - 3 hold - Sit to Stand  - 2-3 x daily - 7 x weekly - 3 sets - 10 reps - 3 hold  ASSESSMENT:  CLINICAL IMPRESSION: Patient tolerated treatment with no significant increases in pain with progressions in BL hip and LE loading. Current deficits include: functional activity tolerance, excessive pain, and LLE strength. As a result, patient would continue to benefit from skilled PT to address said deficits via plan below.    Patient is a 63 y.o. male who was seen today for physical therapy evaluation and treatment for Z96.642 (ICD-10-CM) - Status post total replacement of left hip. Pt presents with the following deficits which are anticipated following a THA: decreased L hip flexion; decreased bilat knee ext; decreased L hip strength, and a decreased level of function, needing a RW for assist to walk. A HEP and self care was provided. Pt will benefit from skilled PT 2w8 to address impairments to optimize L hip/LE function and mobility.   OBJECTIVE IMPAIRMENTS: decreased activity tolerance,  decreased balance, difficulty walking, decreased ROM, decreased strength, increased edema, postural dysfunction, pain, and high BMi.   ACTIVITY LIMITATIONS: carrying, lifting, bending, sitting, standing, squatting, sleeping, stairs, and locomotion level  PARTICIPATION LIMITATIONS: meal prep, cleaning, laundry, shopping, and community activity  PERSONAL FACTORS: Past/current experiences, Social background, and 1-2 comorbidities: high BMI, DM are also affecting patient's functional outcome.   REHAB POTENTIAL: Good  CLINICAL DECISION MAKING: Evolving/moderate complexity  EVALUATION COMPLEXITY: Moderate   GOALS:  SHORT TERM GOALS: Target date: 12/27/23 Pt will be Ind in an initial HEP  Baseline: started Goal status: MET  LONG TERM GOALS: Target date: 02/07/24  Pt will be Ind in a final HEP to maintain achieved LOF Baseline: started Goal status: INITIAL  2.  Increase L hip to 4/10 for improved function Baseline:  Goal status: INITIAL  3.  Increase L hip AROM to 100d for appropriate ability with dressing, asc/dsc steps, and sit to/from standing Baseline: PROM to 90d Goal status: INITIAL  4.  Increase bilat knee ext to lacking 10d for improved gait quality, walking with a more upright posture Baseline: lacking 15-20d Goal status: INITIAL  5.  Improve 5xSTS by MCID of 5 and by MCID of 44ft as indication of improved functional mobility  Baseline: 5xSTS=18.8; to be assessed when pt is able to walk with a [facility]  01/01/24: 2MWT= 280 s AD Goal status: INITIAL  6.  Pt's LEFS score will improve by the MCID to 59% as indication of improved function  Baseline: 44% Goal status: INITIAL  7.  Pt will Ind with walking for 500 and to asc/dsc 12 steps c a HR for community mobility Baseline: 150' c RW Goal status: INITIAL   PLAN:  PT FREQUENCY: 2x/week  PT DURATION: 8 weeks  PLANNED INTERVENTIONS: 97164- PT Re-evaluation, 97110-Therapeutic exercises, 97530- Therapeutic  activity, 97112- Neuromuscular re-education, 97535- Self Care, 02859- Manual therapy, [phone]- Gait training, [phone]- Aquatic Therapy, [phone]- Electrical stimulation (unattended), Patient/Family education, Balance training, Stair training, Taping, Joint mobilization, Cryotherapy, and Moist heat  PLAN FOR NEXT SESSION: Assess response to HEP; progress therex as indicated; use of modalities, manual therapy as indicated. Progress proximal L hip strengthening via isolated and functional activities. Calf stretching    [place] Odessia Scot  PT, DPT   "

## 2024-01-07 ENCOUNTER — Encounter: Payer: Self-pay | Admitting: Physical Therapy

## 2024-01-07 ENCOUNTER — Ambulatory Visit: Admitting: Physical Therapy

## 2024-01-07 DIAGNOSIS — M25552 Pain in left hip: Secondary | ICD-10-CM

## 2024-01-07 DIAGNOSIS — M6281 Muscle weakness (generalized): Secondary | ICD-10-CM

## 2024-01-07 NOTE — Therapy (Addendum)
 " OUTPATIENT PHYSICAL THERAPY LOWER EXTREMITY TREATMENT/Progress Note   Patient Name: Brandon Finley MRN: [id number] DOB:1960/03/13, 63 y.o., male Today's Date: 01/07/2024  Progress Note Reporting Period 12/04/23 to 01/07/24  See note below for Objective Data and Assessment of Progress/Goals.      END OF SESSION:  PT End of Session - 01/07/24 0806     Visit Number 10    Number of Visits 17    Date for Recertification  02/07/24    Authorization Type [employer] DUAL COMPLETE; MEDICAID OF [place]    PT Start Time 0803    PT Stop Time 0841    PT Time Calculation (min) 38 min                 Past Medical History:  Diagnosis Date   Arthritis    Diabetes mellitus without complication (HCC)    Hypertension    Pneumonia    when pt was 63 years old   Past Surgical History:  Procedure Laterality Date   COLONOSCOPY     TOTAL HIP ARTHROPLASTY Right 03/13/2016   Procedure: RIGHT TOTAL HIP ARTHROPLASTY ANTERIOR APPROACH;  Surgeon: Lonni Finley Poli, MD;  Location: [facility] OR;  Service: Orthopedics;  Laterality: Right;   TOTAL HIP ARTHROPLASTY Left 11/12/2023   Procedure: ARTHROPLASTY, HIP, TOTAL, ANTERIOR APPROACH;  Surgeon: Brandon Lonni CINDERELLA, MD;  Location: [facility] OR;  Service: Orthopedics;  Laterality: Left;   Patient Active Problem List   Diagnosis Date Noted   Unilateral primary osteoarthritis, left hip 01/24/2022   Hav (hallux abducto valgus), unspecified laterality 07/05/2021   Hyperlipidemia 07/10/2018   Hypertension 06/22/2016   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   Post-traumatic osteoarthritis of right hip 09/14/2015   Right hip pain 09/14/2015   Type 2 diabetes mellitus (HCC) 07/25/2012   Morbid obesity (HCC) 07/25/2012    PCP: Brandon Lonni CINDERELLA, MD  REFERRING PROVIDER: Poli Lonni CINDERELLA, MD  REFERRING DIAG: [phone] (ICD-10-CM) - Status post total replacement of left hip   THERAPY DIAG:  Pain in left hip  Muscle  weakness (generalized)  Rationale for Evaluation and Treatment: Rehabilitation  ONSET DATE: 11/12/23  SUBJECTIVE:   SUBJECTIVE STATEMENT:  Pt reports his pain 2/10. HEP compliant and doing well.  Pt reports he received HHPT for about 2 weeks. He notes seeing Dr. Poli last week and the staples were removed. He is using cold packs to manage the pain and swelling.  PERTINENT HISTORY: R THA 2018, high BMI, DM  PAIN:  Are you having pain? Yes: NPRS scale: Current: 3/10 Pain location: L hip Pain description: ache, sharp Aggravating factors: certain movements of the L hip Relieving factors: Cold pack, pain medication, muscle relaxors  PRECAUTIONS: None  RED FLAGS: None   WEIGHT BEARING RESTRICTIONS: No  FALLS:  Has patient fallen in last 6 months? No  LIVING ENVIRONMENT: Lives with: lives alone Lives in: House/apartment Stairs: No Has following equipment at home: Walker - 4 wheeled, bed side commode, and Grab bars  OCCUPATION: Disability  PLOF: Independent  PATIENT GOALS: Get back to walking, and [facility] [employer]  NEXT MD VISIT: 12/23/23 Dr. Poli  OBJECTIVE:  Note: Objective measures were completed at Evaluation unless otherwise noted.  PATIENT SURVEYS:  LEFS: 35/80=44%    COGNITION: Overall cognitive status: Within functional limits for tasks assessed     SENSATION: WFL  EDEMA:   Swelling L hip  POSTURE: flexed trunk  and flexed kness  PALPATION: TTP to the  peri- hip area  LOWER EXTREMITY ROM:  Active ROM Right eval Left eval  Hip flexion  90d  Hip extension    Hip abduction    Hip adduction    Hip internal rotation    Hip external rotation    Knee flexion    Knee extension 15-20d lacking 15-20d lacking  Ankle dorsiflexion    Ankle plantarflexion    Ankle inversion    Ankle eversion     (Blank rows = not tested)  LOWER EXTREMITY MMT:  MMT Right eval Left eval  Hip flexion  2+ p  Hip extension  2+ p   Hip abduction  2+ p  Hip adduction    Hip internal rotation    Hip external rotation  3 p  Knee flexion    Knee extension    Ankle dorsiflexion    Ankle plantarflexion    Ankle inversion    Ankle eversion    Strength limited by pain  (Blank rows = not tested)  FUNCTIONAL TESTS:  5 times sit to stand: 18.8 2 minute walk test: TBA  GAIT: Distance walked: 150' Assistive device utilized: Environmental Consultant - 4 wheeled Level of assistance: Modified independence Comments: Decreased pace, forward flexed trunk and kness  [facility] Adult PT Treatment:                                                DATE: 01/07/24 Therapeutic Exercise: Nustep L5 x 7 minutes  6 inch step ups left x 10 Standing lumbar extension 5 sec x 10 SLR 10 x 2 Bridge x 10  SL hip abduction x 8 left  SL clam AROM x 8 left Consider leg press , knee machines   Therapeutic Activity: Gait with [facility] focusing on upright posture STS x 10, with 10# 2 x 10      01/06/24: Therapeutic Exercise/Activity: HEP reassessment and update Seated GTB clamshell x8x3s, blue TB 2x6x3s SLR 2x8x3s (>1 foot) Seated march blue TB 2x6x3s Blue TB laq 2x8x3s Blue TB HS curl 2x8x3s STS x8 10#   Did not do: Core Step ups Sts with weight Ambulate 200' holding cane R hand with improving balance Stairs up and down no cane (B UE > S UE >no hands) w/ CG    [facility] Adult PT Treatment:                                                DATE: 01/02/24 Therapeutic Exercise: Nustep L5 UE/LE x 9 minutes  Heel raise  Toe raise  Gastroc stretch 15 sec  Standing hip abdct 5 x 2  Standing hip ext 5 x 2  Standing march 5  x 2  Bridge x 8  Supine march  SLR with strap assist 2 x 8      [facility] Adult PT Treatment:                                                DATE: 01/01/24 Therapeutic Activity: NuStep 103m L5 UE/LE 280' s [facility] Gait training for L for ankle DF to avoid the R  toe from catching when advancing, walking s [facility] Standing Heel raise/toe  lifts 2x10, hand assist Gastroc stretch x2 30  Standing hip abd, hand assist Standing hip ext, hand assist                                                                                                                           TREATMENT DATE:  12/24/23: Therapeutic Exercise: HEP reassessment and update Seated march 2x6x3s Seated GTB clamshell 2x6x3s SLR 2x8x3s (appx 8 inches)   Neuromuscular Reeducation:  Ambulate 200' holding cane R hand with improving balance Stairs up and down no cane (B UE > S UE >no hands) w/ CG STS x8 (lowest table; cuing on even pushing/balance with legs)  Did not do: Core Step ups Sts with weight  PATIENT EDUCATION:  Education details: Eval findings, POC, HEP, self care  Person educated: Patient Education method: Explanation, Demonstration, Tactile cues, Verbal cues, and Handouts Education comprehension: verbalized understanding, returned demonstration, verbal cues required, and tactile cues required  HOME EXERCISE PROGRAM: Access Code: [email or web address] URL: [email or web address] Date: 12/04/2023 Prepared by: Dasie Daft  Exercises BLue TB clamshells 2x6x3s Blue TB marches 2x6x3s Blue TB HS curl 2x8x3s STS 2x6 SLR 2x6x3s   - Supine Heel Slide with Strap  - 2-3 x daily - 7 x weekly - 1 sets - 10 reps - 5 hold - Active Straight Leg Raise with Quad Set  - 2-3 x daily - 7 x weekly - 3 sets - 10 reps - 3 hold - Sit to Stand  - 2-3 x daily - 7 x weekly - 3 sets - 10 reps - 3 hold  ASSESSMENT:  CLINICAL IMPRESSION: Pt is concerned with his posture while walking, flexes forward. Worked on gait with upright posture and instructed pt in lumbar extension stretches in standing. Continued with targeted left hip strengthening as well as functional strengthening. Pt tolerated session well with no reports of increased pain. He reports worst pain level is now 2-3/10. LTG #2 met.    Patient is a 63 y.o. male who was seen today for physical therapy  evaluation and treatment for Z96.642 (ICD-10-CM) - Status post total replacement of left hip. Pt presents with the following deficits which are anticipated following a THA: decreased L hip flexion; decreased bilat knee ext; decreased L hip strength, and a decreased level of function, needing a RW for assist to walk. A HEP and self care was provided. Pt will benefit from skilled PT 2w8 to address impairments to optimize L hip/LE function and mobility.   OBJECTIVE IMPAIRMENTS: decreased activity tolerance, decreased balance, difficulty walking, decreased ROM, decreased strength, increased edema, postural dysfunction, pain, and high BMi.   ACTIVITY LIMITATIONS: carrying, lifting, bending, sitting, standing, squatting, sleeping, stairs, and locomotion level  PARTICIPATION LIMITATIONS: meal prep, cleaning, laundry, shopping, and community activity  PERSONAL FACTORS: Past/current experiences, Social background, and 1-2 comorbidities: high BMI, DM are also affecting patient's functional outcome.   REHAB POTENTIAL: Good  CLINICAL  DECISION MAKING: Evolving/moderate complexity  EVALUATION COMPLEXITY: Moderate   GOALS:  SHORT TERM GOALS: Target date: 12/27/23 Pt will be Ind in an initial HEP  Baseline: started Goal status: MET  LONG TERM GOALS: Target date: 02/07/24  Pt will be Ind in a final HEP to maintain achieved LOF Baseline: started Goal status: INITIAL  2.  Increase L hip to 4/10 for improved function Baseline:  01/07/24: 2-3/10 Goal status: MET   3.  Increase L hip AROM to 100d for appropriate ability with dressing, asc/dsc steps, and sit to/from standing Baseline: PROM to 90d Goal status: INITIAL  4.  Increase bilat knee ext to lacking 10d for improved gait quality, walking with a more upright posture Baseline: lacking 15-20d Goal status: INITIAL  5.  Improve 5xSTS by MCID of 5 and by MCID of 14ft as indication of improved functional mobility  Baseline: 5xSTS=18.8;  to be assessed when pt is able to walk with a [facility] 01/01/24: 2MWT= 280 s AD Goal status: INITIAL  6.  Pt's LEFS score will improve by the MCID to 59% as indication of improved function  Baseline: 44% Goal status: INITIAL  7.  Pt will Ind with walking for 500 and to asc/dsc 12 steps c a HR for community mobility Baseline: 150' c RW Goal status: INITIAL   PLAN:  PT FREQUENCY: 2x/week  PT DURATION: 8 weeks  PLANNED INTERVENTIONS: 97164- PT Re-evaluation, 97110-Therapeutic exercises, 97530- Therapeutic activity, 97112- Neuromuscular re-education, 97535- Self Care, [id number]- Manual therapy, [phone]- Gait training, [phone]- Aquatic Therapy, [phone]- Electrical stimulation (unattended), Patient/Family education, Balance training, Stair training, Taping, Joint mobilization, Cryotherapy, and Moist heat  PLAN FOR NEXT SESSION: Assess response to HEP; progress therex as indicated; use of modalities, manual therapy as indicated. Progress proximal L hip strengthening via isolated and functional activities. Calf stretching    Harlene Persons, [place] 01/07/2024 8:38 AM Phone: [phone] Fax: [phone]   Dasie Daft MS, PT 01/14/2024 1:18 PM    "

## 2024-01-08 ENCOUNTER — Ambulatory Visit

## 2024-01-13 ENCOUNTER — Ambulatory Visit

## 2024-01-13 DIAGNOSIS — M6281 Muscle weakness (generalized): Secondary | ICD-10-CM

## 2024-01-13 DIAGNOSIS — R262 Difficulty in walking, not elsewhere classified: Secondary | ICD-10-CM

## 2024-01-13 DIAGNOSIS — M25552 Pain in left hip: Secondary | ICD-10-CM | POA: Diagnosis not present

## 2024-01-13 NOTE — Therapy (Signed)
 " OUTPATIENT PHYSICAL THERAPY LOWER EXTREMITY TREATMENT Progress Note Reporting Period 11/19 to 12/29  See note below for Objective Data and Assessment of Progress/Goals.    Patient Name: Brandon Finley MRN: [id number] DOB:April 07, 1960, 63 y.o., male Today's Date: 01/13/2024  END OF SESSION:  PT End of Session - 01/13/24 1406     Visit Number 11    Number of Visits 17    Date for Recertification  02/07/24    Authorization Type [employer] DUAL COMPLETE; MEDICAID OF [place]    PT Start Time 1400    PT Stop Time 1440    PT Time Calculation (min) 40 min    Activity Tolerance Patient tolerated treatment well                  Past Medical History:  Diagnosis Date   Arthritis    Diabetes mellitus without complication (HCC)    Hypertension    Pneumonia    when pt was 63 years old   Past Surgical History:  Procedure Laterality Date   COLONOSCOPY     TOTAL HIP ARTHROPLASTY Right 03/13/2016   Procedure: RIGHT TOTAL HIP ARTHROPLASTY ANTERIOR APPROACH;  Surgeon: [name], MD;  Location: [facility] OR;  Service: Orthopedics;  Laterality: Right;   TOTAL HIP ARTHROPLASTY Left 11/12/2023   Procedure: ARTHROPLASTY, HIP, TOTAL, ANTERIOR APPROACH;  Surgeon: Poli Lonni CINDERELLA, MD;  Location: [facility] OR;  Service: Orthopedics;  Laterality: Left;   Patient Active Problem List   Diagnosis Date Noted   Unilateral primary osteoarthritis, left hip 01/24/2022   Hav (hallux abducto valgus), unspecified laterality 07/05/2021   Hyperlipidemia 07/10/2018   Hypertension 06/22/2016   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   Post-traumatic osteoarthritis of right hip 09/14/2015   Right hip pain 09/14/2015   Type 2 diabetes mellitus (HCC) 07/25/2012   Morbid obesity (HCC) 07/25/2012    PCP: Poli Lonni CINDERELLA, MD  REFERRING PROVIDER: Poli Lonni CINDERELLA, MD  REFERRING DIAG: [phone] (ICD-10-CM) - Status post total replacement of left hip   THERAPY  DIAG:  Pain in left hip  Muscle weakness (generalized)  Difficulty in walking, not elsewhere classified  Rationale for Evaluation and Treatment: Rehabilitation  ONSET DATE: 11/12/23  SUBJECTIVE:   SUBJECTIVE STATEMENT:  Pt reports his pain 3/10. HEP compliant and doing well. Is thinking about going back to pool to resume walking.   Pt reports he received HHPT for about 2 weeks. He notes seeing Dr. Poli last week and the staples were removed. He is using cold packs to manage the pain and swelling.  PERTINENT HISTORY: R THA 2018, high BMI, DM  PAIN:  Are you having pain? Yes: NPRS scale: Current: 3/10 Pain location: L hip Pain description: ache, sharp Aggravating factors: certain movements of the L hip Relieving factors: Cold pack, pain medication, muscle relaxors  PRECAUTIONS: None  RED FLAGS: None   WEIGHT BEARING RESTRICTIONS: No  FALLS:  Has patient fallen in last 6 months? No  LIVING ENVIRONMENT: Lives with: lives alone Lives in: House/apartment Stairs: No Has following equipment at home: Walker - 4 wheeled, bed side commode, and Grab bars  OCCUPATION: Disability  PLOF: Independent  PATIENT GOALS: Get back to walking, and [facility] [employer]  NEXT MD VISIT: 12/23/23 Dr. Poli  OBJECTIVE:  Note: Objective measures were completed at Evaluation unless otherwise noted.  PATIENT SURVEYS:  LEFS: 35/80=44%  LEFS: 70/80 = 87.5%  COGNITION: Overall cognitive status: Within functional limits for tasks assessed  SENSATION: WFL  EDEMA:   Swelling L hip  POSTURE: flexed trunk  and flexed kness  PALPATION: TTP to the peri- hip area  LOWER EXTREMITY ROM:  Active ROM (reassessed 01/13/24) Right eval Left eval  Hip flexion  90d, same  Hip extension    Hip abduction    Hip adduction    Hip internal rotation    Hip external rotation    Knee flexion    Knee extension 15-20d lacking, same 15-20d lacking, same  Ankle  dorsiflexion    Ankle plantarflexion    Ankle inversion    Ankle eversion     (Blank rows = not tested)  LOWER EXTREMITY MMT:  MMT (reassessed 01/13/24) Right eval Left eval  Hip flexion  2+ p, 4-  Hip extension  2+ p, 3  Hip abduction  2+ p, 3  Hip adduction    Hip internal rotation    Hip external rotation  3 p  Knee flexion    Knee extension    Ankle dorsiflexion    Ankle plantarflexion    Ankle inversion    Ankle eversion    Strength limited by pain  (Blank rows = not tested)  FUNCTIONAL TESTS:  5 times sit to stand: 18.8 2 minute walk test: 280  Reassessed 01/13/24 5x STS: 13.45 s 2 MWT: 480'  GAIT: Distance walked: 150' Assistive device utilized: Environmental Consultant - 4 wheeled Level of assistance: Modified independence Comments: Decreased pace, forward flexed trunk and kness  [facility] Adult PT Treatment:  01/13/24 Therapeutic Activity Progress note performed Nustep x56min : 480 ft (not using cane) 5xsts: 13.45s Progress Note                                                 DATE: 01/07/24 Therapeutic Exercise: Nustep L5 x 7 minutes  6 inch step ups left x 10 Standing lumbar extension 5 sec x 10 SLR 10 x 2 Bridge x 10  SL hip abduction x 8 left  SL clam AROM x 8 left Consider leg press , knee machines   Therapeutic Activity: Gait with [facility] focusing on upright posture STS x 10, with 10# 2 x 10      01/06/24: Therapeutic Exercise/Activity: HEP reassessment and update Seated GTB clamshell x8x3s, blue TB 2x6x3s SLR 2x8x3s (>1 foot) Seated march blue TB 2x6x3s Blue TB laq 2x8x3s Blue TB HS curl 2x8x3s STS x8 10#   Did not do: Core Step ups Sts with weight Ambulate 200' holding cane R hand with improving balance Stairs up and down no cane (B UE > S UE >no hands) w/ CG    PATIENT EDUCATION:  Education details: Eval findings, POC, HEP, self care  Person educated: Patient Education method: Explanation, Demonstration, Tactile cues, Verbal cues,  and Handouts Education comprehension: verbalized understanding, returned demonstration, verbal cues required, and tactile cues required  HOME EXERCISE PROGRAM: Access Code: [email or web address] URL: [email or web address] Date: 12/04/2023 Prepared by: Dasie Daft  Exercises BLue TB clamshells 2x6x3s Blue TB marches 2x6x3s Blue TB HS curl 2x8x3s STS 2x6 SLR 2x6x3s    ASSESSMENT:  CLINICAL IMPRESSION: Progress note performed today. Patient tolerated treatment with no increases in pain with progressions in functional activity tolerance (walking without cane). Patient has met some goals and is making good progress in therapy. Current deficits include: isolated L hip strength, excessive pain, and functional strength/balance.  As a result, patient would continue to benefit from skilled PT to address said deficits via plan below.     Patient is a 63 y.o. male who was seen today for physical therapy evaluation and treatment for Z96.642 (ICD-10-CM) - Status post total replacement of left hip. Pt presents with the following deficits which are anticipated following a THA: decreased L hip flexion; decreased bilat knee ext; decreased L hip strength, and a decreased level of function, needing a RW for assist to walk. A HEP and self care was provided. Pt will benefit from skilled PT 2w8 to address impairments to optimize L hip/LE function and mobility.   OBJECTIVE IMPAIRMENTS: decreased activity tolerance, decreased balance, difficulty walking, decreased ROM, decreased strength, increased edema, postural dysfunction, pain, and high BMi.   ACTIVITY LIMITATIONS: carrying, lifting, bending, sitting, standing, squatting, sleeping, stairs, and locomotion level  PARTICIPATION LIMITATIONS: meal prep, cleaning, laundry, shopping, and community activity  PERSONAL FACTORS: Past/current experiences, Social background, and 1-2 comorbidities: high BMI, DM are also affecting patient's functional outcome.    REHAB POTENTIAL: Good  CLINICAL DECISION MAKING: Evolving/moderate complexity  EVALUATION COMPLEXITY: Moderate   GOALS:  SHORT TERM GOALS: Target date: 12/27/23 Pt will be Ind in an initial HEP  Baseline: started Goal status: MET  LONG TERM GOALS: Target date: 02/07/24  Pt will be Ind in a final HEP to maintain achieved LOF Baseline: started Goal status: MET  2.  Increase L hip to 4/10 for improved function Baseline:  01/07/24: 2-3/10 Goal status: MET   3.  Increase L hip AROM to 100d for appropriate ability with dressing, asc/dsc steps, and sit to/from standing Baseline: PROM to 90d Goal status: in progress  4.  Increase bilat knee ext to lacking 10d for improved gait quality, walking with a more upright posture Baseline: lacking 15-20d Goal status: in progress  5.  Improve 5xSTS by MCID of 5 and by MCID of 66ft as indication of improved functional mobility  Baseline: 5xSTS=18.8; to be assessed when pt is able to walk with a [facility] 01/01/24: 2MWT= 280 s AD Goal status: MET  6.  Pt's LEFS score will improve by the MCID to 59% as indication of improved function  Baseline: 44%  Goal status: MET  7.  Pt will Ind with walking for 500 and to asc/dsc 12 steps c a HR for community mobility Baseline: 150' c RW Goal status: in progress   PLAN:  PT FREQUENCY: 2x/week  PT DURATION: 8 weeks  PLANNED INTERVENTIONS: 97164- PT Re-evaluation, 97110-Therapeutic exercises, 97530- Therapeutic activity, 97112- Neuromuscular re-education, 97535- Self Care, 02859- Manual therapy, [phone]- Gait training, [phone]- Aquatic Therapy, [phone]- Electrical stimulation (unattended), Patient/Family education, Balance training, Stair training, Taping, Joint mobilization, Cryotherapy, and Moist heat  PLAN FOR NEXT SESSION: Assess response to HEP; progress therex as indicated; use of modalities, manual therapy as indicated. Progress proximal L hip strengthening via isolated and functional  activities.    [place] Odessia Scot  PT, DPT    "

## 2024-01-14 NOTE — Therapy (Signed)
 " OUTPATIENT PHYSICAL THERAPY LOWER EXTREMITY TREATMENT Progress Note Reporting Period 11/19 to 12/29  See note below for Objective Data and Assessment of Progress/Goals.    Patient Name: Brandon Finley MRN: [id number] DOB:July 27, 1960, 63 y.o., male Today's Date: 01/14/2024  END OF SESSION:            Past Medical History:  Diagnosis Date   Arthritis    Diabetes mellitus without complication (HCC)    Hypertension    Pneumonia    when pt was 63 years old   Past Surgical History:  Procedure Laterality Date   COLONOSCOPY     TOTAL HIP ARTHROPLASTY Right 03/13/2016   Procedure: RIGHT TOTAL HIP ARTHROPLASTY ANTERIOR APPROACH;  Surgeon: Brandon Finley Poli, MD;  Location: [facility];  Service: Orthopedics;  Laterality: Right;   TOTAL HIP ARTHROPLASTY Left 11/12/2023   Procedure: ARTHROPLASTY, HIP, TOTAL, ANTERIOR APPROACH;  Surgeon: Finley Brandon CINDERELLA, MD;  Location: [facility] OR;  Service: Orthopedics;  Laterality: Left;   Patient Active Problem List   Diagnosis Date Noted   Unilateral primary osteoarthritis, left hip 01/24/2022   Hav (hallux abducto valgus), unspecified laterality 07/05/2021   Hyperlipidemia 07/10/2018   Hypertension 06/22/2016   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   Post-traumatic osteoarthritis of right hip 09/14/2015   Right hip pain 09/14/2015   Type 2 diabetes mellitus (HCC) 07/25/2012   Morbid obesity (HCC) 07/25/2012    PCP: Finley Brandon CINDERELLA, MD  REFERRING PROVIDER: Poli Brandon CINDERELLA, MD  REFERRING DIAG: [phone] (ICD-10-CM) - Status post total replacement of left hip   THERAPY DIAG:  No diagnosis found.  Rationale for Evaluation and Treatment: Rehabilitation  ONSET DATE: 11/12/23  SUBJECTIVE:   SUBJECTIVE STATEMENT:  Pt reports his pain 3/10. HEP compliant and doing well. Is thinking about going back to pool to resume walking.   Pt reports he received HHPT for about 2 weeks. He notes seeing Dr.  Poli last week and the staples were removed. He is using cold packs to manage the pain and swelling.  PERTINENT HISTORY: R THA 2018, high BMI, DM  PAIN:  Are you having pain? Yes: NPRS scale: Current: 3/10 Pain location: L hip Pain description: ache, sharp Aggravating factors: certain movements of the L hip Relieving factors: Cold pack, pain medication, muscle relaxors  PRECAUTIONS: None  RED FLAGS: None   WEIGHT BEARING RESTRICTIONS: No  FALLS:  Has patient fallen in last 6 months? No  LIVING ENVIRONMENT: Lives with: lives alone Lives in: House/apartment Stairs: No Has following equipment at home: Walker - 4 wheeled, bed side commode, and Grab bars  OCCUPATION: Disability  PLOF: Independent  PATIENT GOALS: Get back to walking, and [facility] [employer]  NEXT MD VISIT: 12/23/23 Dr. Poli  OBJECTIVE:  Note: Objective measures were completed at Evaluation unless otherwise noted.  PATIENT SURVEYS:  LEFS: 35/80=44%  LEFS: 70/80 = 87.5%  COGNITION: Overall cognitive status: Within functional limits for tasks assessed     SENSATION: WFL  EDEMA:   Swelling L hip  POSTURE: flexed trunk  and flexed kness  PALPATION: TTP to the peri- hip area  LOWER EXTREMITY ROM:  Active ROM (reassessed 01/13/24) Right eval Left eval  Hip flexion  90d, same  Hip extension    Hip abduction    Hip adduction    Hip internal rotation    Hip external rotation    Knee flexion    Knee extension 15-20d lacking, same 15-20d lacking, same  Ankle  dorsiflexion    Ankle plantarflexion    Ankle inversion    Ankle eversion     (Blank rows = not tested)  LOWER EXTREMITY MMT:  MMT (reassessed 01/13/24) Right eval Left eval  Hip flexion  2+ p, 4-  Hip extension  2+ p, 3  Hip abduction  2+ p, 3  Hip adduction    Hip internal rotation    Hip external rotation  3 p  Knee flexion    Knee extension    Ankle dorsiflexion    Ankle plantarflexion    Ankle  inversion    Ankle eversion    Strength limited by pain  (Blank rows = not tested)  FUNCTIONAL TESTS:  5 times sit to stand: 18.8 2 minute walk test: 280  Reassessed 01/13/24 5x STS: 13.45 s 2 MWT: 480'  GAIT: Distance walked: 150' Assistive device utilized: Environmental Consultant - 4 wheeled Level of assistance: Modified independence Comments: Decreased pace, forward flexed trunk and kness  [facility] Adult PT Treatment:                                                DATE: 01/15/24 Therapeutic Exercise: Nustep L5 x 7 minutes  6 inch step ups left x 10 Standing lumbar extension 5 sec x 10 SLR 10 x 2 Bridge x 10  SL hip abduction x 8 left  SL clam AROM x 8 left Consider leg press , knee machines  Therapeutic Exercise: Nustep L5 x 7 minutes  6 inch step ups left x 10 Standing lumbar extension 5 sec x 10 SLR 10 x 2 Bridge x 10  SL hip abduction x 8 left  SL clam AROM x 8 left Consider leg press , knee machines   Therapeutic Activity: Gait with SMQC focusing on upright posture STS x 10, with 10# 2 x 10   Manual Therapy: *** Neuromuscular re-ed: *** Therapeutic Activity: *** Modalities: *** Self Care: ***  Brandon Finley Adult PT Treatment:  01/13/24 Therapeutic Activity Progress note performed Nustep x59min : 480 ft (not using cane) 5xsts: 13.45s Progress Note                                                 DATE: 01/07/24 Therapeutic Exercise: Nustep L5 x 7 minutes  6 inch step ups left x 10 Standing lumbar extension 5 sec x 10 SLR 10 x 2 Bridge x 10  SL hip abduction x 8 left  SL clam AROM x 8 left Consider leg press , knee machines   Therapeutic Activity: Gait with SMQC focusing on upright posture STS x 10, with 10# 2 x 10   PATIENT EDUCATION:  Education details: Eval findings, POC, HEP, self care  Person educated: Patient Education method: Explanation, Demonstration, Tactile cues, Verbal cues, and Handouts Education comprehension: verbalized understanding,  returned demonstration, verbal cues required, and tactile cues required  HOME EXERCISE PROGRAM: Access Code: [email or web address] URL: [email or web address] Date: 12/04/2023 Prepared by: Brandon Finley  Exercises BLue TB clamshells 2x6x3s Blue TB marches 2x6x3s Blue TB HS curl 2x8x3s STS 2x6 SLR 2x6x3s    ASSESSMENT:  CLINICAL IMPRESSION: Progress note performed today. Patient tolerated treatment with no increases in pain with progressions in functional activity tolerance (walking without  cane). Patient has met some goals and is making good progress in therapy. Current deficits include: isolated L hip strength, excessive pain, and functional strength/balance. As a result, patient would continue to benefit from skilled PT to address said deficits via plan below.     Patient is a 63 y.o. male who was seen today for physical therapy evaluation and treatment for Z96.642 (ICD-10-CM) - Status post total replacement of left hip. Pt presents with the following deficits which are anticipated following a THA: decreased L hip flexion; decreased bilat knee ext; decreased L hip strength, and a decreased level of function, needing a RW for assist to walk. A HEP and self care was provided. Pt will benefit from skilled PT 2w8 to address impairments to optimize L hip/LE function and mobility.   OBJECTIVE IMPAIRMENTS: decreased activity tolerance, decreased balance, difficulty walking, decreased ROM, decreased strength, increased edema, postural dysfunction, pain, and high BMi.   ACTIVITY LIMITATIONS: carrying, lifting, bending, sitting, standing, squatting, sleeping, stairs, and locomotion level  PARTICIPATION LIMITATIONS: meal prep, cleaning, laundry, shopping, and community activity  PERSONAL FACTORS: Past/current experiences, Social background, and 1-2 comorbidities: high BMI, DM are also affecting patient's functional outcome.   REHAB POTENTIAL: Good  CLINICAL DECISION MAKING: Evolving/moderate  complexity  EVALUATION COMPLEXITY: Moderate   GOALS:  SHORT TERM GOALS: Target date: 12/27/23 Pt will be Ind in an initial HEP  Baseline: started Goal status: MET  LONG TERM GOALS: Target date: 02/07/24  Pt will be Ind in a final HEP to maintain achieved LOF Baseline: started Goal status: MET  2.  Increase L hip to 4/10 for improved function Baseline:  01/07/24: 2-3/10 Goal status: MET   3.  Increase L hip AROM to 100d for appropriate ability with dressing, asc/dsc steps, and sit to/from standing Baseline: PROM to 90d Goal status: in progress  4.  Increase bilat knee ext to lacking 10d for improved gait quality, walking with a more upright posture Baseline: lacking 15-20d Goal status: in progress  5.  Improve 5xSTS by MCID of 5 and by MCID of 14ft as indication of improved functional mobility  Baseline: 5xSTS=18.8; to be assessed when pt is able to walk with a [facility] 01/01/24: 2MWT= 280 s AD Goal status: MET  6.  Pt's LEFS score will improve by the MCID to 59% as indication of improved function  Baseline: 44%  Goal status: MET  7.  Pt will Ind with walking for 500 and to asc/dsc 12 steps c a HR for community mobility Baseline: 150' c RW Goal status: in progress   PLAN:  PT FREQUENCY: 2x/week  PT DURATION: 8 weeks  PLANNED INTERVENTIONS: 97164- PT Re-evaluation, 97110-Therapeutic exercises, 97530- Therapeutic activity, 97112- Neuromuscular re-education, 97535- Self Care, 02859- Manual therapy, [phone]- Gait training, [phone]- Aquatic Therapy, [phone]- Electrical stimulation (unattended), Patient/Family education, Balance training, Stair training, Taping, Joint mobilization, Cryotherapy, and Moist heat  PLAN FOR NEXT SESSION: Assess response to HEP; progress therex as indicated; use of modalities, manual therapy as indicated. Progress proximal L hip strengthening via isolated and functional activities.   Francois Elk MS, PT 01/14/2024 8:21 AM     "

## 2024-01-15 ENCOUNTER — Ambulatory Visit

## 2024-01-15 DIAGNOSIS — M25552 Pain in left hip: Secondary | ICD-10-CM | POA: Diagnosis not present

## 2024-01-15 DIAGNOSIS — M6281 Muscle weakness (generalized): Secondary | ICD-10-CM

## 2024-01-15 DIAGNOSIS — R262 Difficulty in walking, not elsewhere classified: Secondary | ICD-10-CM

## 2024-01-15 DIAGNOSIS — R600 Localized edema: Secondary | ICD-10-CM

## 2024-01-21 NOTE — Therapy (Signed)
 " OUTPATIENT PHYSICAL THERAPY LOWER EXTREMITY TREATMENT  Reporting Period 11/19 to 12/29  See note below for Objective Data and Assessment of Progress/Goals.    Patient Name: Brandon Finley MRN: [id number] DOB:1960/09/01, 64 y.o., male Today's Date: 01/23/2024  END OF SESSION:  PT End of Session - 01/23/24 1117     Visit Number 13    Number of Visits 17    Date for Recertification  02/07/24    Authorization Type [employer] DUAL COMPLETE; MEDICAID OF [place]    PT Start Time 1105    PT Stop Time 1150    PT Time Calculation (min) 45 min    Activity Tolerance Patient tolerated treatment well    Behavior During Therapy WFL for tasks assessed/performed                    Past Medical History:  Diagnosis Date   Arthritis    Diabetes mellitus without complication (HCC)    Hypertension    Pneumonia    when pt was 64 years old   Past Surgical History:  Procedure Laterality Date   COLONOSCOPY     TOTAL HIP ARTHROPLASTY Right 03/13/2016   Procedure: RIGHT TOTAL HIP ARTHROPLASTY ANTERIOR APPROACH;  Surgeon: [name], MD;  Location: [facility];  Service: Orthopedics;  Laterality: Right;   TOTAL HIP ARTHROPLASTY Left 11/12/2023   Procedure: ARTHROPLASTY, HIP, TOTAL, ANTERIOR APPROACH;  Surgeon: Poli Lonni CINDERELLA, MD;  Location: [facility];  Service: Orthopedics;  Laterality: Left;   Patient Active Problem List   Diagnosis Date Noted   Unilateral primary osteoarthritis, left hip 01/24/2022   Hav (hallux abducto valgus), unspecified laterality 07/05/2021   Hyperlipidemia 07/10/2018   Hypertension 06/22/2016   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   Post-traumatic osteoarthritis of right hip 09/14/2015   Right hip pain 09/14/2015   Type 2 diabetes mellitus (HCC) 07/25/2012   Morbid obesity (HCC) 07/25/2012    PCP: Poli Lonni CINDERELLA, MD  REFERRING PROVIDER: Poli Lonni CINDERELLA, MD  REFERRING DIAG: [phone] (ICD-10-CM) - Status  post total replacement of left hip   THERAPY DIAG:  Pain in left hip  Muscle weakness (generalized)  Difficulty in walking, not elsewhere classified  Localized edema  Rationale for Evaluation and Treatment: Rehabilitation  ONSET DATE: 11/12/23  SUBJECTIVE:   SUBJECTIVE STATEMENT:  Pt reports he is using the [facility] in the community as needed.  PERTINENT HISTORY: R THA 2018, high BMI, DM  PAIN:  Are you having pain? Yes: NPRS scale: Current: 2/10 Pain location: L hip Pain description: ache, sharp Aggravating factors: certain movements of the L hip Relieving factors: Cold pack, pain medication, muscle relaxors  PRECAUTIONS: None  RED FLAGS: None   WEIGHT BEARING RESTRICTIONS: No  FALLS:  Has patient fallen in last 6 months? No  LIVING ENVIRONMENT: Lives with: lives alone Lives in: House/apartment Stairs: No Has following equipment at home: Walker - 4 wheeled, bed side commode, and Grab bars  OCCUPATION: Disability  PLOF: Independent  PATIENT GOALS: Get back to walking, and [facility] [employer]  NEXT MD VISIT: 12/23/23 Dr. Poli  OBJECTIVE:  Note: Objective measures were completed at Evaluation unless otherwise noted.  PATIENT SURVEYS:  LEFS: 35/80=44%  LEFS: 70/80 = 87.5%  COGNITION: Overall cognitive status: Within functional limits for tasks assessed     SENSATION: WFL  EDEMA:   Swelling L hip  POSTURE: flexed trunk  and flexed kness  PALPATION: TTP to the peri- hip  area  LOWER EXTREMITY ROM:  Active ROM (reassessed 01/13/24) Right eval Left eval  Hip flexion  90d, same  Hip extension    Hip abduction    Hip adduction    Hip internal rotation    Hip external rotation    Knee flexion    Knee extension 15-20d lacking, same 15-20d lacking, same  Ankle dorsiflexion    Ankle plantarflexion    Ankle inversion    Ankle eversion     (Blank rows = not tested)  LOWER EXTREMITY MMT:  MMT (reassessed 01/13/24)  Right eval Left eval  Hip flexion  2+ p, 4-  Hip extension  2+ p, 3  Hip abduction  2+ p, 3  Hip adduction    Hip internal rotation    Hip external rotation  3 p  Knee flexion    Knee extension    Ankle dorsiflexion    Ankle plantarflexion    Ankle inversion    Ankle eversion    Strength limited by pain  (Blank rows = not tested)  FUNCTIONAL TESTS:  5 times sit to stand: 18.8 2 minute walk test: 280  Reassessed 01/13/24 5x STS: 13.45 s 2 MWT: 480'  GAIT: Distance walked: 150' Assistive device utilized: Environmental Consultant - 4 wheeled Level of assistance: Modified independence Comments: Decreased pace, forward flexed trunk and knees  [facility] Adult PT Treatment:                                                DATE: 01/23/24 Therapeutic Exercise: Standing lumbar extension 5 sec x 10 Standing heel raises/toes raises 2x10 LAQ 5# 3x10 each  Therapeutic Activity: Nustep L7 x 7 minutes UE/LE Gait without SMQC focusing on upright posture. Pt walked 370' without catching the toes of his R foot. FWB and side steps short huddles STS  2 x 10 from bari-mat Standing hip abd 5# 2x10 each Standing hip extension 5# 2x10  [facility] Adult PT Treatment:                                                DATE: 01/15/24 Therapeutic Exercise: Nustep L7 x 7 minutes  Standing heel raises/toes raises Standing hip abd 2x10 each Standing lumbar extension 5 sec x 10 Leg press L LE 1x10 20#; 2x10 40# Therapeutic Activity: Gait without SMQC focusing on upright posture. Pt walked 370' without catching the toes of his R foot. STS  2 x 10 on airex Self Care: Weaning process from [facility]  PATIENT EDUCATION:  Education details: Eval findings, POC, HEP, self care  Person educated: Patient Education method: Explanation, Demonstration, Tactile cues, Verbal cues, and Handouts Education comprehension: verbalized understanding, returned demonstration, verbal cues required, and tactile cues required  HOME EXERCISE  PROGRAM: Access Code: [email or web address] URL: [email or web address] Date: 12/04/2023 Prepared by: Dasie Daft  Exercises BLue TB clamshells 2x6x3s Blue TB marches 2x6x3s Blue TB HS curl 2x8x3s STS 2x6 SLR 2x6x3s    ASSESSMENT:  CLINICAL IMPRESSION: PT was completed for LE ROM, strengthening and functional mobility. Continued activities working on single leg stance and stability, and for pt to be able to walk and stand more upright. Bilat knee ext AROM has improved. Without SBQC, pt is able to walk with foot  clearance bilat. Pt continues to make functional gains,  Patient is a 64 y.o. male who was seen today for physical therapy evaluation and treatment for Z96.642 (ICD-10-CM) - Status post total replacement of left hip. Pt presents with the following deficits which are anticipated following a THA: decreased L hip flexion; decreased bilat knee ext; decreased L hip strength, and a decreased level of function, needing a RW for assist to walk. A HEP and self care was provided. Pt will benefit from skilled PT 2w8 to address impairments to optimize L hip/LE function and mobility.   OBJECTIVE IMPAIRMENTS: decreased activity tolerance, decreased balance, difficulty walking, decreased ROM, decreased strength, increased edema, postural dysfunction, pain, and high BMi.   ACTIVITY LIMITATIONS: carrying, lifting, bending, sitting, standing, squatting, sleeping, stairs, and locomotion level  PARTICIPATION LIMITATIONS: meal prep, cleaning, laundry, shopping, and community activity  PERSONAL FACTORS: Past/current experiences, Social background, and 1-2 comorbidities: high BMI, DM are also affecting patient's functional outcome.   REHAB POTENTIAL: Good  CLINICAL DECISION MAKING: Evolving/moderate complexity  EVALUATION COMPLEXITY: Moderate   GOALS:  SHORT TERM GOALS: Target date: 12/27/23 Pt will be Ind in an initial HEP  Baseline: started Goal status: MET  LONG TERM GOALS: Target  date: 02/07/24  Pt will be Ind in a final HEP to maintain achieved LOF Baseline: started Goal status: MET  2.  Increase L hip to 4/10 for improved function Baseline:  01/07/24: 2-3/10 Goal status: MET   3.  Increase L hip AROM to 100d for appropriate ability with dressing, asc/dsc steps, and sit to/from standing Baseline: PROM to 90d 01/23/24: AROM 90d Goal status: MET  4.  Increase bilat knee ext to lacking 10d for improved gait quality, walking with a more upright posture Baseline: lacking 15-20d 01/23/24: lacking 8d Goal status: MET  5.  Improve 5xSTS by MCID of 5 and by MCID of 60ft as indication of improved functional mobility  Baseline: 5xSTS=18.8; to be assessed when pt is able to walk with a [facility] 01/01/24: 2MWT= 280 s AD Goal status: MET  6.  Pt's LEFS score will improve by the MCID to 59% as indication of improved function  Baseline: 44% Goal status: MET  7.  Pt will Ind with walking for 500 and to asc/dsc 12 steps c a HR for community mobility Baseline: 150' c RW Goal status: in progress   PLAN:  PT FREQUENCY: 2x/week  PT DURATION: 8 weeks  PLANNED INTERVENTIONS: 97164- PT Re-evaluation, 97110-Therapeutic exercises, 97530- Therapeutic activity, 97112- Neuromuscular re-education, 97535- Self Care, 02859- Manual therapy, [phone]- Gait training, [phone]- Aquatic Therapy, [phone]- Electrical stimulation (unattended), Patient/Family education, Balance training, Stair training, Taping, Joint mobilization, Cryotherapy, and Moist heat  PLAN FOR NEXT SESSION: Assess response to HEP; progress therex as indicated; use of modalities, manual therapy as indicated. Progress proximal L hip strengthening via isolated and functional activities.   Zenab Gronewold MS, PT 01/23/2024 1:32 PM     "

## 2024-01-22 ENCOUNTER — Ambulatory Visit: Admitting: Podiatry

## 2024-01-23 ENCOUNTER — Ambulatory Visit: Attending: Orthopaedic Surgery

## 2024-01-23 DIAGNOSIS — M25552 Pain in left hip: Secondary | ICD-10-CM | POA: Diagnosis present

## 2024-01-23 DIAGNOSIS — R262 Difficulty in walking, not elsewhere classified: Secondary | ICD-10-CM | POA: Diagnosis present

## 2024-01-23 DIAGNOSIS — M6281 Muscle weakness (generalized): Secondary | ICD-10-CM | POA: Diagnosis present

## 2024-01-23 DIAGNOSIS — R600 Localized edema: Secondary | ICD-10-CM | POA: Diagnosis present

## 2024-01-24 ENCOUNTER — Ambulatory Visit

## 2024-01-24 DIAGNOSIS — R262 Difficulty in walking, not elsewhere classified: Secondary | ICD-10-CM

## 2024-01-24 DIAGNOSIS — M25552 Pain in left hip: Secondary | ICD-10-CM | POA: Diagnosis not present

## 2024-01-24 DIAGNOSIS — M6281 Muscle weakness (generalized): Secondary | ICD-10-CM

## 2024-01-24 NOTE — Therapy (Signed)
 " OUTPATIENT PHYSICAL THERAPY LOWER EXTREMITY TREATMENT  Reporting Period 11/19 to 12/29  See note below for Objective Data and Assessment of Progress/Goals.    Patient Name: Brandon Finley MRN: [id number] DOB:1960/02/26, 64 y.o., male Today's Date: 01/24/2024  END OF SESSION:  PT End of Session - 01/24/24 1108     Visit Number 14    Number of Visits 17    Date for Recertification  02/07/24    Authorization Type [employer] DUAL COMPLETE; MEDICAID OF [place]    PT Start Time 1100    PT Stop Time 1140    PT Time Calculation (min) 40 min    Activity Tolerance Patient tolerated treatment well    Behavior During Therapy WFL for tasks assessed/performed                     Past Medical History:  Diagnosis Date   Arthritis    Diabetes mellitus without complication (HCC)    Hypertension    Pneumonia    when pt was 64 years old   Past Surgical History:  Procedure Laterality Date   COLONOSCOPY     TOTAL HIP ARTHROPLASTY Right 03/13/2016   Procedure: RIGHT TOTAL HIP ARTHROPLASTY ANTERIOR APPROACH;  Surgeon: [name], MD;  Location: [facility];  Service: Orthopedics;  Laterality: Right;   TOTAL HIP ARTHROPLASTY Left 11/12/2023   Procedure: ARTHROPLASTY, HIP, TOTAL, ANTERIOR APPROACH;  Surgeon: Poli Lonni CINDERELLA, MD;  Location: [facility] OR;  Service: Orthopedics;  Laterality: Left;   Patient Active Problem List   Diagnosis Date Noted   Unilateral primary osteoarthritis, left hip 01/24/2022   Hav (hallux abducto valgus), unspecified laterality 07/05/2021   Hyperlipidemia 07/10/2018   Hypertension 06/22/2016   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   Post-traumatic osteoarthritis of right hip 09/14/2015   Right hip pain 09/14/2015   Type 2 diabetes mellitus (HCC) 07/25/2012   Morbid obesity (HCC) 07/25/2012    PCP: Poli Lonni CINDERELLA, MD  REFERRING PROVIDER: Poli Lonni CINDERELLA, MD  REFERRING DIAG: [phone] (ICD-10-CM) -  Status post total replacement of left hip   THERAPY DIAG:  Pain in left hip  Muscle weakness (generalized)  Difficulty in walking, not elsewhere classified  Rationale for Evaluation and Treatment: Rehabilitation  ONSET DATE: 11/12/23  SUBJECTIVE:   SUBJECTIVE STATEMENT:  No pain today. Able to go to pool and walk around. Feels good today. Took pain meds this morn.  PERTINENT HISTORY: R THA 2018, high BMI, DM  PAIN:  Are you having pain? Yes: NPRS scale: Current: 2/10 Pain location: L hip Pain description: ache, sharp Aggravating factors: certain movements of the L hip Relieving factors: Cold pack, pain medication, muscle relaxors  PRECAUTIONS: None  RED FLAGS: None   WEIGHT BEARING RESTRICTIONS: No  FALLS:  Has patient fallen in last 6 months? No  LIVING ENVIRONMENT: Lives with: lives alone Lives in: House/apartment Stairs: No Has following equipment at home: Walker - 4 wheeled, bed side commode, and Grab bars  OCCUPATION: Disability  PLOF: Independent  PATIENT GOALS: Get back to walking, and [facility] [employer]  NEXT MD VISIT: 12/23/23 Dr. Poli  OBJECTIVE:  Note: Objective measures were completed at Evaluation unless otherwise noted.  PATIENT SURVEYS:  LEFS: 35/80=44%  LEFS: 70/80 = 87.5%  COGNITION: Overall cognitive status: Within functional limits for tasks assessed     SENSATION: WFL  EDEMA:   Swelling L hip  POSTURE: flexed trunk  and flexed kness  PALPATION:  TTP to the peri- hip area  LOWER EXTREMITY ROM:  Active ROM (reassessed 01/13/24) Right eval Left eval  Hip flexion  90d, same  Hip extension    Hip abduction    Hip adduction    Hip internal rotation    Hip external rotation    Knee flexion    Knee extension 15-20d lacking, same 15-20d lacking, same  Ankle dorsiflexion    Ankle plantarflexion    Ankle inversion    Ankle eversion     (Blank rows = not tested)  LOWER EXTREMITY MMT:  MMT  (reassessed 01/13/24) Right eval Left eval  Hip flexion  2+ p, 4-  Hip extension  2+ p, 3  Hip abduction  2+ p, 3  Hip adduction    Hip internal rotation    Hip external rotation  3 p  Knee flexion    Knee extension    Ankle dorsiflexion    Ankle plantarflexion    Ankle inversion    Ankle eversion    Strength limited by pain  (Blank rows = not tested)  FUNCTIONAL TESTS:  5 times sit to stand: 18.8 2 minute walk test: 280  Reassessed 01/13/24 5x STS: 13.45 s 2 MWT: 480'  GAIT: Distance walked: 150' Assistive device utilized: Environmental Consultant - 4 wheeled Level of assistance: Modified independence Comments: Decreased pace, forward flexed trunk and knees  [facility] Adult PT Treatment: 01/24/24: Therapeutic Exercise/Activity: Hep update and reassessment Seated blue TB clamshell 2x10x3s Seated blue TB hip march 2x8x3s STS wide blue chair 2x8  Supine SLR 2x8x3s Glute bridge 2x8x3s                                                   DATE: 01/23/24 Therapeutic Exercise: Standing lumbar extension 5 sec x 10 Standing heel raises/toes raises 2x10 LAQ 5# 3x10 each  Therapeutic Activity: Nustep L7 x 7 minutes UE/LE Gait without SMQC focusing on upright posture. Pt walked 370' without catching the toes of his R foot. FWB and side steps short huddles STS  2 x 10 from bari-mat Standing hip abd 5# 2x10 each Standing hip extension 5# 2x10  [facility] Adult PT Treatment:                                                DATE: 01/15/24 Therapeutic Exercise: Nustep L7 x 7 minutes  Standing heel raises/toes raises Standing hip abd 2x10 each Standing lumbar extension 5 sec x 10 Leg press L LE 1x10 20#; 2x10 40# Therapeutic Activity: Gait without SMQC focusing on upright posture. Pt walked 370' without catching the toes of his R foot. STS  2 x 10 on airex Self Care: Weaning process from [facility]  PATIENT EDUCATION:  Education details: Eval findings, POC, HEP, self care  Person educated:  Patient Education method: Explanation, Demonstration, Tactile cues, Verbal cues, and Handouts Education comprehension: verbalized understanding, returned demonstration, verbal cues required, and tactile cues required  HOME EXERCISE PROGRAM: Access Code: [email or web address] URL: [email or web address] Date: 01/24/2024 Prepared by: Washington Scot  Exercises - Seated Hip Abduction with Resistance  - 2 x daily - 5 x weekly - 2 sets - 8 reps - 3 hold - Seated March with Resistance  - 2  x daily - 5 x weekly - 2 sets - 8 reps - 3 hold - Seated Hamstring Curls with Resistance  - 2 x daily - 5 x weekly - 2 sets - 8 reps - 3 hold - Sit to Stand with Arms Crossed  - 2 x daily - 5 x weekly - 2 sets - 8 reps - 3 hold - Supine Bridge  - 2 x daily - 5 x weekly - 2 sets - 8 reps - 3 hold - Active Straight Leg Raise with Quad Set  - 2 x daily - 5 x weekly - 2 sets - 8 reps - 3 hold    ASSESSMENT:  CLINICAL IMPRESSION: Patient tolerated treatment with no increases in pain with progressions in functional BL LE loading. Current deficits include: walking/balance, functional strength and activity tolerance. As a result, patient would continue to benefit from skilled PT to address said deficits via plan below.    Patient is a 64 y.o. male who was seen today for physical therapy evaluation and treatment for Z96.642 (ICD-10-CM) - Status post total replacement of left hip. Pt presents with the following deficits which are anticipated following a THA: decreased L hip flexion; decreased bilat knee ext; decreased L hip strength, and a decreased level of function, needing a RW for assist to walk. A HEP and self care was provided. Pt will benefit from skilled PT 2w8 to address impairments to optimize L hip/LE function and mobility.   OBJECTIVE IMPAIRMENTS: decreased activity tolerance, decreased balance, difficulty walking, decreased ROM, decreased strength, increased edema, postural dysfunction, pain, and high BMi.    ACTIVITY LIMITATIONS: carrying, lifting, bending, sitting, standing, squatting, sleeping, stairs, and locomotion level  PARTICIPATION LIMITATIONS: meal prep, cleaning, laundry, shopping, and community activity  PERSONAL FACTORS: Past/current experiences, Social background, and 1-2 comorbidities: high BMI, DM are also affecting patient's functional outcome.   REHAB POTENTIAL: Good  CLINICAL DECISION MAKING: Evolving/moderate complexity  EVALUATION COMPLEXITY: Moderate   GOALS:  SHORT TERM GOALS: Target date: 12/27/23 Pt will be Ind in an initial HEP  Baseline: started Goal status: MET  LONG TERM GOALS: Target date: 02/07/24  Pt will be Ind in a final HEP to maintain achieved LOF Baseline: started Goal status: MET  2.  Increase L hip to 4/10 for improved function Baseline:  01/07/24: 2-3/10 Goal status: MET   3.  Increase L hip AROM to 100d for appropriate ability with dressing, asc/dsc steps, and sit to/from standing Baseline: PROM to 90d 01/23/24: AROM 90d Goal status: MET  4.  Increase bilat knee ext to lacking 10d for improved gait quality, walking with a more upright posture Baseline: lacking 15-20d 01/23/24: lacking 8d Goal status: MET  5.  Improve 5xSTS by MCID of 5 and by MCID of 85ft as indication of improved functional mobility  Baseline: 5xSTS=18.8; to be assessed when pt is able to walk with a [facility] 01/01/24: 2MWT= 280 s AD Goal status: MET  6.  Pt's LEFS score will improve by the MCID to 59% as indication of improved function  Baseline: 44% Goal status: MET  7.  Pt will Ind with walking for 500 and to asc/dsc 12 steps c a HR for community mobility Baseline: 150' c RW Goal status: in progress   PLAN:  PT FREQUENCY: 2x/week  PT DURATION: 8 weeks  PLANNED INTERVENTIONS: 97164- PT Re-evaluation, 97110-Therapeutic exercises, 97530- Therapeutic activity, [id number]- Neuromuscular re-education, 97535- Self Care, 02859- Manual therapy, [id number]- Gait  training, [phone]- Aquatic Therapy, [phone]-  Electrical stimulation (unattended), Patient/Family education, Balance training, Stair training, Taping, Joint mobilization, Cryotherapy, and Moist heat  PLAN FOR NEXT SESSION: Assess response to HEP; progress therex as indicated; use of modalities, manual therapy as indicated. Progress proximal L hip strengthening via isolated and functional activities.   Washington Odessia Scot  PT, DPT      "

## 2024-01-28 ENCOUNTER — Encounter: Payer: Self-pay | Admitting: Physical Therapy

## 2024-01-28 ENCOUNTER — Ambulatory Visit: Admitting: Physical Therapy

## 2024-01-28 DIAGNOSIS — M6281 Muscle weakness (generalized): Secondary | ICD-10-CM

## 2024-01-28 DIAGNOSIS — M25552 Pain in left hip: Secondary | ICD-10-CM

## 2024-01-28 NOTE — Therapy (Signed)
 " OUTPATIENT PHYSICAL THERAPY LOWER EXTREMITY TREATMENT     Patient Name: Brandon Finley MRN: [id number] DOB:13-Mar-1960, 64 y.o., male Today's Date: 01/28/2024  END OF SESSION:  PT End of Session - 01/28/24 0934     Visit Number 15    Number of Visits 17    Date for Recertification  02/07/24    Authorization Type [employer] DUAL COMPLETE; MEDICAID OF [place]    PT Start Time 0930    PT Stop Time 1015    PT Time Calculation (min) 45 min                     Past Medical History:  Diagnosis Date   Arthritis    Diabetes mellitus without complication (HCC)    Hypertension    Pneumonia    when pt was 64 years old   Past Surgical History:  Procedure Laterality Date   COLONOSCOPY     TOTAL HIP ARTHROPLASTY Right 03/13/2016   Procedure: RIGHT TOTAL HIP ARTHROPLASTY ANTERIOR APPROACH;  Surgeon: [name], MD;  Location: [facility];  Service: Orthopedics;  Laterality: Right;   TOTAL HIP ARTHROPLASTY Left 11/12/2023   Procedure: ARTHROPLASTY, HIP, TOTAL, ANTERIOR APPROACH;  Surgeon: Poli Lonni CINDERELLA, MD;  Location: [facility];  Service: Orthopedics;  Laterality: Left;   Patient Active Problem List   Diagnosis Date Noted   Unilateral primary osteoarthritis, left hip 01/24/2022   Hav (hallux abducto valgus), unspecified laterality 07/05/2021   Hyperlipidemia 07/10/2018   Hypertension 06/22/2016   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   Post-traumatic osteoarthritis of right hip 09/14/2015   Right hip pain 09/14/2015   Type 2 diabetes mellitus (HCC) 07/25/2012   Morbid obesity (HCC) 07/25/2012    PCP: Poli Lonni CINDERELLA, MD  REFERRING PROVIDER: Poli Lonni CINDERELLA, MD  REFERRING DIAG: [phone] (ICD-10-CM) - Status post total replacement of left hip   THERAPY DIAG:  Pain in left hip  Muscle weakness (generalized)  Rationale for Evaluation and Treatment: Rehabilitation  ONSET DATE: 11/12/23  SUBJECTIVE:   SUBJECTIVE  STATEMENT:  Pain is 1-2/10. I have come a  long way. Not taking pain pills anymore. I still carry my cane around.   PERTINENT HISTORY: R THA 2018, high BMI, DM  PAIN:  Are you having pain? Yes: NPRS scale: Current: 2/10 Pain location: L hip Pain description: ache, sharp Aggravating factors: certain movements of the L hip Relieving factors: Cold pack, pain medication, muscle relaxors  PRECAUTIONS: None  RED FLAGS: None   WEIGHT BEARING RESTRICTIONS: No  FALLS:  Has patient fallen in last 6 months? No  LIVING ENVIRONMENT: Lives with: lives alone Lives in: House/apartment Stairs: No Has following equipment at home: Walker - 4 wheeled, bed side commode, and Grab bars  OCCUPATION: Disability  PLOF: Independent  PATIENT GOALS: Get back to walking, and started [facility] [employer]  NEXT MD VISIT: 12/23/23 Dr. Poli  OBJECTIVE:  Note: Objective measures were completed at Evaluation unless otherwise noted.  PATIENT SURVEYS:  LEFS: 35/80=44%  LEFS: 70/80 = 87.5%  COGNITION: Overall cognitive status: Within functional limits for tasks assessed     SENSATION: WFL  EDEMA:   Swelling L hip  POSTURE: flexed trunk  and flexed kness  PALPATION: TTP to the peri- hip area  LOWER EXTREMITY ROM:  Active ROM (reassessed 01/13/24) Right eval Left eval Left 01/07/24 Left 01/28/24  Hip flexion  90d 90 95 supine  Hip extension  Hip abduction      Hip adduction      Hip internal rotation      Hip external rotation      Knee flexion      Knee extension 15-20d lacking, same 15-20d lacking Same   Ankle dorsiflexion      Ankle plantarflexion      Ankle inversion      Ankle eversion       (Blank rows = not tested)  LOWER EXTREMITY MMT:  MMT (reassessed 01/13/24) Right eval Left eval Left 01/06/25  Hip flexion  2+ p,  4-  Hip extension  2+ p,  3  Hip abduction  2+ p,  3  Hip adduction     Hip internal rotation     Hip external rotation  3 p    Knee flexion     Knee extension     Ankle dorsiflexion     Ankle plantarflexion     Ankle inversion     Ankle eversion     Strength limited by pain  (Blank rows = not tested)  FUNCTIONAL TESTS:  5 times sit to stand: 18.8 2 minute walk test: 280  Reassessed 01/13/24 5x STS: 13.45 s 2 MWT: 480'  GAIT: Distance walked: 150' Assistive device utilized: Environmental Consultant - 4 wheeled Level of assistance: Modified independence Comments: Decreased pace, forward flexed trunk and knees  [facility] Adult PT Treatment: 01/16/24: Nustep L6 x 7 min Standing hip abdct and ext Red band 8 x 2  Standing heel / toe raises 8 x 2 T.M 4 minutes .08 of a mile avg 1.4 MPH STS 8 x 2  SLR 2 x 8  Bridge 2 x 8  Supine marching        01/24/24: Therapeutic Exercise/Activity: Hep update and reassessment Seated blue TB clamshell 2x10x3s Seated blue TB hip march 2x8x3s STS wide blue chair 2x8  Supine SLR 2x8x3s Glute bridge 2x8x3s                                                   DATE: 01/23/24 Therapeutic Exercise: Standing lumbar extension 5 sec x 10 Standing heel raises/toes raises 2x10 LAQ 5# 3x10 each  Therapeutic Activity: Nustep L7 x 7 minutes UE/LE Gait without SMQC focusing on upright posture. Pt walked 370' without catching the toes of his R foot. FWB and side steps short huddles STS  2 x 10 from bari-mat Standing hip abd 5# 2x10 each Standing hip extension 5# 2x10  [facility] Adult PT Treatment:                                                DATE: 01/15/24 Therapeutic Exercise: Nustep L7 x 7 minutes  Standing heel raises/toes raises Standing hip abd 2x10 each Standing lumbar extension 5 sec x 10 Leg press L LE 1x10 20#; 2x10 40# Therapeutic Activity: Gait without SMQC focusing on upright posture. Pt walked 370' without catching the toes of his R foot. STS  2 x 10 on airex Self Care: Weaning process from [facility]  PATIENT EDUCATION:  Education details: Eval findings, POC, HEP, self care   Person educated: Patient Education method: Explanation, Demonstration, Tactile cues, Verbal cues, and Handouts Education  comprehension: verbalized understanding, returned demonstration, verbal cues required, and tactile cues required  HOME EXERCISE PROGRAM: Access Code: [email or web address] URL: [email or web address] Date: 01/24/2024 Prepared by: Washington Scot  Exercises - Seated Hip Abduction with Resistance  - 2 x daily - 5 x weekly - 2 sets - 8 reps - 3 hold - Seated March with Resistance  - 2 x daily - 5 x weekly - 2 sets - 8 reps - 3 hold - Seated Hamstring Curls with Resistance  - 2 x daily - 5 x weekly - 2 sets - 8 reps - 3 hold - Sit to Stand with Arms Crossed  - 2 x daily - 5 x weekly - 2 sets - 8 reps - 3 hold - Supine Bridge  - 2 x daily - 5 x weekly - 2 sets - 8 reps - 3 hold - Active Straight Leg Raise with Quad Set  - 2 x daily - 5 x weekly - 2 sets - 8 reps - 3 hold Added 1/13 - Standing Hip Abduction with Resistance at Ankles and Counter Support  - 1 x daily - 7 x weekly - 2 sets - 8 reps - 3 hold - Standing hip extension  - 1 x daily - 7 x weekly - 2 sets - 8 reps - 3 hold    ASSESSMENT:  CLINICAL IMPRESSION: Patient tolerated treatment with no increases in pain with progressions in functional BL LE loading, closed chain hip strengthening and gait on treadmill. Pt reports no episodes of catching right toe recently. Carries quad cane to clinic currently.  Current deficits include: walking/balance, functional strength and activity tolerance. As a result, patient would continue to benefit from skilled PT to address said deficits via plan below.    Patient is a 64 y.o. male who was seen today for physical therapy evaluation and treatment for Z96.642 (ICD-10-CM) - Status post total replacement of left hip. Pt presents with the following deficits which are anticipated following a THA: decreased L hip flexion; decreased bilat knee ext; decreased L hip strength, and a  decreased level of function, needing a RW for assist to walk. A HEP and self care was provided. Pt will benefit from skilled PT 2w8 to address impairments to optimize L hip/LE function and mobility.   OBJECTIVE IMPAIRMENTS: decreased activity tolerance, decreased balance, difficulty walking, decreased ROM, decreased strength, increased edema, postural dysfunction, pain, and high BMi.   ACTIVITY LIMITATIONS: carrying, lifting, bending, sitting, standing, squatting, sleeping, stairs, and locomotion level  PARTICIPATION LIMITATIONS: meal prep, cleaning, laundry, shopping, and community activity  PERSONAL FACTORS: Past/current experiences, Social background, and 1-2 comorbidities: high BMI, DM are also affecting patient's functional outcome.   REHAB POTENTIAL: Good  CLINICAL DECISION MAKING: Evolving/moderate complexity  EVALUATION COMPLEXITY: Moderate   GOALS:  SHORT TERM GOALS: Target date: 12/27/23 Pt will be Ind in an initial HEP  Baseline: started Goal status: MET  LONG TERM GOALS: Target date: 02/07/24  Pt will be Ind in a final HEP to maintain achieved LOF Baseline: started Goal status: MET  2.  Increase L hip to 4/10 for improved function Baseline:  01/07/24: 2-3/10 Goal status: MET   3.  Increase L hip AROM to 100d for appropriate ability with dressing, asc/dsc steps, and sit to/from standing Baseline: PROM to 90d 01/23/24: AROM 90d 01/28/24: AROM 95, PROM 100 Goal status: ONGOING  4.  Increase bilat knee ext to lacking 10d for improved gait quality, walking with a more upright posture Baseline: lacking 15-20d 01/23/24:  lacking 8d Goal status: MET  5.  Improve 5xSTS by MCID of 5 and by MCID of 36ft as indication of improved functional mobility  Baseline: 5xSTS=18.8; to be assessed when pt is able to walk with a [facility] 01/01/24: 2MWT= 280 s AD Goal status: MET  6.  Pt's LEFS score will improve by the MCID to 59% as indication of improved function  Baseline:  44% Goal status: MET  7.  Pt will Ind with walking for 500 and to asc/dsc 12 steps c a HR for community mobility Baseline: 150' c RW Goal status: in progress   PLAN:  PT FREQUENCY: 2x/week  PT DURATION: 8 weeks  PLANNED INTERVENTIONS: 97164- PT Re-evaluation, 97110-Therapeutic exercises, 97530- Therapeutic activity, 97112- Neuromuscular re-education, 97535- Self Care, 02859- Manual therapy, [phone]- Gait training, [phone]- Aquatic Therapy, [phone]- Electrical stimulation (unattended), Patient/Family education, Balance training, Stair training, Taping, Joint mobilization, Cryotherapy, and Moist heat  PLAN FOR NEXT SESSION: hip strength, finalize HEP for DC next week    Harlene Persons, PTA 01/28/2024 12:07 PM Phone: [phone] Fax: [phone]       "

## 2024-01-30 ENCOUNTER — Encounter: Payer: Self-pay | Admitting: Physical Therapy

## 2024-01-30 ENCOUNTER — Ambulatory Visit: Admitting: Physical Therapy

## 2024-01-30 DIAGNOSIS — M25552 Pain in left hip: Secondary | ICD-10-CM

## 2024-01-30 DIAGNOSIS — M6281 Muscle weakness (generalized): Secondary | ICD-10-CM

## 2024-01-30 NOTE — Therapy (Signed)
 " OUTPATIENT PHYSICAL THERAPY LOWER EXTREMITY TREATMENT     Patient Name: Brandon Finley MRN: [id number] DOB:[birth year], 64 y.o., male Today's Date: 01/30/2024  END OF SESSION:  PT End of Session - 01/30/24 0930     Visit Number 16    Number of Visits 17    Date for Recertification  02/07/24    Authorization Type [employer] DUAL COMPLETE; MEDICAID OF [place]    PT Start Time 0930    PT Stop Time 1015    PT Time Calculation (min) 45 min                     Past Medical History:  Diagnosis Date   Arthritis    Diabetes mellitus without complication (HCC)    Hypertension    Pneumonia    when pt was 64 years old   Past Surgical History:  Procedure Laterality Date   COLONOSCOPY     TOTAL HIP ARTHROPLASTY Right 03/13/2016   Procedure: RIGHT TOTAL HIP ARTHROPLASTY ANTERIOR APPROACH;  Surgeon: [name], MD;  Location: [facility] OR;  Service: Orthopedics;  Laterality: Right;   TOTAL HIP ARTHROPLASTY Left 11/12/2023   Procedure: ARTHROPLASTY, HIP, TOTAL, ANTERIOR APPROACH;  Surgeon: Poli Lonni CINDERELLA, MD;  Location: [facility] OR;  Service: Orthopedics;  Laterality: Left;   Patient Active Problem List   Diagnosis Date Noted   Unilateral primary osteoarthritis, left hip 01/24/2022   Hav (hallux abducto valgus), unspecified laterality 07/05/2021   Hyperlipidemia 07/10/2018   Hypertension 06/22/2016   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   Post-traumatic osteoarthritis of right hip 09/14/2015   Right hip pain 09/14/2015   Type 2 diabetes mellitus (HCC) 07/25/2012   Morbid obesity (HCC) 07/25/2012    PCP: Poli Lonni CINDERELLA, MD  REFERRING PROVIDER: Poli Lonni CINDERELLA, MD  REFERRING DIAG: [phone] (ICD-10-CM) - Status post total replacement of left hip   THERAPY DIAG:  Pain in left hip  Muscle weakness (generalized)  Rationale for Evaluation and Treatment: Rehabilitation  ONSET DATE: 11/12/23  SUBJECTIVE:   SUBJECTIVE  STATEMENT:  Pain is 1-2/10. I have come a  long way. Not taking pain pills anymore. I still carry my cane around.   PERTINENT HISTORY: R THA 2018, high BMI, DM  PAIN:  Are you having pain? Yes: NPRS scale: Current: 2/10 Pain location: L hip Pain description: ache, sharp Aggravating factors: certain movements of the L hip Relieving factors: Cold pack, pain medication, muscle relaxors  PRECAUTIONS: None  RED FLAGS: None   WEIGHT BEARING RESTRICTIONS: No  FALLS:  Has patient fallen in last 6 months? No  LIVING ENVIRONMENT: Lives with: lives alone Lives in: House/apartment Stairs: No Has following equipment at home: Walker - 4 wheeled, bed side commode, and Grab bars  OCCUPATION: Disability  PLOF: Independent  PATIENT GOALS: Get back to walking, and [facility] [employer]  NEXT MD VISIT: 12/23/23 Dr. Poli  OBJECTIVE:  Note: Objective measures were completed at Evaluation unless otherwise noted.  PATIENT SURVEYS:  LEFS: 35/80=44%  LEFS: 70/80 = 87.5%  COGNITION: Overall cognitive status: Within functional limits for tasks assessed     SENSATION: WFL  EDEMA:   Swelling L hip  POSTURE: flexed trunk  and flexed kness  PALPATION: TTP to the peri- hip area  LOWER EXTREMITY ROM:  Active ROM (reassessed 01/13/24) Right eval Left eval Left 01/07/24 Left 01/28/24  Hip flexion  90d 90 95 supine  Hip extension  Hip abduction      Hip adduction      Hip internal rotation      Hip external rotation      Knee flexion      Knee extension 15-20d lacking, same 15-20d lacking Same   Ankle dorsiflexion      Ankle plantarflexion      Ankle inversion      Ankle eversion       (Blank rows = not tested)  LOWER EXTREMITY MMT:  MMT (reassessed 01/13/24) Right eval Left eval Left 01/06/25  Hip flexion  2+ p,  4-  Hip extension  2+ p,  3  Hip abduction  2+ p,  3  Hip adduction     Hip internal rotation     Hip external rotation  3 p    Knee flexion     Knee extension     Ankle dorsiflexion     Ankle plantarflexion     Ankle inversion     Ankle eversion     Strength limited by pain  (Blank rows = not tested)  FUNCTIONAL TESTS:  5 times sit to stand: 18.8 2 minute walk test: 280  Reassessed 01/13/24 5x STS: 13.45 s 2 MWT: 480'  GAIT: Distance walked: 150' Assistive device utilized: Environmental Consultant - 4 wheeled Level of assistance: Modified independence Comments: Decreased pace, forward flexed trunk and knees  [facility] Adult PT Treatment: 01/30/24: Nustep L6 x 7 minutes Standing hip abdct and ext Red band 8 x 3  .16 miles @1 .4 mph, T.M (7 minutes)  Leg press 50# x 10, 60# 2 x 10 Cybex hip 2 plates hip abduct x 8 each Cybex hip flexion 2 plates x 8 each    01/16/24: Nustep L6 x 7 min Standing hip abdct and ext Red band 8 x 2  Standing heel / toe raises 8 x 2 T.M 4 minutes .08 of a mile avg 1.4 MPH STS 8 x 2  SLR 2 x 8  Bridge 2 x 8  Supine marching        01/24/24: Therapeutic Exercise/Activity: Hep update and reassessment Seated blue TB clamshell 2x10x3s Seated blue TB hip march 2x8x3s STS wide blue chair 2x8  Supine SLR 2x8x3s Glute bridge 2x8x3s                                                   DATE: 01/23/24 Therapeutic Exercise: Standing lumbar extension 5 sec x 10 Standing heel raises/toes raises 2x10 LAQ 5# 3x10 each  Therapeutic Activity: Nustep L7 x 7 minutes UE/LE Gait without SMQC focusing on upright posture. Pt walked 370' without catching the toes of his R foot. FWB and side steps short huddles STS  2 x 10 from bari-mat Standing hip abd 5# 2x10 each Standing hip extension 5# 2x10    PATIENT EDUCATION:  Education details: Eval findings, POC, HEP, self care  Person educated: Patient Education method: Explanation, Demonstration, Tactile cues, Verbal cues, and Handouts Education comprehension: verbalized understanding, returned demonstration, verbal cues required, and tactile cues  required  HOME EXERCISE PROGRAM: Access Code: [email or web address] URL: [email or web address] Date: 01/24/2024 Prepared by: Washington Scot  Exercises - Seated Hip Abduction with Resistance  - 2 x daily - 5 x weekly - 2 sets - 8 reps - 3 hold - Seated March with Resistance  - 2 x daily -  5 x weekly - 2 sets - 8 reps - 3 hold - Seated Hamstring Curls with Resistance  - 2 x daily - 5 x weekly - 2 sets - 8 reps - 3 hold - Sit to Stand with Arms Crossed  - 2 x daily - 5 x weekly - 2 sets - 8 reps - 3 hold - Supine Bridge  - 2 x daily - 5 x weekly - 2 sets - 8 reps - 3 hold - Active Straight Leg Raise with Quad Set  - 2 x daily - 5 x weekly - 2 sets - 8 reps - 3 hold Added 1/13 - Standing Hip Abduction with Resistance at Ankles and Counter Support  - 1 x daily - 7 x weekly - 2 sets - 8 reps - 3 hold - Standing hip extension  - 1 x daily - 7 x weekly - 2 sets - 8 reps - 3 hold    ASSESSMENT:  CLINICAL IMPRESSION: Patient tolerated treatment with no increases in pain with progressions in functional BL LE loading, closed chain hip strengthening and gait on treadmill. Pt reports no episodes of catching right toe recently. Carries quad cane to clinic currently.  Current deficits include: walking/balance, functional strength and activity tolerance. As a result, patient would continue to benefit from skilled PT to address said deficits via plan below.    Patient is a 64 y.o. male who was seen today for physical therapy evaluation and treatment for Z96.642 (ICD-10-CM) - Status post total replacement of left hip. Pt presents with the following deficits which are anticipated following a THA: decreased L hip flexion; decreased bilat knee ext; decreased L hip strength, and a decreased level of function, needing a RW for assist to walk. A HEP and self care was provided. Pt will benefit from skilled PT 2w8 to address impairments to optimize L hip/LE function and mobility.   OBJECTIVE IMPAIRMENTS: decreased  activity tolerance, decreased balance, difficulty walking, decreased ROM, decreased strength, increased edema, postural dysfunction, pain, and high BMi.   ACTIVITY LIMITATIONS: carrying, lifting, bending, sitting, standing, squatting, sleeping, stairs, and locomotion level  PARTICIPATION LIMITATIONS: meal prep, cleaning, laundry, shopping, and community activity  PERSONAL FACTORS: Past/current experiences, Social background, and 1-2 comorbidities: high BMI, DM are also affecting patient's functional outcome.   REHAB POTENTIAL: Good  CLINICAL DECISION MAKING: Evolving/moderate complexity  EVALUATION COMPLEXITY: Moderate   GOALS:  SHORT TERM GOALS: Target date: 12/27/23 Pt will be Ind in an initial HEP  Baseline: started Goal status: MET  LONG TERM GOALS: Target date: 02/07/24  Pt will be Ind in a final HEP to maintain achieved LOF Baseline: started Goal status: MET  2.  Increase L hip to 4/10 for improved function Baseline:  01/07/24: 2-3/10 Goal status: MET   3.  Increase L hip AROM to 100d for appropriate ability with dressing, asc/dsc steps, and sit to/from standing Baseline: PROM to 90d 01/23/24: AROM 90d 01/28/24: AROM 95, PROM 100 Goal status: ONGOING  4.  Increase bilat knee ext to lacking 10d for improved gait quality, walking with a more upright posture Baseline: lacking 15-20d 01/23/24: lacking 8d Goal status: MET  5.  Improve 5xSTS by MCID of 5 and by MCID of 13ft as indication of improved functional mobility  Baseline: 5xSTS=18.8; to be assessed when pt is able to walk with a [facility] 01/01/24: 2MWT= 280 s AD Goal status: MET  6.  Pt's LEFS score will improve by the MCID to 59% as  indication of improved function  Baseline: 44% Goal status: MET  7.  Pt will Ind with walking for 500 and to asc/dsc 12 steps c a HR for community mobility Baseline: 150' c RW Goal status: in progress   PLAN:  PT FREQUENCY: 2x/week  PT DURATION: 8 weeks  PLANNED  INTERVENTIONS: 97164- PT Re-evaluation, 97110-Therapeutic exercises, 97530- Therapeutic activity, 97112- Neuromuscular re-education, 97535- Self Care, 02859- Manual therapy, [phone]- Gait training, [phone]- Aquatic Therapy, [phone]- Electrical stimulation (unattended), Patient/Family education, Balance training, Stair training, Taping, Joint mobilization, Cryotherapy, and Moist heat  PLAN FOR NEXT SESSION: hip strength, finalize HEP for DC next week    Harlene Persons, PTA 01/30/24 1:03 PM Phone: [phone] Fax: [phone]       "

## 2024-02-04 ENCOUNTER — Ambulatory Visit: Admitting: Physical Therapy

## 2024-02-04 DIAGNOSIS — M6281 Muscle weakness (generalized): Secondary | ICD-10-CM

## 2024-02-04 DIAGNOSIS — M25552 Pain in left hip: Secondary | ICD-10-CM | POA: Diagnosis not present

## 2024-02-04 NOTE — Therapy (Signed)
 " OUTPATIENT PHYSICAL THERAPY LOWER EXTREMITY TREATMENT     Patient Name: Brandon Finley MRN: [id number] DOB:[birth year], 64 y.o., male Today's Date: 02/04/2024  END OF SESSION:  PT End of Session - 02/04/24 0956     Visit Number 17    Number of Visits 17    Date for Recertification  02/07/24    Authorization Type [employer] DUAL COMPLETE; MEDICAID OF [place]    PT Start Time 0935    PT Stop Time 1015    PT Time Calculation (min) 40 min                      Past Medical History:  Diagnosis Date   Arthritis    Diabetes mellitus without complication (HCC)    Hypertension    Pneumonia    when pt was 64 years old   Past Surgical History:  Procedure Laterality Date   COLONOSCOPY     TOTAL HIP ARTHROPLASTY Right 03/13/2016   Procedure: RIGHT TOTAL HIP ARTHROPLASTY ANTERIOR APPROACH;  Surgeon: [name], MD;  Location: [facility] OR;  Service: Orthopedics;  Laterality: Right;   TOTAL HIP ARTHROPLASTY Left 11/12/2023   Procedure: ARTHROPLASTY, HIP, TOTAL, ANTERIOR APPROACH;  Surgeon: Poli Lonni CINDERELLA, MD;  Location: [facility] OR;  Service: Orthopedics;  Laterality: Left;   Patient Active Problem List   Diagnosis Date Noted   Unilateral primary osteoarthritis, left hip 01/24/2022   Hav (hallux abducto valgus), unspecified laterality 07/05/2021   Hyperlipidemia 07/10/2018   Hypertension 06/22/2016   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   Post-traumatic osteoarthritis of right hip 09/14/2015   Right hip pain 09/14/2015   Type 2 diabetes mellitus (HCC) 07/25/2012   Morbid obesity (HCC) 07/25/2012    PCP: Poli Lonni CINDERELLA, MD  REFERRING PROVIDER: Poli Lonni CINDERELLA, MD  REFERRING DIAG: [phone] (ICD-10-CM) - Status post total replacement of left hip   THERAPY DIAG:  Pain in left hip  Muscle weakness (generalized)  Rationale for Evaluation and Treatment: Rehabilitation  ONSET DATE: 11/12/23  SUBJECTIVE:    SUBJECTIVE STATEMENT:  Pain is 1-2/10. I have come a  long way. Not taking pain pills anymore. I still carry my cane around.   PERTINENT HISTORY: R THA 2018, high BMI, DM  PAIN:  Are you having pain? Yes: NPRS scale: Current: 2/10 Pain location: L hip Pain description: ache, sharp Aggravating factors: certain movements of the L hip Relieving factors: Cold pack, pain medication, muscle relaxors  PRECAUTIONS: None  RED FLAGS: None   WEIGHT BEARING RESTRICTIONS: No  FALLS:  Has patient fallen in last 6 months? No  LIVING ENVIRONMENT: Lives with: lives alone Lives in: House/apartment Stairs: No Has following equipment at home: Walker - 4 wheeled, bed side commode, and Grab bars  OCCUPATION: Disability  PLOF: Independent  PATIENT GOALS: Get back to walking, and started [facility] [employer]  NEXT MD VISIT: 12/23/23 Dr. Poli  OBJECTIVE:  Note: Objective measures were completed at Evaluation unless otherwise noted.  PATIENT SURVEYS:  LEFS: 35/80=44%  LEFS: 70/80 = 87.5%  COGNITION: Overall cognitive status: Within functional limits for tasks assessed     SENSATION: WFL  EDEMA:   Swelling L hip  POSTURE: flexed trunk  and flexed kness  PALPATION: TTP to the peri- hip area  LOWER EXTREMITY ROM:  Active ROM (reassessed 01/13/24) Right eval Left eval Left 01/07/24 Left 01/28/24 02/04/24  Hip flexion  90d 90 95 supine 100 bilat  Hip extension  Hip abduction       Hip adduction       Hip internal rotation       Hip external rotation       Knee flexion       Knee extension 15-20d lacking, same 15-20d lacking Same    Ankle dorsiflexion       Ankle plantarflexion       Ankle inversion       Ankle eversion        (Blank rows = not tested)  LOWER EXTREMITY MMT:  MMT (reassessed 01/13/24) Right eval Left eval Left 01/06/25  Hip flexion  2+ p,  4-  Hip extension  2+ p,  3  Hip abduction  2+ p,  3  Hip adduction     Hip internal  rotation     Hip external rotation  3 p   Knee flexion     Knee extension     Ankle dorsiflexion     Ankle plantarflexion     Ankle inversion     Ankle eversion     Strength limited by pain  (Blank rows = not tested)  FUNCTIONAL TESTS:  5 times sit to stand: 18.8 2 minute walk test: 280  Reassessed 01/13/24 5x STS: 13.45 s 2 MWT: 480'  GAIT: Distance walked: 150' Assistive device utilized: Environmental Consultant - 4 wheeled Level of assistance: Modified independence Comments: Decreased pace, forward flexed trunk and knees  [facility] Adult PT Treatment: [facility] Adult PT Treatment:                                                DATE: 02/04/24 Therapeutic Exercise: Nustep L6 x 8 min Standing hip abdct and ext green band 8 x 3 Standing March 10 x 2  Leg press 60# 10 x 3   SLR  10 x 3 each Side hip abdct 10 x 2 Supine marching   01/30/24: Nustep L6 x 7 minutes Standing hip abdct and ext Red band 8 x 3  .16 miles @1 .4 mph, T.M (7 minutes)  Leg press 50# x 10, 60# 2 x 10 Cybex hip 2 plates hip abduct x 8 each Cybex hip flexion 2 plates x 8 each    01/16/24: Nustep L6 x 7 min Standing hip abdct and ext Red band 8 x 2  Standing heel / toe raises 8 x 2 T.M 4 minutes .08 of a mile avg 1.4 MPH STS 8 x 2  SLR 2 x 8  Bridge 2 x 8  Supine marching        01/24/24: Therapeutic Exercise/Activity: Hep update and reassessment Seated blue TB clamshell 2x10x3s Seated blue TB hip march 2x8x3s STS wide blue chair 2x8  Supine SLR 2x8x3s Glute bridge 2x8x3s                                                   DATE: 01/23/24 Therapeutic Exercise: Standing lumbar extension 5 sec x 10 Standing heel raises/toes raises 2x10 LAQ 5# 3x10 each  Therapeutic Activity: Nustep L7 x 7 minutes UE/LE Gait without SMQC focusing on upright posture. Pt walked 370' without catching the toes of his R foot. FWB and side steps short huddles STS  2 x 10 from bari-mat Standing hip abd 5# 2x10 each Standing hip  extension 5# 2x10    PATIENT EDUCATION:  Education details: Eval findings, POC, HEP, self care  Person educated: Patient Education method: Explanation, Demonstration, Tactile cues, Verbal cues, and Handouts Education comprehension: verbalized understanding, returned demonstration, verbal cues required, and tactile cues required  HOME EXERCISE PROGRAM: Access Code: [email or web address] URL: [email or web address] Date: 01/24/2024 Prepared by: Washington Scot  Exercises - Seated Hip Abduction with Resistance  - 2 x daily - 5 x weekly - 2 sets - 8 reps - 3 hold - Seated March with Resistance  - 2 x daily - 5 x weekly - 2 sets - 8 reps - 3 hold - Seated Hamstring Curls with Resistance  - 2 x daily - 5 x weekly - 2 sets - 8 reps - 3 hold - Sit to Stand with Arms Crossed  - 2 x daily - 5 x weekly - 2 sets - 8 reps - 3 hold - Supine Bridge  - 2 x daily - 5 x weekly - 2 sets - 8 reps - 3 hold - Active Straight Leg Raise with Quad Set  - 2 x daily - 5 x weekly - 2 sets - 8 reps - 3 hold Added 01/28/24 - Standing Hip Abduction with Resistance at Ankles and Counter Support  - 1 x daily - 7 x weekly - 2 sets - 8 reps - 3 hold - Standing hip extension  - 1 x daily - 7 x weekly - 2 sets - 8 reps - 3 hold    ASSESSMENT:  CLINICAL IMPRESSION: Patient tolerated treatment with no increases in pain with progressions in functional BL LE loading, closed chain hip strengthening. Pt reports no episodes of catching right toe recently. Carries quad cane to clinic currently.  Current deficits include: walking/balance, functional strength and activity tolerance. As a result, patient would continue to benefit from skilled PT to address said deficits via plan below.    Patient is a 64 y.o. male who was seen today for physical therapy evaluation and treatment for Z96.642 (ICD-10-CM) - Status post total replacement of left hip. Pt presents with the following deficits which are anticipated following a THA: decreased  L hip flexion; decreased bilat knee ext; decreased L hip strength, and a decreased level of function, needing a RW for assist to walk. A HEP and self care was provided. Pt will benefit from skilled PT 2w8 to address impairments to optimize L hip/LE function and mobility.   OBJECTIVE IMPAIRMENTS: decreased activity tolerance, decreased balance, difficulty walking, decreased ROM, decreased strength, increased edema, postural dysfunction, pain, and high BMi.   ACTIVITY LIMITATIONS: carrying, lifting, bending, sitting, standing, squatting, sleeping, stairs, and locomotion level  PARTICIPATION LIMITATIONS: meal prep, cleaning, laundry, shopping, and community activity  PERSONAL FACTORS: Past/current experiences, Social background, and 1-2 comorbidities: high BMI, DM are also affecting patient's functional outcome.   REHAB POTENTIAL: Good  CLINICAL DECISION MAKING: Evolving/moderate complexity  EVALUATION COMPLEXITY: Moderate   GOALS:  SHORT TERM GOALS: Target date: 12/27/23 Pt will be Ind in an initial HEP  Baseline: started Goal status: MET  LONG TERM GOALS: Target date: 02/07/24  Pt will be Ind in a final HEP to maintain achieved LOF Baseline: started Goal status: MET  2.  Increase L hip to 4/10 for improved function Baseline:  01/07/24: 2-3/10 Goal status: MET   3.  Increase L hip AROM to 100d for appropriate ability with dressing, asc/dsc steps, and  sit to/from standing Baseline: PROM to 90d 01/23/24: AROM 90d 01/28/24: AROM 95, PROM 100 02/04/24: 100 bilat Goal status: MET  4.  Increase bilat knee ext to lacking 10d for improved gait quality, walking with a more upright posture Baseline: lacking 15-20d 01/23/24: lacking 8d Goal status: MET  5.  Improve 5xSTS by MCID of 5 and by MCID of 65ft as indication of improved functional mobility  Baseline: 5xSTS=18.8; to be assessed when pt is able to walk with a [facility] 01/01/24: 2MWT= 280 s AD Goal status: MET  6.  Pt's  LEFS score will improve by the MCID to 59% as indication of improved function  Baseline: 44% Goal status: MET  7.  Pt will Ind with walking for 500 and to asc/dsc 12 steps c a HR for community mobility Baseline: 150' c RW 02/04/24: MOD I with both Goal status: MET    PLAN:  PT FREQUENCY: 2x/week  PT DURATION: 8 weeks  PLANNED INTERVENTIONS: 97164- PT Re-evaluation, 97110-Therapeutic exercises, 97530- Therapeutic activity, 97112- Neuromuscular re-education, 97535- Self Care, 02859- Manual therapy, [phone]- Gait training, [phone]- Aquatic Therapy, [phone]- Electrical stimulation (unattended), Patient/Family education, Balance training, Stair training, Taping, Joint mobilization, Cryotherapy, and Moist heat  PLAN FOR NEXT SESSION: hip strength, finalize HEP for DC    Harlene Persons, PTA 02/04/24 10:17 AM Phone: [phone] Fax: [phone]       "

## 2024-02-06 ENCOUNTER — Ambulatory Visit: Admitting: Podiatry

## 2024-02-07 ENCOUNTER — Ambulatory Visit

## 2024-02-07 DIAGNOSIS — M25552 Pain in left hip: Secondary | ICD-10-CM | POA: Diagnosis not present

## 2024-02-07 DIAGNOSIS — M6281 Muscle weakness (generalized): Secondary | ICD-10-CM

## 2024-02-07 DIAGNOSIS — R600 Localized edema: Secondary | ICD-10-CM

## 2024-02-07 DIAGNOSIS — R262 Difficulty in walking, not elsewhere classified: Secondary | ICD-10-CM

## 2024-02-07 NOTE — Therapy (Signed)
 " OUTPATIENT PHYSICAL THERAPY LOWER EXTREMITY TREATMENT PHYSICAL THERAPY DISCHARGE SUMMARY  Visits from Start of Care: 18  Current functional level related to goals / functional outcomes: N/a   Remaining deficits: N/a   Education / Equipment: HEP   Patient agrees to discharge. Patient goals were met. Patient is being discharged due to being pleased with the current functional level.   Patient Name: Brandon Finley MRN: [id number] DOB:August 15, 1960, 64 y.o., male Today's Date: 02/13/2024  END OF SESSION:   Past Medical History:  Diagnosis Date   Arthritis    Diabetes mellitus without complication (HCC)    Hypertension    Pneumonia    when pt was 64 years old   Past Surgical History:  Procedure Laterality Date   COLONOSCOPY     TOTAL HIP ARTHROPLASTY Right 03/13/2016   Procedure: RIGHT TOTAL HIP ARTHROPLASTY ANTERIOR APPROACH;  Surgeon: [name], MD;  Location: [facility] OR;  Service: Orthopedics;  Laterality: Right;   TOTAL HIP ARTHROPLASTY Left 11/12/2023   Procedure: ARTHROPLASTY, HIP, TOTAL, ANTERIOR APPROACH;  Surgeon: Poli Lonni CINDERELLA, MD;  Location: [facility] OR;  Service: Orthopedics;  Laterality: Left;   Patient Active Problem List   Diagnosis Date Noted   Unilateral primary osteoarthritis, left hip 01/24/2022   Hav (hallux abducto valgus), unspecified laterality 07/05/2021   Hyperlipidemia 07/10/2018   Hypertension 06/22/2016   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   Post-traumatic osteoarthritis of right hip 09/14/2015   Right hip pain 09/14/2015   Type 2 diabetes mellitus (HCC) 07/25/2012   Morbid obesity (HCC) 07/25/2012    PCP: Poli Lonni CINDERELLA, MD  REFERRING PROVIDER: Poli Lonni CINDERELLA, MD  REFERRING DIAG: [phone] (ICD-10-CM) - Status post total replacement of left hip   THERAPY DIAG:  Pain in left hip  Muscle weakness (generalized)  Difficulty in walking, not elsewhere classified  Localized  edema  Rationale for Evaluation and Treatment: Rehabilitation  ONSET DATE: 11/12/23  SUBJECTIVE:   SUBJECTIVE STATEMENT: Patient reports that his pain has improved greatly since starting PT. He ambulates into PT carrying his cane and that no longer uses it. He has obtained memberships to the [facility] and the [facility] and has been attending.   PERTINENT HISTORY: R THA 2018, high BMI, DM  PAIN:  Are you having pain? Yes: NPRS scale: Current: 2/10 Pain location: L hip Pain description: ache, sharp Aggravating factors: certain movements of the L hip Relieving factors: Cold pack, pain medication, muscle relaxors  PRECAUTIONS: None  RED FLAGS: None   WEIGHT BEARING RESTRICTIONS: No  FALLS:  Has patient fallen in last 6 months? No  LIVING ENVIRONMENT: Lives with: lives alone Lives in: House/apartment Stairs: No Has following equipment at home: Walker - 4 wheeled, bed side commode, and Grab bars  OCCUPATION: Disability  PLOF: Independent  PATIENT GOALS: Get back to walking, and started being [facility] [employer]  NEXT MD VISIT: 12/23/23 Dr. Poli  OBJECTIVE:  Note: Objective measures were completed at Evaluation unless otherwise noted.  PATIENT SURVEYS:  LEFS: 35/80=44%  LEFS: 70/80 = 87.5%  COGNITION: Overall cognitive status: Within functional limits for tasks assessed     SENSATION: WFL  EDEMA:   Swelling L hip  POSTURE: flexed trunk  and flexed kness  PALPATION: TTP to the peri- hip area  LOWER EXTREMITY ROM:  Active ROM (reassessed 01/13/24) Right eval Left eval Left 01/07/24 Left 01/28/24 02/04/24  Hip flexion  90d 90 95 supine 100 bilat  Hip extension  Hip abduction       Hip adduction       Hip internal rotation       Hip external rotation       Knee flexion       Knee extension 15-20d lacking, same 15-20d lacking Same    Ankle dorsiflexion       Ankle plantarflexion       Ankle inversion       Ankle eversion        (Blank rows =  not tested)  LOWER EXTREMITY MMT:  MMT (reassessed 01/13/24) Right eval Left eval Left 01/06/25  Hip flexion  2+ p,  4-  Hip extension  2+ p,  3  Hip abduction  2+ p,  3  Hip adduction     Hip internal rotation     Hip external rotation  3 p   Knee flexion     Knee extension     Ankle dorsiflexion     Ankle plantarflexion     Ankle inversion     Ankle eversion     Strength limited by pain  (Blank rows = not tested)  FUNCTIONAL TESTS:  5 times sit to stand: 18.8 2 minute walk test: 280  Reassessed 01/13/24 5x STS: 13.45 s 2 MWT: 480'  GAIT: Distance walked: 150' Assistive device utilized: [employer] - 4 wheeled Level of assistance: Modified independence Comments: Decreased pace, forward flexed trunk and knees  [facility] Adult PT Treatment:                                                DATE: 02/07/24 Therapeutic Exercise: Nustep L6 x 8 min Therapeutic Activity: Discussion of goals, course of PT Update and review of HEP  [facility] Adult PT Treatment:                                                DATE: 02/04/24 Therapeutic Exercise: Nustep L6 x 8 min Standing hip abdct and ext green band 8 x 3 Standing March 10 x 2  Leg press 60# 10 x 3   SLR  10 x 3 each Side hip abdct 10 x 2 Supine marching   01/30/24: Nustep L6 x 7 minutes Standing hip abdct and ext Red band 8 x 3  .16 miles @1 .4 mph, T.M (7 minutes)  Leg press 50# x 10, 60# 2 x 10 Cybex hip 2 plates hip abduct x 8 each Cybex hip flexion 2 plates x 8 each    PATIENT EDUCATION:  Education details: Eval findings, POC, HEP, self care  Person educated: Patient Education method: Explanation, Demonstration, Tactile cues, Verbal cues, and Handouts Education comprehension: verbalized understanding, returned demonstration, verbal cues required, and tactile cues required  HOME EXERCISE PROGRAM: Access Code: [email or web address] URL: [email or web address] Date: 01/24/2024 Prepared by: Washington Scot  Exercises -  Seated Hip Abduction with Resistance  - 2 x daily - 5 x weekly - 2 sets - 8 reps - 3 hold - Seated March with Resistance  - 2 x daily - 5 x weekly - 2 sets - 8 reps - 3 hold - Seated Hamstring Curls with Resistance  - 2 x daily - 5 x weekly - 2 sets - 8  reps - 3 hold - Sit to Stand with Arms Crossed  - 2 x daily - 5 x weekly - 2 sets - 8 reps - 3 hold - Supine Bridge  - 2 x daily - 5 x weekly - 2 sets - 8 reps - 3 hold - Active Straight Leg Raise with Quad Set  - 2 x daily - 5 x weekly - 2 sets - 8 reps - 3 hold Added 01/28/24 - Standing Hip Abduction with Resistance at Ankles and Counter Support  - 1 x daily - 7 x weekly - 2 sets - 8 reps - 3 hold - Standing hip extension  - 1 x daily - 7 x weekly - 2 sets - 8 reps - 3 hold    ASSESSMENT:  CLINICAL IMPRESSION: Patient presents to PT reporting minimal pain and that he has come  long way since beginning PT. He has met all of his goals at this time showing improved ROM, improved strength, and functional independence. He no longer needs his cane to ambulate and is able to perform stairs with step over step pattern. He is very motivated to continue with exercises and attending the gym and aquatics. Updated and reviewed HEP. Patient is appropriate and agreeable to DC from PT at this time.   Patient is a 64 y.o. male who was seen today for physical therapy evaluation and treatment for Z96.642 (ICD-10-CM) - Status post total replacement of left hip. Pt presents with the following deficits which are anticipated following a THA: decreased L hip flexion; decreased bilat knee ext; decreased L hip strength, and a decreased level of function, needing a RW for assist to walk. A HEP and self care was provided. Pt will benefit from skilled PT 2w8 to address impairments to optimize L hip/LE function and mobility.   OBJECTIVE IMPAIRMENTS: decreased activity tolerance, decreased balance, difficulty walking, decreased ROM, decreased strength, increased edema, postural  dysfunction, pain, and high BMi.   ACTIVITY LIMITATIONS: carrying, lifting, bending, sitting, standing, squatting, sleeping, stairs, and locomotion level  PARTICIPATION LIMITATIONS: meal prep, cleaning, laundry, shopping, and community activity  PERSONAL FACTORS: Past/current experiences, Social background, and 1-2 comorbidities: high BMI, DM are also affecting patient's functional outcome.   REHAB POTENTIAL: Good  CLINICAL DECISION MAKING: Evolving/moderate complexity  EVALUATION COMPLEXITY: Moderate   GOALS:  SHORT TERM GOALS: Target date: 12/27/23 Pt will be Ind in an initial HEP  Baseline: started Goal status: MET  LONG TERM GOALS: Target date: 02/07/24  Pt will be Ind in a final HEP to maintain achieved LOF Baseline: started Goal status: MET  2.  Increase L hip to 4/10 for improved function Baseline:  01/07/24: 2-3/10 Goal status: MET   3.  Increase L hip AROM to 100d for appropriate ability with dressing, asc/dsc steps, and sit to/from standing Baseline: PROM to 90d 01/23/24: AROM 90d 01/28/24: AROM 95, PROM 100 02/04/24: 100 bilat Goal status: MET  4.  Increase bilat knee ext to lacking 10d for improved gait quality, walking with a more upright posture Baseline: lacking 15-20d 01/23/24: lacking 8d Goal status: MET  5.  Improve 5xSTS by MCID of 5 and by MCID of 64ft as indication of improved functional mobility  Baseline: 5xSTS=18.8; to be assessed when pt is able to walk with a [facility] 01/01/24: 2MWT= 280 s AD Goal status: MET  6.  Pt's LEFS score will improve by the MCID to 59% as indication of improved function  Baseline: 44% Goal status: MET  7.  Pt will Ind with walking for 500 and to asc/dsc 12 steps c a HR for community mobility Baseline: 150' c RW 02/04/24: MOD I with both Goal status: MET   PLAN:  PT FREQUENCY: 2x/week  PT DURATION: 8 weeks  PLANNED INTERVENTIONS: 97164- PT Re-evaluation, 97110-Therapeutic exercises, 97530- Therapeutic  activity, 97112- Neuromuscular re-education, 97535- Self Care, [id number]- Manual therapy, [phone]- Gait training, [phone]- Aquatic Therapy, [phone]- Electrical stimulation (unattended), Patient/Family education, Balance training, Stair training, Taping, Joint mobilization, Cryotherapy, and Moist heat     Corean Pouch PTA  02/13/24 3:22 PM Phone: [phone] Fax: [phone]   Washington Odessia Scot  PT, DPT   "

## 2024-02-24 ENCOUNTER — Ambulatory Visit: Admitting: Podiatry

## 2024-03-23 ENCOUNTER — Ambulatory Visit: Admitting: Orthopaedic Surgery
